# Patient Record
Sex: Female | Born: 1950 | Race: White | Hispanic: No | Marital: Married | Smoking: Never smoker
Health system: Southern US, Community
[De-identification: ages and names within clinical notes are randomized; demographics above are authoritative.]

## PROBLEM LIST (undated history)

## (undated) DIAGNOSIS — Z9889 Other specified postprocedural states: Secondary | ICD-10-CM

## (undated) DIAGNOSIS — K649 Unspecified hemorrhoids: Secondary | ICD-10-CM

## (undated) DIAGNOSIS — M1990 Unspecified osteoarthritis, unspecified site: Secondary | ICD-10-CM

## (undated) DIAGNOSIS — N200 Calculus of kidney: Secondary | ICD-10-CM

## (undated) DIAGNOSIS — R112 Nausea with vomiting, unspecified: Secondary | ICD-10-CM

## (undated) DIAGNOSIS — E7800 Pure hypercholesterolemia, unspecified: Secondary | ICD-10-CM

## (undated) DIAGNOSIS — I499 Cardiac arrhythmia, unspecified: Secondary | ICD-10-CM

## (undated) DIAGNOSIS — E559 Vitamin D deficiency, unspecified: Secondary | ICD-10-CM

## (undated) DIAGNOSIS — T4145XA Adverse effect of unspecified anesthetic, initial encounter: Secondary | ICD-10-CM

## (undated) DIAGNOSIS — K5790 Diverticulosis of intestine, part unspecified, without perforation or abscess without bleeding: Secondary | ICD-10-CM

## (undated) DIAGNOSIS — R739 Hyperglycemia, unspecified: Secondary | ICD-10-CM

## (undated) DIAGNOSIS — T8859XA Other complications of anesthesia, initial encounter: Secondary | ICD-10-CM

## (undated) DIAGNOSIS — R600 Localized edema: Secondary | ICD-10-CM

## (undated) DIAGNOSIS — M810 Age-related osteoporosis without current pathological fracture: Secondary | ICD-10-CM

## (undated) DIAGNOSIS — Z973 Presence of spectacles and contact lenses: Secondary | ICD-10-CM

## (undated) DIAGNOSIS — M51369 Other intervertebral disc degeneration, lumbar region without mention of lumbar back pain or lower extremity pain: Secondary | ICD-10-CM

## (undated) DIAGNOSIS — M5136 Other intervertebral disc degeneration, lumbar region: Secondary | ICD-10-CM

## (undated) DIAGNOSIS — E039 Hypothyroidism, unspecified: Secondary | ICD-10-CM

## (undated) DIAGNOSIS — K219 Gastro-esophageal reflux disease without esophagitis: Secondary | ICD-10-CM

## (undated) DIAGNOSIS — M353 Polymyalgia rheumatica: Secondary | ICD-10-CM

## (undated) DIAGNOSIS — Z974 Presence of external hearing-aid: Secondary | ICD-10-CM

## (undated) DIAGNOSIS — Z87442 Personal history of urinary calculi: Secondary | ICD-10-CM

## (undated) DIAGNOSIS — L309 Dermatitis, unspecified: Secondary | ICD-10-CM

## (undated) HISTORY — PX: COLONOSCOPY: SHX174

## (undated) HISTORY — PX: TONSILLECTOMY: SUR1361

## (undated) HISTORY — PX: ESOPHAGOGASTRODUODENOSCOPY: SHX1529

## (undated) HISTORY — PX: APPENDECTOMY: SHX54

## (undated) HISTORY — PX: BACK SURGERY: SHX140

## (undated) HISTORY — PX: BLEPHAROPLASTY: SUR158

## (undated) HISTORY — PX: VEIN LIGATION: SHX2652

## (undated) HISTORY — DX: Dermatitis, unspecified: L30.9

---

## 1984-01-02 HISTORY — PX: ABDOMINAL HYSTERECTOMY: SHX81

## 1987-01-02 HISTORY — PX: LUMBAR LAMINECTOMY: SHX95

## 2004-09-27 ENCOUNTER — Ambulatory Visit: Payer: Self-pay | Admitting: Internal Medicine

## 2005-10-02 ENCOUNTER — Ambulatory Visit: Payer: Self-pay

## 2005-10-02 ENCOUNTER — Ambulatory Visit: Payer: Self-pay | Admitting: Internal Medicine

## 2005-11-05 ENCOUNTER — Ambulatory Visit: Payer: Self-pay | Admitting: Pain Medicine

## 2005-11-05 ENCOUNTER — Ambulatory Visit: Payer: Self-pay | Admitting: Internal Medicine

## 2005-11-06 ENCOUNTER — Ambulatory Visit: Payer: Self-pay | Admitting: Pain Medicine

## 2005-11-20 ENCOUNTER — Ambulatory Visit: Payer: Self-pay | Admitting: Pain Medicine

## 2005-11-27 ENCOUNTER — Ambulatory Visit: Payer: Self-pay | Admitting: Physician Assistant

## 2005-12-04 ENCOUNTER — Ambulatory Visit: Payer: Self-pay | Admitting: Pain Medicine

## 2005-12-17 ENCOUNTER — Ambulatory Visit: Payer: Self-pay | Admitting: Pain Medicine

## 2006-10-08 ENCOUNTER — Ambulatory Visit: Payer: Self-pay | Admitting: Internal Medicine

## 2007-03-13 ENCOUNTER — Ambulatory Visit: Payer: Self-pay | Admitting: Internal Medicine

## 2007-10-09 ENCOUNTER — Ambulatory Visit: Payer: Self-pay

## 2008-03-17 ENCOUNTER — Ambulatory Visit: Payer: Self-pay | Admitting: Internal Medicine

## 2008-07-27 ENCOUNTER — Ambulatory Visit: Payer: Self-pay | Admitting: Pain Medicine

## 2008-08-10 ENCOUNTER — Ambulatory Visit: Payer: Self-pay | Admitting: Physician Assistant

## 2008-08-24 ENCOUNTER — Ambulatory Visit: Payer: Self-pay | Admitting: Pain Medicine

## 2008-09-07 ENCOUNTER — Ambulatory Visit: Payer: Self-pay | Admitting: Physician Assistant

## 2008-10-05 ENCOUNTER — Other Ambulatory Visit: Payer: Self-pay | Admitting: Internal Medicine

## 2008-10-12 ENCOUNTER — Ambulatory Visit: Payer: Self-pay

## 2009-02-01 ENCOUNTER — Ambulatory Visit: Payer: Self-pay | Admitting: Rheumatology

## 2009-04-05 ENCOUNTER — Other Ambulatory Visit: Payer: Self-pay | Admitting: Internal Medicine

## 2009-10-11 ENCOUNTER — Other Ambulatory Visit: Payer: Self-pay | Admitting: Internal Medicine

## 2009-11-23 ENCOUNTER — Ambulatory Visit: Payer: Self-pay | Admitting: Internal Medicine

## 2009-11-23 ENCOUNTER — Ambulatory Visit: Payer: Self-pay

## 2010-04-17 ENCOUNTER — Other Ambulatory Visit: Payer: Self-pay | Admitting: Internal Medicine

## 2010-11-01 ENCOUNTER — Ambulatory Visit: Payer: Self-pay | Admitting: Internal Medicine

## 2010-11-29 ENCOUNTER — Ambulatory Visit: Payer: Self-pay | Admitting: Internal Medicine

## 2011-02-13 ENCOUNTER — Ambulatory Visit: Payer: Self-pay | Admitting: Rheumatology

## 2011-02-13 LAB — COMPREHENSIVE METABOLIC PANEL
Albumin: 4.1 g/dL (ref 3.4–5.0)
Anion Gap: 4 — ABNORMAL LOW (ref 7–16)
BUN: 17 mg/dL (ref 7–18)
Bilirubin,Total: 0.4 mg/dL (ref 0.2–1.0)
Calcium, Total: 9.2 mg/dL (ref 8.5–10.1)
Co2: 30 mmol/L (ref 21–32)
EGFR (African American): >60
Glucose: 88 mg/dL (ref 65–99)
Osmolality: 279 (ref 275–301)
Potassium: 4.1 mmol/L (ref 3.5–5.1)
SGOT(AST): 25 U/L (ref 15–37)
Total Protein: 7.4 g/dL (ref 6.4–8.2)

## 2011-02-13 LAB — CBC WITH DIFFERENTIAL/PLATELET
Basophil #: 0 10*3/uL (ref 0.0–0.1)
Basophil %: 0.5 %
Eosinophil #: 0.3 10*3/uL (ref 0.0–0.7)
Eosinophil %: 4.4 %
HCT: 42.1 % (ref 35.0–47.0)
HGB: 14 g/dL (ref 12.0–16.0)
Lymphocyte #: 1.8 10*3/uL (ref 1.0–3.6)
Lymphocyte %: 32.1 %
MCH: 31.4 pg (ref 26.0–34.0)
MCHC: 33.3 g/dL (ref 32.0–36.0)
Monocyte #: 0.7 10*3/uL (ref 0.0–0.7)
Neutrophil #: 2.9 10*3/uL (ref 1.4–6.5)
RBC: 4.47 10*6/uL (ref 3.80–5.20)

## 2011-02-13 LAB — SEDIMENTATION RATE: Erythrocyte Sed Rate: 11 mm/hr (ref 0–30)

## 2011-12-05 ENCOUNTER — Ambulatory Visit: Payer: Self-pay | Admitting: Internal Medicine

## 2011-12-06 ENCOUNTER — Ambulatory Visit: Payer: Self-pay | Admitting: Rheumatology

## 2012-01-02 HISTORY — PX: BILATERAL CARPAL TUNNEL RELEASE: SHX6508

## 2012-10-27 ENCOUNTER — Emergency Department: Payer: Self-pay | Admitting: Emergency Medicine

## 2012-10-27 LAB — COMPREHENSIVE METABOLIC PANEL
Alkaline Phosphatase: 102 U/L (ref 50–136)
Anion Gap: 11 (ref 7–16)
Calcium, Total: 9.2 mg/dL (ref 8.5–10.1)
Chloride: 107 mmol/L (ref 98–107)
EGFR (African American): 58 — ABNORMAL LOW
EGFR (Non-African Amer.): 50 — ABNORMAL LOW
Potassium: 3.5 mmol/L (ref 3.5–5.1)
SGOT(AST): 29 U/L (ref 15–37)
SGPT (ALT): 25 U/L (ref 12–78)

## 2012-10-27 LAB — URINALYSIS, COMPLETE
Bilirubin,UR: NEGATIVE
Glucose,UR: NEGATIVE mg/dL (ref 0–75)
Nitrite: NEGATIVE
Ph: 5 (ref 4.5–8.0)
Protein: NEGATIVE

## 2012-10-27 LAB — CBC
HCT: 43.4 % (ref 35.0–47.0)
HGB: 14.7 g/dL (ref 12.0–16.0)
MCH: 31.3 pg (ref 26.0–34.0)
MCV: 93 fL (ref 80–100)
Platelet: 262 10*3/uL (ref 150–440)
RBC: 4.69 10*6/uL (ref 3.80–5.20)
RDW: 13.5 % (ref 11.5–14.5)
WBC: 9.6 10*3/uL (ref 3.6–11.0)

## 2012-10-27 LAB — LIPASE, BLOOD: Lipase: 125 U/L (ref 73–393)

## 2012-11-06 ENCOUNTER — Ambulatory Visit: Payer: Self-pay | Admitting: Urology

## 2012-11-09 ENCOUNTER — Emergency Department: Payer: Self-pay | Admitting: Emergency Medicine

## 2012-11-09 LAB — COMPREHENSIVE METABOLIC PANEL
Alkaline Phosphatase: 131 U/L (ref 50–136)
BUN: 19 mg/dL — ABNORMAL HIGH (ref 7–18)
Bilirubin,Total: 0.4 mg/dL (ref 0.2–1.0)
Calcium, Total: 9.2 mg/dL (ref 8.5–10.1)
Chloride: 105 mmol/L (ref 98–107)
Co2: 27 mmol/L (ref 21–32)
Creatinine: 1.19 mg/dL (ref 0.60–1.30)
EGFR (Non-African Amer.): 49 — ABNORMAL LOW
Glucose: 147 mg/dL — ABNORMAL HIGH (ref 65–99)
Osmolality: 281 (ref 275–301)
Potassium: 3.7 mmol/L (ref 3.5–5.1)
SGOT(AST): 29 U/L (ref 15–37)
SGPT (ALT): 33 U/L (ref 12–78)
Total Protein: 7.8 g/dL (ref 6.4–8.2)

## 2012-11-09 LAB — URINALYSIS, COMPLETE
Glucose,UR: NEGATIVE mg/dL (ref 0–75)
Leukocyte Esterase: NEGATIVE
Nitrite: NEGATIVE
Ph: 5 (ref 4.5–8.0)
Specific Gravity: 1.014 (ref 1.003–1.030)
Squamous Epithelial: 2
WBC UR: 13 /HPF (ref 0–5)

## 2012-11-09 LAB — CBC WITH DIFFERENTIAL/PLATELET
Basophil #: 0 10*3/uL (ref 0.0–0.1)
Basophil %: 0.3 %
Eosinophil #: 0 10*3/uL (ref 0.0–0.7)
HCT: 40.7 % (ref 35.0–47.0)
Lymphocyte #: 1.1 10*3/uL (ref 1.0–3.6)
MCH: 30.5 pg (ref 26.0–34.0)
Monocyte #: 0.6 x10 3/mm (ref 0.2–0.9)
Platelet: 394 10*3/uL (ref 150–440)
RBC: 4.47 10*6/uL (ref 3.80–5.20)
RDW: 13.1 % (ref 11.5–14.5)

## 2012-12-09 ENCOUNTER — Ambulatory Visit: Payer: Self-pay | Admitting: Internal Medicine

## 2013-05-05 ENCOUNTER — Ambulatory Visit: Payer: Self-pay | Admitting: Internal Medicine

## 2013-06-16 ENCOUNTER — Ambulatory Visit: Payer: Self-pay

## 2013-12-28 ENCOUNTER — Ambulatory Visit: Payer: Self-pay | Admitting: Gastroenterology

## 2014-01-01 HISTORY — PX: BLEPHAROPLASTY: SUR158

## 2014-03-02 ENCOUNTER — Ambulatory Visit: Payer: Self-pay | Admitting: Specialist

## 2014-03-02 HISTORY — PX: THUMB ARTHROSCOPY: SHX2509

## 2014-04-06 ENCOUNTER — Ambulatory Visit
Admit: 2014-04-06 | Disposition: A | Discharge status: Home / Self Care | Payer: Self-pay | Attending: Specialist | Admitting: Specialist

## 2014-04-26 ENCOUNTER — Other Ambulatory Visit: Payer: Self-pay | Admitting: Orthopedic Surgery

## 2014-05-02 NOTE — Op Note (Signed)
PATIENT NAME:  Karen Ramirez, Karen Ramirez MR#:  [id number] DATE OF BIRTH:  [birth year]  DATE OF PROCEDURE:  03/02/2014  PREOPERATIVE DIAGNOSIS: Severe degenerative arthritis, carpometacarpal joint, base of left thumb.  POSTOPERATIVE DIAGNOSIS: Severe degenerative arthritis, carpometacarpal joint, base of left thumb.  PROCEDURE PERFORMED: Excisional trapezium arthroplasty, left thumb.   SURGEON: Christophe Louis, M.D.   ANESTHESIA: General.   COMPLICATIONS: None.   TOURNIQUET TIME: 63 minutes.   DESCRIPTION OF PROCEDURE: 2 grams of Ancef were given intravenously prior to the procedure. General anesthesia is induced. The left upper extremity is thoroughly prepped with alcohol and ChloraPrep and draped in standard sterile fashion. The extremity is wrapped out with the Esmarch bandage and pneumatic tourniquet elevated to 250 mmHg. Median nerve block is performed at the wrist using 0.5% plain Marcaine.  Under loupe magnification, standard dorsal incision is made over the first metacarpocarpal joint and the dissection carefully carried down to the dorsal capsule.  Cutaneous nerves and tendons are respected at all times. A longitudinal incision is made in the dorsal capsule and this is carefully reflected to each side and preserved for later repair. Using the dental bur and the rongeur, complete excision of the trapezium was then performed. Careful palpation demonstrates no residual pieces of bone. The wound is thoroughly irrigated multiple times. Three small pieces of Gelfoam are then compressed into the shape of a trapezium and sewn together using 4-0 Mersilene. This was then secured into the hole made by the excision of the trapezium using the 4-0 Mersilene through the flexor tendon and the base of the wound. Subcutaneous tissue was then infiltrated with 0.5% plain Marcaine. The dorsal capsule is then meticulously repaired with 4-0 Mersilene. One subcutaneous 5-0 Vicryl suture is placed and then the skin is  closed with a running subcuticular 3-0 Prolene. Soft bulky dressing with a thumb spica splint is applied. The tourniquet is released. The patient is returned to the recovery room in satisfactory condition having tolerated the procedure quite well.   ____________________________ Lucas Mallow, MD ces:mc D: 03/03/2014 08:03:18 ET T: 03/03/2014 10:34:29 ET JOB#: [id number]  cc: Lucas Mallow, MD, <Dictator> Lucas Mallow MD ELECTRONICALLY SIGNED 03/13/2014 10:06

## 2014-05-05 ENCOUNTER — Encounter (HOSPITAL_BASED_OUTPATIENT_CLINIC_OR_DEPARTMENT_OTHER): Payer: Self-pay | Admitting: *Deleted

## 2014-05-05 NOTE — Progress Notes (Signed)
No cardiac or resp problems

## 2014-05-10 ENCOUNTER — Ambulatory Visit (HOSPITAL_BASED_OUTPATIENT_CLINIC_OR_DEPARTMENT_OTHER)
Admission: RE | Admit: 2014-05-10 | Discharge: 2014-05-10 | Disposition: A | Discharge status: Home / Self Care | Payer: 59 | Source: Ambulatory Visit | Attending: Orthopedic Surgery | Admitting: Orthopedic Surgery

## 2014-05-10 ENCOUNTER — Ambulatory Visit (HOSPITAL_BASED_OUTPATIENT_CLINIC_OR_DEPARTMENT_OTHER): Payer: 59 | Admitting: Anesthesiology

## 2014-05-10 ENCOUNTER — Encounter (HOSPITAL_BASED_OUTPATIENT_CLINIC_OR_DEPARTMENT_OTHER)
Admission: RE | Disposition: A | Discharge status: Home / Self Care | Payer: Self-pay | Source: Ambulatory Visit | Attending: Orthopedic Surgery

## 2014-05-10 ENCOUNTER — Encounter (HOSPITAL_BASED_OUTPATIENT_CLINIC_OR_DEPARTMENT_OTHER): Payer: Self-pay | Admitting: Anesthesiology

## 2014-05-10 DIAGNOSIS — Z90710 Acquired absence of both cervix and uterus: Secondary | ICD-10-CM | POA: Diagnosis not present

## 2014-05-10 DIAGNOSIS — M25511 Pain in right shoulder: Secondary | ICD-10-CM | POA: Diagnosis present

## 2014-05-10 DIAGNOSIS — M94211 Chondromalacia, right shoulder: Secondary | ICD-10-CM | POA: Diagnosis not present

## 2014-05-10 DIAGNOSIS — M5136 Other intervertebral disc degeneration, lumbar region: Secondary | ICD-10-CM | POA: Diagnosis not present

## 2014-05-10 DIAGNOSIS — S46211A Strain of muscle, fascia and tendon of other parts of biceps, right arm, initial encounter: Secondary | ICD-10-CM | POA: Insufficient documentation

## 2014-05-10 DIAGNOSIS — F1099 Alcohol use, unspecified with unspecified alcohol-induced disorder: Secondary | ICD-10-CM | POA: Insufficient documentation

## 2014-05-10 DIAGNOSIS — Z888 Allergy status to other drugs, medicaments and biological substances status: Secondary | ICD-10-CM | POA: Diagnosis not present

## 2014-05-10 DIAGNOSIS — Z881 Allergy status to other antibiotic agents status: Secondary | ICD-10-CM | POA: Diagnosis not present

## 2014-05-10 DIAGNOSIS — E039 Hypothyroidism, unspecified: Secondary | ICD-10-CM | POA: Insufficient documentation

## 2014-05-10 DIAGNOSIS — M65811 Other synovitis and tenosynovitis, right shoulder: Secondary | ICD-10-CM | POA: Diagnosis not present

## 2014-05-10 DIAGNOSIS — M75101 Unspecified rotator cuff tear or rupture of right shoulder, not specified as traumatic: Secondary | ICD-10-CM | POA: Insufficient documentation

## 2014-05-10 DIAGNOSIS — E780 Pure hypercholesterolemia: Secondary | ICD-10-CM | POA: Insufficient documentation

## 2014-05-10 DIAGNOSIS — K219 Gastro-esophageal reflux disease without esophagitis: Secondary | ICD-10-CM | POA: Insufficient documentation

## 2014-05-10 DIAGNOSIS — M19011 Primary osteoarthritis, right shoulder: Secondary | ICD-10-CM | POA: Insufficient documentation

## 2014-05-10 HISTORY — PX: SHOULDER ARTHROSCOPY WITH DISTAL CLAVICLE RESECTION: SHX5675

## 2014-05-10 HISTORY — PX: SHOULDER ARTHROSCOPY WITH BICEPSTENOTOMY: SHX6204

## 2014-05-10 HISTORY — DX: Hypothyroidism, unspecified: E03.9

## 2014-05-10 HISTORY — DX: Pure hypercholesterolemia, unspecified: E78.00

## 2014-05-10 HISTORY — DX: Presence of spectacles and contact lenses: Z97.3

## 2014-05-10 HISTORY — PX: SHOULDER ARTHROSCOPY WITH SUBACROMIAL DECOMPRESSION: SHX5684

## 2014-05-10 HISTORY — DX: Other intervertebral disc degeneration, lumbar region without mention of lumbar back pain or lower extremity pain: M51.369

## 2014-05-10 HISTORY — DX: Gastro-esophageal reflux disease without esophagitis: K21.9

## 2014-05-10 HISTORY — DX: Other intervertebral disc degeneration, lumbar region: M51.36

## 2014-05-10 HISTORY — DX: Unspecified osteoarthritis, unspecified site: M19.90

## 2014-05-10 LAB — POCT HEMOGLOBIN-HEMACUE: HEMOGLOBIN: 15.4 g/dL — AB (ref 12.0–15.0)

## 2014-05-10 SURGERY — SHOULDER ARTHROSCOPY WITH DISTAL CLAVICLE RESECTION
Anesthesia: General | Site: Shoulder | Laterality: Right

## 2014-05-10 MED ORDER — PROPOFOL 10 MG/ML IV BOLUS
INTRAVENOUS | Status: DC | PRN
  Administered 2014-05-10: 200 mg via INTRAVENOUS

## 2014-05-10 MED ORDER — CEFAZOLIN SODIUM-DEXTROSE 2-3 GM-% IV SOLR
INTRAVENOUS | Status: AC
  Filled 2014-05-10: qty 50

## 2014-05-10 MED ORDER — DOCUSATE SODIUM 100 MG PO CAPS: 100.0000 mg | ORAL_CAPSULE | Freq: Three times a day (TID) | ORAL | Status: DC | PRN

## 2014-05-10 MED ORDER — BUPIVACAINE-EPINEPHRINE (PF) 0.5% -1:200000 IJ SOLN
INTRAMUSCULAR | Status: DC | PRN
  Administered 2014-05-10: 23 mL via PERINEURAL

## 2014-05-10 MED ORDER — ACETAMINOPHEN 325 MG PO TABS
ORAL_TABLET | ORAL | Status: AC
  Filled 2014-05-10: qty 1

## 2014-05-10 MED ORDER — CEFAZOLIN SODIUM-DEXTROSE 2-3 GM-% IV SOLR
2.0000 g | INTRAVENOUS | Status: AC
  Administered 2014-05-10: 2 g via INTRAVENOUS

## 2014-05-10 MED ORDER — MEPERIDINE HCL 25 MG/ML IJ SOLN: 6.2500 mg | INTRAMUSCULAR | Status: DC | PRN

## 2014-05-10 MED ORDER — SUCCINYLCHOLINE CHLORIDE 20 MG/ML IJ SOLN
INTRAMUSCULAR | Status: DC | PRN
  Administered 2014-05-10: 100 mg via INTRAVENOUS

## 2014-05-10 MED ORDER — MIDAZOLAM HCL 2 MG/2ML IJ SOLN
INTRAMUSCULAR | Status: AC
  Filled 2014-05-10: qty 2

## 2014-05-10 MED ORDER — DEXAMETHASONE SODIUM PHOSPHATE 4 MG/ML IJ SOLN
INTRAMUSCULAR | Status: DC | PRN
Start: 2014-05-10 — End: 2014-05-10
  Administered 2014-05-10: 10 mg via INTRAVENOUS

## 2014-05-10 MED ORDER — ACETAMINOPHEN 325 MG PO TABS
325.0000 mg | ORAL_TABLET | Freq: Once | ORAL | Status: AC
  Administered 2014-05-10: 325 mg via ORAL

## 2014-05-10 MED ORDER — LACTATED RINGERS IV SOLN
INTRAVENOUS | Status: DC
  Administered 2014-05-10 (×2): via INTRAVENOUS

## 2014-05-10 MED ORDER — FENTANYL CITRATE (PF) 100 MCG/2ML IJ SOLN
INTRAMUSCULAR | Status: AC
  Filled 2014-05-10: qty 2

## 2014-05-10 MED ORDER — FENTANYL CITRATE (PF) 100 MCG/2ML IJ SOLN
INTRAMUSCULAR | Status: AC
  Filled 2014-05-10: qty 6

## 2014-05-10 MED ORDER — FENTANYL CITRATE (PF) 100 MCG/2ML IJ SOLN
50.0000 ug | INTRAMUSCULAR | Status: DC | PRN
  Administered 2014-05-10: 100 ug via INTRAVENOUS

## 2014-05-10 MED ORDER — ONDANSETRON HCL 4 MG/2ML IJ SOLN
INTRAMUSCULAR | Status: DC | PRN
  Administered 2014-05-10: 4 mg via INTRAVENOUS

## 2014-05-10 MED ORDER — HYDROMORPHONE HCL 1 MG/ML IJ SOLN: 0.2500 mg | INTRAMUSCULAR | Status: DC | PRN

## 2014-05-10 MED ORDER — OXYCODONE-ACETAMINOPHEN 5-325 MG PO TABS: 1.0000 | ORAL_TABLET | ORAL | Status: DC | PRN

## 2014-05-10 MED ORDER — POVIDONE-IODINE 7.5 % EX SOLN: Freq: Once | CUTANEOUS | Status: DC

## 2014-05-10 MED ORDER — MIDAZOLAM HCL 2 MG/2ML IJ SOLN
1.0000 mg | INTRAMUSCULAR | Status: DC | PRN
  Administered 2014-05-10: 2 mg via INTRAVENOUS

## 2014-05-10 SURGICAL SUPPLY — 78 items
BENZOIN TINCTURE PRP APPL 2/3 (GAUZE/BANDAGES/DRESSINGS) IMPLANT
BLADE CLIPPER SURG (BLADE) IMPLANT
BLADE SURG 15 STRL LF DISP TIS (BLADE) IMPLANT
BLADE SURG 15 STRL SS (BLADE)
BUR OVAL 4.0 (BURR) ×2 IMPLANT
CANNULA 5.75X71 LONG (CANNULA) ×2 IMPLANT
CANNULA TWIST IN 8.25X7CM (CANNULA) IMPLANT
CHLORAPREP W/TINT 26ML (MISCELLANEOUS) ×2 IMPLANT
DECANTER SPIKE VIAL GLASS SM (MISCELLANEOUS) IMPLANT
DRAPE INCISE IOBAN 66X45 STRL (DRAPES) ×2 IMPLANT
DRAPE STERI 35X30 U-POUCH (DRAPES) ×2 IMPLANT
DRAPE SURG 17X23 STRL (DRAPES) ×2 IMPLANT
DRAPE U 20/CS (DRAPES) ×2 IMPLANT
DRAPE U-SHAPE 47X51 STRL (DRAPES) ×2 IMPLANT
DRAPE U-SHAPE 76X120 STRL (DRAPES) ×4 IMPLANT
DRSG PAD ABDOMINAL 8X10 ST (GAUZE/BANDAGES/DRESSINGS) ×2 IMPLANT
ELECT REM PT RETURN 9FT ADLT (ELECTROSURGICAL) ×2
ELECTRODE REM PT RTRN 9FT ADLT (ELECTROSURGICAL) ×1 IMPLANT
GAUZE SPONGE 4X4 12PLY STRL (GAUZE/BANDAGES/DRESSINGS) ×2 IMPLANT
GAUZE SPONGE 4X4 16PLY XRAY LF (GAUZE/BANDAGES/DRESSINGS) IMPLANT
GAUZE XEROFORM 1X8 LF (GAUZE/BANDAGES/DRESSINGS) ×2 IMPLANT
GLOVE BIO SURGEON STRL SZ 6.5 (GLOVE) ×2 IMPLANT
GLOVE BIO SURGEON STRL SZ7 (GLOVE) ×2 IMPLANT
GLOVE BIO SURGEON STRL SZ7.5 (GLOVE) ×4 IMPLANT
GLOVE BIOGEL PI IND STRL 7.0 (GLOVE) ×2 IMPLANT
GLOVE BIOGEL PI IND STRL 8 (GLOVE) ×2 IMPLANT
GLOVE BIOGEL PI INDICATOR 7.0 (GLOVE) ×2
GLOVE BIOGEL PI INDICATOR 8 (GLOVE) ×2
GOWN STRL REUS W/ TWL LRG LVL3 (GOWN DISPOSABLE) ×2 IMPLANT
GOWN STRL REUS W/ TWL XL LVL3 (GOWN DISPOSABLE) ×1 IMPLANT
GOWN STRL REUS W/TWL LRG LVL3 (GOWN DISPOSABLE) ×2
GOWN STRL REUS W/TWL XL LVL3 (GOWN DISPOSABLE) ×1
LASSO CRESCENT QUICKPASS (SUTURE) IMPLANT
LIQUID BAND (GAUZE/BANDAGES/DRESSINGS) IMPLANT
MANIFOLD NEPTUNE II (INSTRUMENTS) ×2 IMPLANT
NDL SUT 6 .5 CRC .975X.05 MAYO (NEEDLE) IMPLANT
NEEDLE 1/2 CIR CATGUT .05X1.09 (NEEDLE) IMPLANT
NEEDLE MAYO TAPER (NEEDLE)
NEEDLE SCORPION MULTI FIRE (NEEDLE) IMPLANT
NS IRRIG 1000ML POUR BTL (IV SOLUTION) IMPLANT
PACK ARTHROSCOPY DSU (CUSTOM PROCEDURE TRAY) ×2 IMPLANT
PACK BASIN DAY SURGERY FS (CUSTOM PROCEDURE TRAY) ×2 IMPLANT
PENCIL BUTTON HOLSTER BLD 10FT (ELECTRODE) IMPLANT
RESECTOR FULL RADIUS 4.2MM (BLADE) ×2 IMPLANT
SHEET MEDIUM DRAPE 40X70 STRL (DRAPES) IMPLANT
SLEEVE SCD COMPRESS KNEE MED (MISCELLANEOUS) ×2 IMPLANT
SLING ARM IMMOBILIZER MED (SOFTGOODS) IMPLANT
SLING ARM LRG ADULT FOAM STRAP (SOFTGOODS) ×2 IMPLANT
SLING ARM MED ADULT FOAM STRAP (SOFTGOODS) IMPLANT
SLING ARM XL FOAM STRAP (SOFTGOODS) IMPLANT
SPONGE LAP 4X18 X RAY DECT (DISPOSABLE) IMPLANT
STRIP CLOSURE SKIN 1/2X4 (GAUZE/BANDAGES/DRESSINGS) IMPLANT
SUCTION FRAZIER TIP 10 FR DISP (SUCTIONS) IMPLANT
SUPPORT WRAP ARM LG (MISCELLANEOUS) IMPLANT
SUT BONE WAX W31G (SUTURE) IMPLANT
SUT ETHIBOND 2 OS 4 DA (SUTURE) IMPLANT
SUT ETHILON 3 0 PS 1 (SUTURE) ×2 IMPLANT
SUT ETHILON 4 0 PS 2 18 (SUTURE) IMPLANT
SUT FIBERWIRE #2 38 T-5 BLUE (SUTURE)
SUT MNCRL AB 3-0 PS2 18 (SUTURE) IMPLANT
SUT MNCRL AB 4-0 PS2 18 (SUTURE) IMPLANT
SUT PDS AB 0 CT 36 (SUTURE) IMPLANT
SUT PROLENE 3 0 PS 2 (SUTURE) IMPLANT
SUT TIGER TAPE 7 IN WHITE (SUTURE) IMPLANT
SUT VIC AB 0 CT1 27 (SUTURE)
SUT VIC AB 0 CT1 27XBRD ANBCTR (SUTURE) IMPLANT
SUT VIC AB 2-0 SH 27 (SUTURE)
SUT VIC AB 2-0 SH 27XBRD (SUTURE) IMPLANT
SUTURE FIBERWR #2 38 T-5 BLUE (SUTURE) IMPLANT
SYR BULB 3OZ (MISCELLANEOUS) IMPLANT
TAPE FIBER 2MM 7IN #2 BLUE (SUTURE) IMPLANT
TOWEL OR 17X24 6PK STRL BLUE (TOWEL DISPOSABLE) ×2 IMPLANT
TOWEL OR NON WOVEN STRL DISP B (DISPOSABLE) ×2 IMPLANT
TUBE CONNECTING 20X1/4 (TUBING) ×2 IMPLANT
TUBING ARTHROSCOPY IRRIG 16FT (MISCELLANEOUS) ×2 IMPLANT
WAND STAR VAC 90 (SURGICAL WAND) ×2 IMPLANT
WATER STERILE IRR 1000ML POUR (IV SOLUTION) ×2 IMPLANT
YANKAUER SUCT BULB TIP NO VENT (SUCTIONS) IMPLANT

## 2014-05-10 NOTE — Anesthesia Procedure Notes (Addendum)
Anesthesia Regional Block:  Interscalene brachial plexus block  Pre-Anesthetic Checklist: ,, timeout performed, Correct Patient, Correct Site, Correct Laterality, Correct Procedure, Correct Position, site marked, Risks and benefits discussed,  Surgical consent,  Pre-op evaluation,  At surgeon's request and post-op pain management  Laterality: Right and Upper  Prep: chloraprep       Needles:  Injection technique: Single-shot  Needle Type: Echogenic Needle     Needle Length: 5cm 5 cm Needle Gauge: 21 and 21 G    Additional Needles:  Procedures: ultrasound guided (picture in chart) Interscalene brachial plexus block Narrative:  Start time: 05/10/2014 12:16 PM End time: 05/10/2014 12:20 PM Injection made incrementally with aspirations every 5 mL.  Performed by: Personally  Anesthesiologist: CREWS, DAVID   Procedure Name: Intubation Date/Time: 05/10/2014 12:35 PM Performed by: Lyndee Leo Pre-anesthesia Checklist: Patient identified, Emergency Drugs available, Suction available and Patient being monitored Patient Re-evaluated:Patient Re-evaluated prior to inductionOxygen Delivery Method: Circle System Utilized Preoxygenation: Pre-oxygenation with 100% oxygen Intubation Type: IV induction Ventilation: Mask ventilation without difficulty Laryngoscope Size: Mac and 3 Grade View: Grade II Tube type: Oral Tube size: 7.0 mm Number of attempts: 1 Airway Equipment and Method: Stylet and Oral airway Placement Confirmation: ETT inserted through vocal cords under direct vision,  positive ETCO2 and breath sounds checked- equal and bilateral Tube secured with: Tape Dental Injury: Teeth and Oropharynx as per pre-operative assessment

## 2014-05-10 NOTE — Op Note (Addendum)
Procedure(s): SHOULDER ARTHROSCOPY WITH DISTAL CLAVICLE RESECTION DEBRIDEMENT OF ROTATOR CUFF TEAR SHOULDER ARTHROSCOPY WITH BICEPSTENOTOMY SHOULDER ARTHROSCOPY WITH SUBACROMIAL DECOMPRESSION Procedure Note  Karen Ramirez female 64 y.o. 05/10/2014  Procedure(s) and Anesthesia Type:  #1 right shoulder arthroscopic extensive debridement of irreparable rotator cuff tear, biceps tendon tear with tenotomy #2 right shoulder arthroscopic subacromial decompression #3 right shoulder arthroscopic distal clavicle excision  Surgeon(s) and Role:    * Tania Ade, MD - Primary     Surgeon: Nita Sells   Assistants: Jeanmarie Hubert PA-C (Danielle was present and scrubbed throughout the procedure and was essential in positioning, assisting with the camera and instrumentation,, and closure)  Anesthesia: General endotracheal anesthesia with preoperative interscalene block given by the attending anesthesiologist     Procedure Detail   Estimated Blood Loss: Min         Drains: none  Blood Given: none         Specimens: none        Complications:  * No complications entered in OR log *         Disposition: PACU - hemodynamically stable.         Condition: stable    Procedure:   INDICATIONS FOR SURGERY: The patient is 63 y.o. female who has a history of right shoulder pain and dysfunction which is failed conservative management. She has a large irreparable rotator cuff tear and severe synovitis in the joint. She also has symptoms AC joint DJD. She wished to go forward with surgery understanding risks benefits alternatives to the procedure.  OPERATIVE FINDINGS: Examination under anesthesia: No significant stiffness or instability.   DESCRIPTION OF PROCEDURE: The patient was identified in preoperative  holding area where I personally marked the operative site after  verifying site, side, and procedure with the patient. An interscalene block was given by the  attending anesthesiologist the holding area.  The patient was taken back to the operating room where general anesthesia was induced without complication and was placed in the beach-chair position with the back  elevated about 60 degrees and all extremities and head and neck carefully padded and  positioned.   The right upper extremity was then prepped and  draped in a standard sterile fashion. The appropriate time-out  procedure was carried out. The patient did receive IV antibiotics  within 30 minutes of incision.   A small posterior portal incision was made and the arthroscope was introduced into the joint. An anterior portal was then established above the subscapularis using needle localization. Small cannula was placed anteriorly. Diagnostic arthroscopy was then carried out.  She was noted to have severe synovitis throughout the joint space with a large amount of intra-articular fluid. The subscapularis was completely intact and there was some intact infraspinatus but the supraspinatus was completely torn and retracted past the level of the glenoid. The biceps tendon was severely torn proximally involving greater than 50% of the thickness of the tendon and the superior labrum was also torn from its insertion on the superior glenoid. Therefore a large biter was used to release the proximal long head biceps tendon and shaver was used to extensively debride the stump. The ArthroCare was used to extensively debride the synovitis in the joint anterior posterior and superiorly. Glenohumeral joint surfaces were noted to have mild degenerative changes with grade 1 and 2 chondromalacia.  The arthroscope was then introduced into the subacromial space a standard lateral portal was established with needle localization.   The tuberosity was noted to  have a fair amount of residual tendon stump remaining and this was extensively debrided with the shaver and the ArthroCare. Tuberosity was smoothed slightly. The  residual rotator cuff edge was debrided. There is no good definable tendon remaining. The undersurface of the acromion was debrided with the ArthriCare and anterior edge was then smoothed with the bur where a small spur was noted.  The distal clavicle was exposed arthroscopically and the bur was used to take off the undersurface for approximately 8 mm from the lateral portal. The bur was then moved to an anterior portal position to complete the distal clavicle excision resecting about 8 mm of the distal clavicle and a smooth even fashion. This was viewed from anterior and lateral portals and felt to be complete.  The arthroscopic equipment was removed from the joint and the portals were closed with 3-0 nylon in an interrupted fashion. Sterile dressings were then applied including Xeroform 4 x 4's ABDs and tape. The patient was then allowed to awaken from general anesthesia, placed in a sling, transferred to the stretcher and taken to the recovery room in stable condition.   POSTOPERATIVE PLAN: The patient will be discharged home today and will followup in one week for suture removal and wound check.  We will get her into physical therapy right away.

## 2014-05-10 NOTE — Anesthesia Preprocedure Evaluation (Signed)
Anesthesia Evaluation  Patient identified by MRN, date of birth, ID band Patient awake    Reviewed: Allergy & Precautions, NPO status , Patient's Chart, lab work & pertinent test results  Airway Mallampati: I  TM Distance: >3 FB Neck ROM: Full    Dental  (+) Teeth Intact, Dental Advisory Given   Pulmonary  breath sounds clear to auscultation        Cardiovascular Rhythm:Regular Rate:Normal     Neuro/Psych    GI/Hepatic GERD-  Medicated and Controlled,  Endo/Other  Hypothyroidism   Renal/GU      Musculoskeletal   Abdominal   Peds  Hematology   Anesthesia Other Findings   Reproductive/Obstetrics                             Anesthesia Physical Anesthesia Plan  ASA: II  Anesthesia Plan: General   Post-op Pain Management:    Induction: Intravenous  Airway Management Planned: Oral ETT  Additional Equipment:   Intra-op Plan:   Post-operative Plan: Extubation in OR  Informed Consent: I have reviewed the patients History and Physical, chart, labs and discussed the procedure including the risks, benefits and alternatives for the proposed anesthesia with the patient or authorized representative who has indicated his/her understanding and acceptance.   Dental advisory given  Plan Discussed with: CRNA, Anesthesiologist and Surgeon  Anesthesia Plan Comments:         Anesthesia Quick Evaluation

## 2014-05-10 NOTE — H&P (Signed)
Karen Ramirez is an 64 y.o. female.   Chief Complaint: R shoulder pain and dysfunction HPI: R shoulder pain and weakness with massive rotator cuff tear failed nonop tx with activity modification, medication and injections.  Past Medical History  Diagnosis Date  . Hypothyroidism   . GERD (gastroesophageal reflux disease)   . Arthritis   . DDD (degenerative disc disease), lumbar   . Wears glasses   . Hypercholesteremia     Past Surgical History  Procedure Laterality Date  . Abdominal hysterectomy  1986  . Esophagogastroduodenoscopy    . Lumbar laminectomy  1989  . Vein ligation      left  . Colonoscopy    . Thumb arthroscopy  3/16    left  . Blepharoplasty      both eyes    History reviewed. No pertinent family history. Social History:  reports that she has never smoked. She does not have any smokeless tobacco history on file. She reports that she drinks alcohol. She reports that she does not use illicit drugs.  Allergies:  Allergies  Allergen Reactions  . Erythromycin   . Hydroxychloroquine   . Levaquin [Levofloxacin]     Medications Prior to Admission  Medication Sig Dispense Refill  . aspirin 81 MG tablet Take 81 mg by mouth daily.    . celecoxib (CELEBREX) 200 MG capsule Take 200 mg by mouth as needed.    . cholecalciferol (VITAMIN D) 1000 UNITS tablet Take 1,000 Units by mouth daily.    Marland Kitchen levothyroxine (SYNTHROID, LEVOTHROID) 75 MCG tablet Take 75 mcg by mouth daily before breakfast.    . ranitidine (ZANTAC) 150 MG tablet Take 150 mg by mouth 2 (two) times daily.    . simvastatin (ZOCOR) 20 MG tablet Take 20 mg by mouth daily at 6 PM.      Results for orders placed or performed during the hospital encounter of 05/10/14 (from the past 48 hour(s))  Hemoglobin-hemacue, POC     Status: Abnormal   Collection Time: 05/10/14 11:59 AM  Result Value Ref Range   Hemoglobin 15.4 (H) 12.0 - 15.0 g/dL   No results found.  Review of Systems  All other systems  reviewed and are negative.   Blood pressure 136/74, pulse 75, temperature 98 F (36.7 C), temperature source Oral, resp. rate 18, height 5\' 4"  (1.626 m), weight 79.017 kg (174 lb 3.2 oz), SpO2 98 %. Physical Exam  Constitutional: She is oriented to person, place, and time. She appears well-developed and well-nourished.  HENT:  Head: Atraumatic.  Eyes: EOM are normal.  Cardiovascular: Intact distal pulses.   Respiratory: Effort normal.  Musculoskeletal:  Pain and weakness with rotator cuff testing R shoulder  Neurological: She is alert and oriented to person, place, and time.  Skin: Skin is warm and dry.  Psychiatric: She has a normal mood and affect.     Assessment/Plan R shoulder pain and weakness with massive rotator cuff tear failed nonop tx with activity modification, medication and injections. Plan R arth debridement, SAD, DCR, poss biceps tenotomy Risks / benefits of surgery discussed Consent on chart  NPO for OR Preop antibiotics   Maleena Eddleman WILLIAM 05/10/2014, 12:14 PM

## 2014-05-10 NOTE — Anesthesia Postprocedure Evaluation (Signed)
  Anesthesia Post-op Note  Patient: Karen Ramirez  Procedure(s) Performed: Procedure(s): SHOULDER ARTHROSCOPY WITH DISTAL CLAVICLE RESECTION DEBRIDEMENT OF ROTATOR CUFF TEAR (Right) SHOULDER ARTHROSCOPY WITH BICEPSTENOTOMY (Right) SHOULDER ARTHROSCOPY WITH SUBACROMIAL DECOMPRESSION (Right)  Patient Location: PACU  Anesthesia Type: General with regional block for post op pain   Level of Consciousness: awake, alert  and oriented  Airway and Oxygen Therapy: Patient Spontanous Breathing  Post-op Pain: none  Post-op Assessment: Post-op Vital signs reviewed  Post-op Vital Signs: Reviewed  Last Vitals:  Filed Vitals:   05/10/14 1441  BP: 126/80  Pulse: 80  Temp: 36.5 C  Resp: 16    Complications: No apparent anesthesia complications

## 2014-05-10 NOTE — Progress Notes (Signed)
Assisted Dr. Crews with right, ultrasound guided, interscalene  block. Side rails up, monitors on throughout procedure. See vital signs in flow sheet. Tolerated Procedure well. 

## 2014-05-10 NOTE — Transfer of Care (Signed)
Immediate Anesthesia Transfer of Care Note  Patient: Karen Ramirez  Procedure(s) Performed: Procedure(s): SHOULDER ARTHROSCOPY WITH DISTAL CLAVICLE RESECTION DEBRIDEMENT OF ROTATOR CUFF TEAR (Right) SHOULDER ARTHROSCOPY WITH BICEPSTENOTOMY (Right) SHOULDER ARTHROSCOPY WITH SUBACROMIAL DECOMPRESSION (Right)  Patient Location: PACU  Anesthesia Type:GA combined with regional for post-op pain  Level of Consciousness: sedated, patient cooperative, lethargic and responds to stimulation  Airway & Oxygen Therapy: Patient Spontanous Breathing and Patient connected to face mask oxygen  Post-op Assessment: Report given to RN and Post -op Vital signs reviewed and stable  Post vital signs: Reviewed and stable  Last Vitals:  Filed Vitals:   05/10/14 1230  BP: 122/70  Pulse: 88  Temp:   Resp: 16    Complications: No apparent anesthesia complications

## 2014-05-10 NOTE — Discharge Instructions (Signed)
Discharge Instructions after Arthroscopic Shoulder Surgery ° ° °A sling has been provided for you. You may remove the sling after 72 hours. The sling may be worn for your protection, if you are in a crowd.  °Use ice on the shoulder intermittently over the first 48 hours after surgery.  °Pain medication has been prescribed for you.  °Use your medication liberally over the first 48 hours, and then begin to taper your use. You may take Extra Strength Tylenol or Tylenol only in place of the pain pills. DO NOT take ANY nonsteroidal anti-inflammatory pain medications: Advil, Motrin, Ibuprofen, Aleve, Naproxen, or Naprosyn.  °You may remove your dressing after two days.  °You may shower 5 days after surgery. The incision CANNOT get wet prior to 5 days. Simply allow the water to wash over the site and then pat dry. Do not rub the incision. Make sure your axilla (armpit) is completely dry after showering.  °Take one aspirin a day for 2 weeks after surgery, unless you have an aspirin sensitivity/allergy or asthma.  °Three to 5 times each day you should perform assisted overhead reaching and external rotation (outward turning) exercises with the operative arm. Both exercises should be done with the non-operative arm used as the "therapist arm" while the operative arm remains relaxed. Ten of each exercise should be done three to five times each day. ° ° ° °Overhead reach is helping to lift your stiff arm up as high as it will go. To stretch your overhead reach, lie flat on your back, relax, and grasp the wrist of the tight shoulder with your opposite hand. Using the power in your opposite arm, bring the stiff arm up as far as it is comfortable. Start holding it for ten seconds and then work up to where you can hold it for a count of 30. Breathe slowly and deeply while the arm is moved. Repeat this stretch ten times, trying to help the arm up a little higher each time.  ° ° ° ° ° °External rotation is turning the arm out to  the side while your elbow stays close to your body. External rotation is best stretched while you are lying on your back. Hold a cane, yardstick, broom handle, or dowel in both hands. Bend both elbows to a right angle. Use steady, gentle force from your normal arm to rotate the hand of the stiff shoulder out away from your body. Continue the rotation as far as it will go comfortably, holding it there for a count of 10. Repeat this exercise ten times.  ° ° ° °Please call [phone] during normal business hours or [phone] after hours for any problems. Including the following: ° °- excessive redness of the incisions °- drainage for more than 4 days °- fever of more than 101.5 F ° °*Please note that pain medications will not be refilled after hours or on weekends. ° ° ° °Post Anesthesia Home Care Instructions ° °Activity: °Get plenty of rest for the remainder of the day. A responsible adult should stay with you for 24 hours following the procedure.  °For the next 24 hours, DO NOT: °-Drive a car °-Operate machinery °-Drink alcoholic beverages °-Take any medication unless instructed by your physician °-Make any legal decisions or sign important papers. ° °Meals: °Start with liquid foods such as gelatin or soup. Progress to regular foods as tolerated. Avoid greasy, spicy, heavy foods. If nausea and/or vomiting occur, drink only clear liquids until the nausea and/or vomiting subsides. Call   your physician if vomiting continues. ° °Special Instructions/Symptoms: °Your throat may feel dry or sore from the anesthesia or the breathing tube placed in your throat during surgery. If this causes discomfort, gargle with warm salt water. The discomfort should disappear within 24 hours. ° °If you had a scopolamine patch placed behind your ear for the management of post- operative nausea and/or vomiting: ° °1. The medication in the patch is effective for 72 hours, after which it should be removed.  Wrap patch in a tissue and  discard in the trash. Wash hands thoroughly with soap and water. °2. You may remove the patch earlier than 72 hours if you experience unpleasant side effects which may include dry mouth, dizziness or visual disturbances. °3. Avoid touching the patch. Wash your hands with soap and water after contact with the patch. ° ° °  °Regional Anesthesia Blocks ° °1. Numbness or the inability to move the "blocked" extremity may last from 3-48 hours after placement. The length of time depends on the medication injected and your individual response to the medication. If the numbness is not going away after 48 hours, call your surgeon. ° °2. The extremity that is blocked will need to be protected until the numbness is gone and the  Strength has returned. Because you cannot feel it, you will need to take extra care to avoid injury. Because it may be weak, you may have difficulty moving it or using it. You may not know what position it is in without looking at it while the block is in effect. ° °3. For blocks in the legs and feet, returning to weight bearing and walking needs to be done carefully. You will need to wait until the numbness is entirely gone and the strength has returned. You should be able to move your leg and foot normally before you try and bear weight or walk. You will need someone to be with you when you first try to ensure you do not fall and possibly risk injury. ° °4. Bruising and tenderness at the needle site are common side effects and will resolve in a few days. ° °5. Persistent numbness or new problems with movement should be communicated to the surgeon or the [facility] ([phone])/ [facility] ([phone]). °

## 2014-05-11 ENCOUNTER — Encounter (HOSPITAL_BASED_OUTPATIENT_CLINIC_OR_DEPARTMENT_OTHER): Payer: Self-pay | Admitting: Orthopedic Surgery

## 2014-05-19 ENCOUNTER — Ambulatory Visit: Payer: 59 | Attending: Orthopedic Surgery

## 2014-05-19 DIAGNOSIS — M25511 Pain in right shoulder: Secondary | ICD-10-CM | POA: Diagnosis not present

## 2014-05-19 DIAGNOSIS — M25611 Stiffness of right shoulder, not elsewhere classified: Secondary | ICD-10-CM

## 2014-05-19 NOTE — Patient Instructions (Signed)
[employer]: Shoulder flexion AAROM x 5 Shoulder flexion/abd table slides x 10 each scap retraction x 10 Shoulder ER AAROM with cane x10

## 2014-05-19 NOTE — Therapy (Signed)
[place] MAIN [facility] SERVICES [address] [place], [place], [address] Phone: [phone]   Fax:  [phone]  Physical Therapy Evaluation  Patient Details  Name: Karen Ramirez MRN: [id number] Date of Birth: 31-Jul-1950 Referring Provider:  Tania Ade, MD  Encounter Date: 05/19/2014      PT End of Session - 05/19/14 1450    Visit Number 1   Number of Visits 13   Date for PT Re-Evaluation 06/19/14   PT Start Time 1400   PT Stop Time 1440   PT Time Calculation (min) 40 min   Activity Tolerance Patient limited by pain;Patient tolerated treatment well   Behavior During Therapy [facility] for tasks assessed/performed      Past Medical History  Diagnosis Date  . Hypothyroidism   . GERD (gastroesophageal reflux disease)   . Arthritis   . DDD (degenerative disc disease), lumbar   . Wears glasses   . Hypercholesteremia     Past Surgical History  Procedure Laterality Date  . Abdominal hysterectomy  1986  . Esophagogastroduodenoscopy    . Lumbar laminectomy  1989  . Vein ligation      left  . Colonoscopy    . Thumb arthroscopy  3/16    left  . Blepharoplasty      both eyes  . Shoulder arthroscopy with distal clavicle resection Right 05/10/2014    Procedure: SHOULDER ARTHROSCOPY WITH DISTAL CLAVICLE RESECTION DEBRIDEMENT OF ROTATOR CUFF TEAR;  Surgeon: Tania Ade, MD;  Location: [place];  Service: Orthopedics;  Laterality: Right;  . Shoulder arthroscopy with bicepstenotomy Right 05/10/2014    Procedure: SHOULDER ARTHROSCOPY WITH BICEPSTENOTOMY;  Surgeon: Tania Ade, MD;  Location: [place];  Service: Orthopedics;  Laterality: Right;  . Shoulder arthroscopy with subacromial decompression Right 05/10/2014    Procedure: SHOULDER ARTHROSCOPY WITH SUBACROMIAL DECOMPRESSION;  Surgeon: Tania Ade, MD;  Location: [place];  Service: Orthopedics;  Laterality: Right;    There were  no vitals filed for this visit.  Visit Diagnosis:  Pain in joint, shoulder region, right - Plan: PT plan of care cert/re-cert  Shoulder stiffness, right - Plan: PT plan of care cert/re-cert      Subjective Assessment - 05/19/14 1401    Subjective (p) Pt reports having increased R shoulder pain in Jan 2016. pt reports she had an injection which didnt help. 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 pt underwent R shoulder arthroscopy, debridement, distal clavicle excision 05/10/14. She reports no complications. no original injury to shoulder. pt reports she has no numbness/tingling. pt reports she had follow up apt 05/17/14 for  stitches removal.    Patient Stated Goals (p) pt would like to be able to do her hair, over head activities   Pain Score (p) 2    Pain Location (p) Shoulder   Pain Orientation (p) Anterior;Posterior   Pain Descriptors / Indicators (p) Aching;Sore            [facility] PT Assessment - 05/19/14 0001    Assessment   Medical Diagnosis R shoulder arthroscopy   Onset Date 05/10/14   Precautions   Precautions None   Required Braces or Orthoses Sling   Restrictions   Weight Bearing Restrictions No   Balance Screen   Has the patient fallen in the past 6 months No   Has the patient had a decrease in activity level because of a fear  of falling?  No   Is the patient reluctant to leave their home because of a fear of falling?  No   Home Environment   Living Enviornment Private residence   Living Arrangements Spouse/significant other   Home Access Stairs to enter   Entrance Stairs-Number of Steps 5   Entrance Stairs-Rails Can reach both   [place] Two level   Alternate Level Stairs-Number of Steps 12   Prior Function   Level of Independence Independent with homemaking with ambulation;Independent with basic ADLs   Vocation Retired   Leisure walking, shopping   Observation/Other Assessments    Skin Integrity healing scars, no sign of infection   Sensation   Light Touch Appears Intact   ROM / Strength   AROM / PROM / Strength AROM;PROM  deferred strength testing due to post op status   AROM   Overall AROM  Deficits   Overall AROM Comments shoulder flexion 40 deg, abd 60 deg, ER 30 deg, IR to waist   AROM Assessment Site Shoulder   Right/Left Shoulder Right   PROM   Overall PROM Comments shoulder flexion 90 deg, abd 90 deg, ER 45 deg, IR 30 deg   PROM Assessment Site Shoulder   Right/Left Shoulder Right       Therex: Shoulder flexion AAROM in supine x 5 Shoulder ER with cane in sitting x 5 Shoulder table slides flexion/abduction x 5 each scap pinch x 5 Pt required min cues for proper performance                         PT Long Term Goals - 05/19/14 1452    PT LONG TERM GOAL #1   Title pt will demonstrate full PROM of R shoulder    Time 4   Period Weeks   Status New   PT LONG TERM GOAL #2   Title pt will improve active shoulder flexion to 140 and ER to 60 to be able to do her hair.    Time 6   Period Weeks   Status New   PT LONG TERM GOAL #3   Title pt will demonsrate shoulder flexion strength at least 4-/5 to be able to lift light items into cabinet   Time 6   Period Weeks   Status New   PT LONG TERM GOAL #4   Title pt will be able to reach behind back with sufficent IR to undo her bra strap   Time 6   Period Weeks   Status New   PT LONG TERM GOAL #5   Title pt will improve quick dash score by 15 pts indicating signficant functional improvement.    Time 6   Period Weeks   Status New               Plan - 05/19/14 1450    Clinical Impression Statement pt presents doing well s/p R shoulder arthroscopy, debridement, biceps release and distal clavice excision. pt has fair ROM, but limited by pain as expected. pt has no signs of infection and pain is controlled. pt had good performance of HEP today.    Pt will benefit from  skilled therapeutic intervention in order to improve on the following deficits Pain;Decreased strength;Decreased scar mobility;Impaired UE functional use;Postural dysfunction;Impaired flexibility;Improper body mechanics;Decreased range of motion   Rehab Potential Good   PT Frequency 2x / week   PT Duration 6 weeks   PT Treatment/Interventions Aquatic Therapy;Neuromuscular re-education;Scar mobilization;Ultrasound;Passive range  of motion;Patient/family education;Dry needling;Therapeutic activities;Cryotherapy;Electrical Stimulation;Therapeutic exercise;Manual techniques;Moist Heat   PT Next Visit Plan PROM to improve ROM         Problem List There are no active problems to display for this patient. Gorden Harms. Clovis Mankins, PT, DPT [phone]   Kristine Tiley 05/19/2014, 3:01 PM  [place] [facility] MAIN [facility] SERVICES [address] [place], [place], [address] Phone: [phone]   Fax:  [phone]

## 2014-05-24 ENCOUNTER — Ambulatory Visit: Payer: 59

## 2014-05-24 DIAGNOSIS — M25611 Stiffness of right shoulder, not elsewhere classified: Secondary | ICD-10-CM

## 2014-05-24 DIAGNOSIS — M25511 Pain in right shoulder: Secondary | ICD-10-CM

## 2014-05-24 NOTE — Therapy (Signed)
[place] MAIN [facility] SERVICES [address] [place], [place], [address] Phone: [phone]   Fax:  [phone]  Physical Therapy Treatment  Patient Details  Name: Karen Ramirez MRN: [id number] Date of Birth: Sep 17, 1950 Referring Provider:  Tania Ade, MD  Encounter Date: 05/24/2014      PT End of Session - 05/24/14 1547    Visit Number 2   Number of Visits 13   Date for PT Re-Evaluation 06/19/14   PT Start Time 1435   PT Stop Time 6962   PT Time Calculation (min) 55 min   Activity Tolerance Patient limited by pain;Patient tolerated treatment well   Behavior During Therapy [facility] for tasks assessed/performed      Past Medical History  Diagnosis Date  . Hypothyroidism   . GERD (gastroesophageal reflux disease)   . Arthritis   . DDD (degenerative disc disease), lumbar   . Wears glasses   . Hypercholesteremia     Past Surgical History  Procedure Laterality Date  . Abdominal hysterectomy  1986  . Esophagogastroduodenoscopy    . Lumbar laminectomy  1989  . Vein ligation      left  . Colonoscopy    . Thumb arthroscopy  3/16    left  . Blepharoplasty      both eyes  . Shoulder arthroscopy with distal clavicle resection Right 05/10/2014    Procedure: SHOULDER ARTHROSCOPY WITH DISTAL CLAVICLE RESECTION DEBRIDEMENT OF ROTATOR CUFF TEAR;  Surgeon: Tania Ade, MD;  Location: [place];  Service: Orthopedics;  Laterality: Right;  . Shoulder arthroscopy with bicepstenotomy Right 05/10/2014    Procedure: SHOULDER ARTHROSCOPY WITH BICEPSTENOTOMY;  Surgeon: Tania Ade, MD;  Location: [place];  Service: Orthopedics;  Laterality: Right;  . Shoulder arthroscopy with subacromial decompression Right 05/10/2014    Procedure: SHOULDER ARTHROSCOPY WITH SUBACROMIAL DECOMPRESSION;  Surgeon: Tania Ade, MD;  Location: [place];  Service: Orthopedics;  Laterality: Right;    There were  no vitals filed for this visit.  Visit Diagnosis:  Shoulder stiffness, right  Pain in joint, shoulder region, right      Subjective Assessment - 05/24/14 1539    Subjective pt reports 4/10 R shoulder pain and some elbow pain. pt reports compliance with HEP without issue. she has discontinued use of the sling.    Patient Stated Goals pt would like to be able to do her hair, over head activities   Pain Score 4    Pain Location Arm   Pain Orientation Right   Pain Descriptors / Indicators Aching                         [facility] Adult PT Treatment/Exercise - 05/24/14 0001    Exercises   Exercises Shoulder   Shoulder Exercises: Supine   Other Supine Exercises shoulder flexion AAROM with stick x 10   Shoulder Exercises: Seated   Other Seated Exercises pullies flexion/abduction AAROM x 10 each way   Shoulder Exercises: Sidelying   External Rotation Weight (lbs) 0   External Rotation Limitations strength/ pain/ pt performed ER AAROM x 10   Other Sidelying Exercises D2 scapular AROM 2x10   Modalities   Modalities Cryotherapy   Cryotherapy   Number Minutes Cryotherapy 12 Minutes   Cryotherapy Location Shoulder   Type of Cryotherapy Ice pack   Manual Therapy   Manual Therapy Joint mobilization;Soft tissue mobilization   Joint Mobilization proximal sternoclavicular joint mobs grade  1-2 3 bouts of 20s, distal AC joint mobs AP/PA grade 1 2 bouts of 20s, AP/inferior GH joint glides grade 1-2 3 bouts of 20-30s each   Soft tissue mobilization STM to distal biceps / insertion site                PT Education - 05/24/14 1547    Education provided Yes   Education Details hold exercises until tomorrow   Person(s) Educated Patient   Methods Explanation   Comprehension Verbalized understanding             PT Long Term Goals - 05/24/14 1547    PT LONG TERM GOAL #1   Title pt will demonstrate full PROM of R shoulder    Time 4   Period Weeks   Status New   PT  LONG TERM GOAL #2   Title pt will improve active shoulder flexion to 140 and ER to 60 to be able to do her hair.    Time 6   Period Weeks   Status New   PT LONG TERM GOAL #3   Title pt will demonsrate shoulder flexion strength at least 4-/5 to be able to lift light items into cabinet   Time 6   Period Weeks   Status New   PT LONG TERM GOAL #4   Title pt will be able to reach behind back with sufficent IR to undo her bra strap   Time 6   Period Weeks   Status New   PT LONG TERM GOAL #5   Title pt will improve quick dash score by 15 pts indicating signficant functional improvement.    Time 6   Period Weeks   Status New               Plan - 05/24/14 1547    Clinical Impression Statement pt reports having more pain today, does have quite a bit of guarding during exercises, needing cues. pt advised to hold exercises until tomorrow or until her pain is less. pt reported having less pain after session. did not progress HEP today. pt has pain especially at [facility] joint. also has some elbow pain soft tissue tightness in biceps and wrist extensors. Pt did have improved PROM of shoulder into flexion/abd >90 deg following manual therapy. Pt does need cues to minimize guarding during manual therapy and therex as she tends to guard and elevate the shoulder.    Pt will benefit from skilled therapeutic intervention in order to improve on the following deficits Pain;Decreased strength;Decreased scar mobility;Impaired UE functional use;Postural dysfunction;Impaired flexibility;Improper body mechanics;Decreased range of motion   Rehab Potential Good   PT Frequency 2x / week   PT Duration 6 weeks   PT Treatment/Interventions Aquatic Therapy;Neuromuscular re-education;Scar mobilization;Ultrasound;Passive range of motion;Patient/family education;Dry needling;Therapeutic activities;Cryotherapy;Electrical Stimulation;Therapeutic exercise;Manual techniques;Moist Heat   PT Next Visit Plan PROM to improve ROM         Problem List There are no active problems to display for this patient.  Gorden Harms. Diora Bellizzi, PT, DPT [phone]  Izea Livolsi 05/24/2014, 3:50 PM  [place] MAIN [facility] SERVICES [address] [place], [place], [address] Phone: [phone]   Fax:  [phone]

## 2014-05-27 ENCOUNTER — Ambulatory Visit: Payer: 59

## 2014-05-27 DIAGNOSIS — M25511 Pain in right shoulder: Secondary | ICD-10-CM | POA: Diagnosis not present

## 2014-05-27 DIAGNOSIS — M25611 Stiffness of right shoulder, not elsewhere classified: Secondary | ICD-10-CM

## 2014-05-27 NOTE — Patient Instructions (Signed)
[email or web address] Supine shoulder ABCs Shoulder IR/ER isometrics 5s x 10 Shoulder AAROM flexion using yellow elastic x 10 Shoulder low row with yellow elastic 2x10

## 2014-05-27 NOTE — Therapy (Signed)
[place] MAIN [facility] SERVICES [address] [place], [place], [address] Phone: [phone]   Fax:  [phone]  Physical Therapy Treatment  Patient Details  Name: Karen Ramirez MRN: [id number] Date of Birth: [birth year] Referring Provider:  Tania Ade, MD  Encounter Date: 05/27/2014      PT End of Session - 05/27/14 1350    Visit Number 3   Number of Visits 13   Date for PT Re-Evaluation 06/19/14   PT Start Time 1118   PT Stop Time 1155   PT Time Calculation (min) 37 min   Activity Tolerance Patient limited by pain;Patient tolerated treatment well   Behavior During Therapy [facility] for tasks assessed/performed      Past Medical History  Diagnosis Date  . Hypothyroidism   . GERD (gastroesophageal reflux disease)   . Arthritis   . DDD (degenerative disc disease), lumbar   . Wears glasses   . Hypercholesteremia     Past Surgical History  Procedure Laterality Date  . Abdominal hysterectomy  1986  . Esophagogastroduodenoscopy    . Lumbar laminectomy  1989  . Vein ligation      left  . Colonoscopy    . Thumb arthroscopy  3/16    left  . Blepharoplasty      both eyes  . Shoulder arthroscopy with distal clavicle resection Right 05/10/2014    Procedure: SHOULDER ARTHROSCOPY WITH DISTAL CLAVICLE RESECTION DEBRIDEMENT OF ROTATOR CUFF TEAR;  Surgeon: Tania Ade, MD;  Location: [place];  Service: Orthopedics;  Laterality: Right;  . Shoulder arthroscopy with bicepstenotomy Right 05/10/2014    Procedure: SHOULDER ARTHROSCOPY WITH BICEPSTENOTOMY;  Surgeon: Tania Ade, MD;  Location: [place];  Service: Orthopedics;  Laterality: Right;  . Shoulder arthroscopy with subacromial decompression Right 05/10/2014    Procedure: SHOULDER ARTHROSCOPY WITH SUBACROMIAL DECOMPRESSION;  Surgeon: Tania Ade, MD;  Location: [place];  Service: Orthopedics;  Laterality: Right;    There were  no vitals filed for this visit.  Visit Diagnosis:  Pain in joint, shoulder region, right  Shoulder stiffness, right      Subjective Assessment - 05/27/14 1348    Subjective pt reports her R shoulder is a little better.    Patient Stated Goals pt would like to be able to do her hair, over head activities   Pain Score 3    Pain Location Arm   Pain Orientation Right   Pain Descriptors / Indicators Aching   Pain Type Surgical pain       Therex: UBE x 3 min no charge warm up pullies into flexion / abduction x 20 each AAROM Shoulder AAROM into flexion with yellow band x 10 Low row yellow band 2x10 Shoulder manual resisted isometrics in supine 5s x 10 each ER/IR Supine ABCs A-Z sidelying D2 scapular AROM x 20 sidelying shoulder flexion AAROM x10 pt required min-mod cues to minimize shoulder elevation and for proper scapular engagement/positioning.     ice to R shoulder in supine x 10 min                    PT Education - 05/27/14 1349    Education provided Yes   Education Details pt asked how long her arm would be hurting: pt explained that post op pain is normal and as long as she is gradually improving not to worry, and it is not possible to predict when her shoulder will no longer hurt. pt  agrees and thinks she is progressing    Person(s) Educated Patient   Methods Explanation   Comprehension Verbalized understanding             PT Long Term Goals - 05/24/14 1547    PT LONG TERM GOAL #1   Title pt will demonstrate full PROM of R shoulder    Time 4   Period Weeks   Status New   PT LONG TERM GOAL #2   Title pt will improve active shoulder flexion to 140 and ER to 60 to be able to do her hair.    Time 6   Period Weeks   Status New   PT LONG TERM GOAL #3   Title pt will demonsrate shoulder flexion strength at least 4-/5 to be able to lift light items into cabinet   Time 6   Period Weeks   Status New   PT LONG TERM GOAL #4   Title pt will be  able to reach behind back with sufficent IR to undo her bra strap   Time 6   Period Weeks   Status New   PT LONG TERM GOAL #5   Title pt will improve quick dash score by 15 pts indicating signficant functional improvement.    Time 6   Period Weeks   Status New               Plan - 05/27/14 1351    Clinical Impression Statement pt progressing gradually, she is having a little less pain, a little more function and was able to progress therex with minimal pain.    Pt will benefit from skilled therapeutic intervention in order to improve on the following deficits Pain;Decreased strength;Decreased scar mobility;Impaired UE functional use;Postural dysfunction;Impaired flexibility;Improper body mechanics;Decreased range of motion   Rehab Potential Good   PT Frequency 2x / week   PT Duration 6 weeks   PT Treatment/Interventions Aquatic Therapy;Neuromuscular re-education;Scar mobilization;Ultrasound;Passive range of motion;Patient/family education;Dry needling;Therapeutic activities;Cryotherapy;Electrical Stimulation;Therapeutic exercise;Manual techniques;Moist Heat   PT Next Visit Plan PROM to improve ROM        Problem List There are no active problems to display for this patient.  Karen Ramirez, PT, DPT [phone]  Karen Ramirez 05/27/2014, 1:53 PM  [place] [facility] MAIN [facility] SERVICES [address] [place], [place], [address] Phone: [phone]   Fax:  [phone]

## 2014-06-01 ENCOUNTER — Ambulatory Visit: Payer: 59

## 2014-06-01 DIAGNOSIS — M25511 Pain in right shoulder: Secondary | ICD-10-CM | POA: Diagnosis not present

## 2014-06-01 DIAGNOSIS — M25611 Stiffness of right shoulder, not elsewhere classified: Secondary | ICD-10-CM

## 2014-06-01 NOTE — Therapy (Addendum)
[place] MAIN [facility] SERVICES [address] [place], [place], [address] Phone: [phone]   Fax:  [phone]  Physical Therapy Treatment  Patient Details  Name: Karen Ramirez MRN: [id number] Date of Birth: 04-30-1950 Referring Provider:  Adin Hector, MD  Encounter Date: 06/01/2014      PT End of Session - 06/01/14 1808    Visit Number 5   Number of Visits 13   Date for PT Re-Evaluation 06/19/14   PT Start Time 9675   PT Stop Time 1805   PT Time Calculation (min) 60 min   Activity Tolerance Patient limited by pain;Patient tolerated treatment well   Behavior During Therapy [facility] for tasks assessed/performed      Past Medical History  Diagnosis Date  . Hypothyroidism   . GERD (gastroesophageal reflux disease)   . Arthritis   . DDD (degenerative disc disease), lumbar   . Wears glasses   . Hypercholesteremia     Past Surgical History  Procedure Laterality Date  . Abdominal hysterectomy  1986  . Esophagogastroduodenoscopy    . Lumbar laminectomy  1989  . Vein ligation      left  . Colonoscopy    . Thumb arthroscopy  3/16    left  . Blepharoplasty      both eyes  . Shoulder arthroscopy with distal clavicle resection Right 05/10/2014    Procedure: SHOULDER ARTHROSCOPY WITH DISTAL CLAVICLE RESECTION DEBRIDEMENT OF ROTATOR CUFF TEAR;  Surgeon: Tania Ade, MD;  Location: [place];  Service: Orthopedics;  Laterality: Right;  . Shoulder arthroscopy with bicepstenotomy Right 05/10/2014    Procedure: SHOULDER ARTHROSCOPY WITH BICEPSTENOTOMY;  Surgeon: Tania Ade, MD;  Location: [place];  Service: Orthopedics;  Laterality: Right;  . Shoulder arthroscopy with subacromial decompression Right 05/10/2014    Procedure: SHOULDER ARTHROSCOPY WITH SUBACROMIAL DECOMPRESSION;  Surgeon: Tania Ade, MD;  Location: [place];  Service: Orthopedics;  Laterality: Right;    There were  no vitals filed for this visit.  Visit Diagnosis:  Pain in joint, shoulder region, right  Shoulder stiffness, right      Subjective Assessment - 06/01/14 0837    Subjective pt reports her R shoulder did pretty good over the weekend, but is more sore today for some reason. pt also c/o swelling in the R arm.    Patient Stated Goals pt would like to be able to do her hair, over head activities   Pain Score 3    Pain Location Arm   Pain Orientation Right     Treatment: Therex: UBE x 3 min L 0 no charge warm up Isometrics 5s x 10 of : shoulder flexion, extension, adduction, abduction, IR/ER Wall slide x 10 Prone row 2x10 Prone shoulder extension 2x10 Prone horizontal abudction x 10 Serratus punch 2x10 Supine ABCs A-Z Pt needs min --mod verbal and tactile cues for proper exercise performance.    Manual therapy: Grade 2 GH glides AP and inferiorly 30s x 2 each  STM to biceps and deltoid, efflurage distal to proximal and muscle stripping, palpable trigger points, pt cued to relax during manual therapy kinesiotape to R shoulder for edema reduction Edema measurement: forearm +1cm R vs L Upper arm: 31cm vs 34cm L to R Session followed by ice to R shoulder. X 8 min  PT Education - 06/02/14 [phone]    Education provided Yes   Education Details remove K tape if she starts itching, elevate R arm to help with swelling    Person(s) Educated Patient   Methods Explanation   Comprehension Verbalized understanding             PT Long Term Goals - 05/24/14 1547    PT LONG TERM GOAL #1   Title pt will demonstrate full PROM of R shoulder    Time 4   Period Weeks   Status New   PT LONG TERM GOAL #2   Title pt will improve active shoulder flexion to 140 and ER to 60 to be able to do her hair.    Time 6   Period Weeks   Status New   PT LONG TERM GOAL #3   Title pt will demonsrate shoulder flexion strength at least 4-/5 to be able to  lift light items into cabinet   Time 6   Period Weeks   Status New   PT LONG TERM GOAL #4   Title pt will be able to reach behind back with sufficent IR to undo her bra strap   Time 6   Period Weeks   Status New   PT LONG TERM GOAL #5   Title pt will improve quick dash score by 15 pts indicating signficant functional improvement.    Time 6   Period Weeks   Status New               Plan - 06/01/14 1751    Clinical Impression Statement progressed scapular strengthening today without issue. pt has pain at mid range flexion of the shoulder in both supine and AAROM in standing. pt does have trigger points palpated in the biceps and deltoid, some relieft with STM today, will reassess next visit  and treat as indicated. K tape applied post treatment for reduction in edema.    Pt will benefit from skilled therapeutic intervention in order to improve on the following deficits Pain;Decreased strength;Decreased scar mobility;Impaired UE functional use;Postural dysfunction;Impaired flexibility;Improper body mechanics;Decreased range of motion   Rehab Potential Good   PT Frequency 2x / week   PT Duration 6 weeks   PT Treatment/Interventions Aquatic Therapy;Neuromuscular re-education;Scar mobilization;Ultrasound;Passive range of motion;Patient/family education;Dry needling;Therapeutic activities;Cryotherapy;Electrical Stimulation;Therapeutic exercise;Manual techniques;Moist Heat   PT Next Visit Plan reasses soft tissue, treat as indicated, possibly trigger point dry needling.         Problem List There are no active problems to display for this patient.  Gorden Harms. Mccauley Diehl, PT, DPT [phone]  Augusto Deckman 06/02/2014, 8:45 AM  [place] MAIN [facility] SERVICES [address] [place], [place], [address] Phone: [phone]   Fax:  [phone]

## 2014-06-03 ENCOUNTER — Ambulatory Visit: Payer: 59 | Attending: Orthopedic Surgery

## 2014-06-03 DIAGNOSIS — M25611 Stiffness of right shoulder, not elsewhere classified: Secondary | ICD-10-CM | POA: Diagnosis not present

## 2014-06-03 DIAGNOSIS — M25511 Pain in right shoulder: Secondary | ICD-10-CM | POA: Insufficient documentation

## 2014-06-03 NOTE — Therapy (Signed)
[place] MAIN [facility] SERVICES [address] [place], [place], [address] Phone: [phone]   Fax:  [phone]  Physical Therapy Treatment  Patient Details  Name: Karen Ramirez MRN: [id number] Date of Birth: June 11, 1950 Referring Provider:  Tania Ade, MD  Encounter Date: 06/03/2014      PT End of Session - 06/03/14 1722    Visit Number 6   Number of Visits 13   Date for PT Re-Evaluation 06/19/14   PT Start Time 1120   PT Stop Time 1200   PT Time Calculation (min) 40 min   Activity Tolerance Patient limited by pain;Patient tolerated treatment well   Behavior During Therapy [facility] for tasks assessed/performed      Past Medical History  Diagnosis Date  . Hypothyroidism   . GERD (gastroesophageal reflux disease)   . Arthritis   . DDD (degenerative disc disease), lumbar   . Wears glasses   . Hypercholesteremia     Past Surgical History  Procedure Laterality Date  . Abdominal hysterectomy  1986  . Esophagogastroduodenoscopy    . Lumbar laminectomy  1989  . Vein ligation      left  . Colonoscopy    . Thumb arthroscopy  3/16    left  . Blepharoplasty      both eyes  . Shoulder arthroscopy with distal clavicle resection Right 05/10/2014    Procedure: SHOULDER ARTHROSCOPY WITH DISTAL CLAVICLE RESECTION DEBRIDEMENT OF ROTATOR CUFF TEAR;  Surgeon: Tania Ade, MD;  Location: [place];  Service: Orthopedics;  Laterality: Right;  . Shoulder arthroscopy with bicepstenotomy Right 05/10/2014    Procedure: SHOULDER ARTHROSCOPY WITH BICEPSTENOTOMY;  Surgeon: Tania Ade, MD;  Location: [place];  Service: Orthopedics;  Laterality: Right;  . Shoulder arthroscopy with subacromial decompression Right 05/10/2014    Procedure: SHOULDER ARTHROSCOPY WITH SUBACROMIAL DECOMPRESSION;  Surgeon: Tania Ade, MD;  Location: [place];  Service: Orthopedics;  Laterality: Right;    There were  no vitals filed for this visit.  Visit Diagnosis:  Pain in joint, shoulder region, right  Shoulder stiffness, right      Subjective Assessment - 06/03/14 1706    Subjective pt reports her R shoulder feels a lot better since last visit.    Currently in Pain? Yes   Pain Score 2    Pain Location Shoulder   Pain Orientation Right   Pain Descriptors / Indicators Aching         Therex: Supine ABCs A-Z Shoulder isometrics in all directions with manual resist 5s x 10 Prone shoulder row, ext, horiz abd, and flexion 2x 10 each  Manual therapy: STM to biceps, deltoid Patient received dry needling therapy education and acknowledged understanding of risks and benefits of dry needling therapy prior to receiving treatment. Patient voiced understanding of treatment options and elected to proceed with dry needling therapy.  Trigger point dry needling to mid deltoid in sidelying , no LTR, but pt did report a deep ach Followed by STM and deltoid stretching.                              PT Long Term Goals - 05/24/14 1547    PT LONG TERM GOAL #1   Title pt will demonstrate full PROM of R shoulder    Time 4   Period Weeks   Status New   PT LONG TERM GOAL #2   Title pt  will improve active shoulder flexion to 140 and ER to 60 to be able to do her hair.    Time 6   Period Weeks   Status New   PT LONG TERM GOAL #3   Title pt will demonsrate shoulder flexion strength at least 4-/5 to be able to lift light items into cabinet   Time 6   Period Weeks   Status New   PT LONG TERM GOAL #4   Title pt will be able to reach behind back with sufficent IR to undo her bra strap   Time 6   Period Weeks   Status New   PT LONG TERM GOAL #5   Title pt will improve quick dash score by 15 pts indicating signficant functional improvement.    Time 6   Period Weeks   Status New               Plan - 06/03/14 1722    Clinical Impression Statement pt did not have abnormal  or adverse effects from TrP DN, she reported normal deep ache and sorness. pt is responding well to soft tissue work and having less pain today. continuing with gentle strengthening as tolerated.    Pt will benefit from skilled therapeutic intervention in order to improve on the following deficits Pain;Decreased strength;Decreased scar mobility;Impaired UE functional use;Postural dysfunction;Impaired flexibility;Improper body mechanics;Decreased range of motion   Rehab Potential Good   PT Frequency 2x / week   PT Duration 6 weeks   PT Treatment/Interventions Aquatic Therapy;Neuromuscular re-education;Scar mobilization;Ultrasound;Passive range of motion;Patient/family education;Dry needling;Therapeutic activities;Cryotherapy;Electrical Stimulation;Therapeutic exercise;Manual techniques;Moist Heat   PT Next Visit Plan reasses soft tissue, treat as indicated, possibly trigger point dry needling.         Problem List There are no active problems to display for this patient.  Gorden Harms. Wally Shevchenko, PT, DPT [phone]  Cheston Coury 06/03/2014, 5:27 PM  [place] [facility] MAIN [facility] SERVICES [address] [place], [place], [address] Phone: [phone]   Fax:  [phone]

## 2014-06-07 ENCOUNTER — Ambulatory Visit: Payer: 59 | Attending: Orthopedic Surgery

## 2014-06-07 DIAGNOSIS — Z5189 Encounter for other specified aftercare: Secondary | ICD-10-CM | POA: Diagnosis present

## 2014-06-07 DIAGNOSIS — M25611 Stiffness of right shoulder, not elsewhere classified: Secondary | ICD-10-CM

## 2014-06-07 DIAGNOSIS — M25511 Pain in right shoulder: Secondary | ICD-10-CM

## 2014-06-07 NOTE — Therapy (Signed)
[place] MAIN [facility] SERVICES [address] [place], [place], [address] Phone: [phone]   Fax:  [phone]  Physical Therapy Treatment  Patient Details  Name: Karen Ramirez MRN: [id number] Date of Birth: [birth year] Referring Provider:  Tania Ade, MD  Encounter Date: 06/07/2014      PT End of Session - 06/07/14 1654    Visit Number 7   Number of Visits 13   Date for PT Re-Evaluation 06/19/14   PT Start Time 1400   PT Stop Time 1450   PT Time Calculation (min) 50 min   Activity Tolerance Patient limited by pain;Patient tolerated treatment well   Behavior During Therapy [facility] for tasks assessed/performed      Past Medical History  Diagnosis Date  . Hypothyroidism   . GERD (gastroesophageal reflux disease)   . Arthritis   . DDD (degenerative disc disease), lumbar   . Wears glasses   . Hypercholesteremia     Past Surgical History  Procedure Laterality Date  . Abdominal hysterectomy  1986  . Esophagogastroduodenoscopy    . Lumbar laminectomy  1989  . Vein ligation      left  . Colonoscopy    . Thumb arthroscopy  3/16    left  . Blepharoplasty      both eyes  . Shoulder arthroscopy with distal clavicle resection Right 05/10/2014    Procedure: SHOULDER ARTHROSCOPY WITH DISTAL CLAVICLE RESECTION DEBRIDEMENT OF ROTATOR CUFF TEAR;  Surgeon: Tania Ade, MD;  Location: [place];  Service: Orthopedics;  Laterality: Right;  . Shoulder arthroscopy with bicepstenotomy Right 05/10/2014    Procedure: SHOULDER ARTHROSCOPY WITH BICEPSTENOTOMY;  Surgeon: Tania Ade, MD;  Location: [place];  Service: Orthopedics;  Laterality: Right;  . Shoulder arthroscopy with subacromial decompression Right 05/10/2014    Procedure: SHOULDER ARTHROSCOPY WITH SUBACROMIAL DECOMPRESSION;  Surgeon: Tania Ade, MD;  Location: [place];  Service: Orthopedics;  Laterality: Right;    There were  no vitals filed for this visit.  Visit Diagnosis:  Pain in joint, shoulder region, right  Shoulder stiffness, right      Subjective Assessment - 06/07/14 1652    Subjective "I feel about the same as last time."   Patient Stated Goals pt would like to be able to do her hair, over head activities   Currently in Pain? Yes   Pain Score 3    Pain Location Shoulder   Pain Orientation Right   Pain Descriptors / Indicators Aching   Pain Type Surgical pain     Therex: Prone shoulder row (1lb), ext, horiz abd,  2x 10 each Low row, yellow band 2x10 Shoulder IR, yellow band 2x10 Finger ladder shoulder AAROM flexion/abduciton x 10 each Supine shoulder circles x 10 CW/CCW at 120, 90 and 80 deg  Supine shoulder flexion AAROM with PT assist x 5 Pt requires min-mod cues for proper exercise performance and to minimize shoulder shrug compensation. Pt has mild increased pain especially with initial shoulder flexion active motion   Cryotherapy x 8 min following therex in supine to R shoulder                                  PT Long Term Goals - 05/24/14 1547    PT LONG TERM GOAL #1   Title pt will demonstrate full PROM of R shoulder    Time 4   Period  Weeks   Status New   PT LONG TERM GOAL #2   Title pt will improve active shoulder flexion to 140 and ER to 60 to be able to do her hair.    Time 6   Period Weeks   Status New   PT LONG TERM GOAL #3   Title pt will demonsrate shoulder flexion strength at least 4-/5 to be able to lift light items into cabinet   Time 6   Period Weeks   Status New   PT LONG TERM GOAL #4   Title pt will be able to reach behind back with sufficent IR to undo her bra strap   Time 6   Period Weeks   Status New   PT LONG TERM GOAL #5   Title pt will improve quick dash score by 15 pts indicating signficant functional improvement.    Time 6   Period Weeks   Status New               Plan - 06/07/14 1655    Clinical  Impression Statement pt did well with progression of therex including progression of AAROM to gravity dependent and additing weight to scapular strengthening. pt also able to tolerate more activity and is progressing well. will assess progress towards goals next visit.    Pt will benefit from skilled therapeutic intervention in order to improve on the following deficits Pain;Decreased strength;Decreased scar mobility;Impaired UE functional use;Postural dysfunction;Impaired flexibility;Improper body mechanics;Decreased range of motion   Rehab Potential Good   PT Frequency 2x / week   PT Duration 6 weeks   PT Treatment/Interventions Aquatic Therapy;Neuromuscular re-education;Scar mobilization;Ultrasound;Passive range of motion;Patient/family education;Dry needling;Therapeutic activities;Cryotherapy;Electrical Stimulation;Therapeutic exercise;Manual techniques;Moist Heat   PT Next Visit Plan reasses soft tissue, treat as indicated, possibly trigger point dry needling.         Problem List There are no active problems to display for this patient.  Gorden Harms. Aaliyana Fredericks, PT, DPT [phone]     Cherrill Scrima 06/07/2014, 4:59 PM  [place] [facility] [place] MAIN [facility] SERVICES [address] [place], [place], [address] Phone: [phone]   Fax:  [phone]

## 2014-06-07 NOTE — Patient Instructions (Signed)
Progress HEP to do wall slide instead of table slides

## 2014-06-14 ENCOUNTER — Ambulatory Visit: Payer: 59

## 2014-06-14 DIAGNOSIS — M25511 Pain in right shoulder: Secondary | ICD-10-CM

## 2014-06-14 DIAGNOSIS — M25611 Stiffness of right shoulder, not elsewhere classified: Secondary | ICD-10-CM

## 2014-06-14 NOTE — Therapy (Signed)
[place] MAIN [facility] SERVICES [address] [place], [place], [address] Phone: [phone]   Fax:  [phone]  Physical Therapy Treatment  Patient Details  Name: Karen Ramirez MRN: [id number] Date of Birth: September 30, 1950 Referring Provider:  Adin Hector, MD  Encounter Date: 06/14/2014      PT End of Session - 06/14/14 1515    Visit Number 8   Number of Visits 13   Date for PT Re-Evaluation 06/19/14   PT Start Time 5643   PT Stop Time 1450   PT Time Calculation (min) 42 min   Activity Tolerance Patient limited by pain;Patient tolerated treatment well   Behavior During Therapy [facility] for tasks assessed/performed      Past Medical History  Diagnosis Date  . Hypothyroidism   . GERD (gastroesophageal reflux disease)   . Arthritis   . DDD (degenerative disc disease), lumbar   . Wears glasses   . Hypercholesteremia     Past Surgical History  Procedure Laterality Date  . Abdominal hysterectomy  1986  . Esophagogastroduodenoscopy    . Lumbar laminectomy  1989  . Vein ligation      left  . Colonoscopy    . Thumb arthroscopy  3/16    left  . Blepharoplasty      both eyes  . Shoulder arthroscopy with distal clavicle resection Right 05/10/2014    Procedure: SHOULDER ARTHROSCOPY WITH DISTAL CLAVICLE RESECTION DEBRIDEMENT OF ROTATOR CUFF TEAR;  Surgeon: Tania Ade, MD;  Location: [place];  Service: Orthopedics;  Laterality: Right;  . Shoulder arthroscopy with bicepstenotomy Right 05/10/2014    Procedure: SHOULDER ARTHROSCOPY WITH BICEPSTENOTOMY;  Surgeon: Tania Ade, MD;  Location: [place];  Service: Orthopedics;  Laterality: Right;  . Shoulder arthroscopy with subacromial decompression Right 05/10/2014    Procedure: SHOULDER ARTHROSCOPY WITH SUBACROMIAL DECOMPRESSION;  Surgeon: Tania Ade, MD;  Location: [place];  Service: Orthopedics;  Laterality: Right;    There were  no vitals filed for this visit.  Visit Diagnosis:  Pain in joint, shoulder region, right  Shoulder stiffness, right      Subjective Assessment - 06/14/14 1510    Subjective "im still having difficulty lifting my arm. " pt reports she went to the surgeon for follow up and he is pleased with her progress but recognizes she needs more strengthening.    Patient Stated Goals pt would like to be able to do her hair, over head activities   Currently in Pain? Yes   Pain Score 3    Pain Location Shoulder   Pain Orientation Right   Pain Descriptors / Indicators Aching      Treatment: UBE L 1 x 3 min warm up Finger ladder flexion/abd x 10 each Shoulder fleixon isometric 5s x 10 Serratus punch 1lb 2x10 AAROM shoulder flexion x 5, then unassisted 2x5 with elbow bent Prone row/shoulder extension 1lb 2x10 Prone shoulder flexion with manual resist under neutral 2x10 Shoulder ER/IR with manual resist in supine 2x10 sidelying ER x 10 Pt requires min cues for proper performance of exercises  Followed by ice to R shoulder.                                 PT Long Term Goals - 05/24/14 1547    PT LONG TERM GOAL #1   Title pt will demonstrate full PROM of R shoulder  Time 4   Period Weeks   Status New   PT LONG TERM GOAL #2   Title pt will improve active shoulder flexion to 140 and ER to 60 to be able to do her hair.    Time 6   Period Weeks   Status New   PT LONG TERM GOAL #3   Title pt will demonsrate shoulder flexion strength at least 4-/5 to be able to lift light items into cabinet   Time 6   Period Weeks   Status New   PT LONG TERM GOAL #4   Title pt will be able to reach behind back with sufficent IR to undo her bra strap   Time 6   Period Weeks   Status New   PT LONG TERM GOAL #5   Title pt will improve quick dash score by 15 pts indicating signficant functional improvement.    Time 6   Period Weeks   Status New               Plan -  06/14/14 1516    Clinical Impression Statement modified HEP to focus on initial 30 deg flexion wiht anterior delt activation to accomodate for loss of RTC strength. pt is able to flex shoulder to 90 deg in supine after it is put in neutral, with great deal of effort, but was able to do it unassisted x 5 today showing strength improvement.    Pt will benefit from skilled therapeutic intervention in order to improve on the following deficits Pain;Decreased strength;Decreased scar mobility;Impaired UE functional use;Postural dysfunction;Impaired flexibility;Improper body mechanics;Decreased range of motion   Rehab Potential Good   PT Frequency 2x / week   PT Duration 6 weeks   PT Treatment/Interventions Aquatic Therapy;Neuromuscular re-education;Scar mobilization;Ultrasound;Passive range of motion;Patient/family education;Dry needling;Therapeutic activities;Cryotherapy;Electrical Stimulation;Therapeutic exercise;Manual techniques;Moist Heat   PT Next Visit Plan reasses soft tissue, treat as indicated, possibly trigger point dry needling.         Problem List There are no active problems to display for this patient.  Gorden Harms. Jaris Kohles, PT, DPT [phone]  Imogen Maddalena 06/14/2014, 3:18 PM  [place] MAIN [facility] SERVICES [address] [place], [place], [address] Phone: [phone]   Fax:  [phone]

## 2014-06-14 NOTE — Patient Instructions (Signed)
[email or web address] Prone shoulder flexion 2x10 iso shoulder flexion 5s x 10 Serratus punch 1lb 2x10

## 2014-06-17 ENCOUNTER — Ambulatory Visit: Payer: 59

## 2014-06-17 DIAGNOSIS — M25611 Stiffness of right shoulder, not elsewhere classified: Secondary | ICD-10-CM

## 2014-06-17 DIAGNOSIS — M25511 Pain in right shoulder: Secondary | ICD-10-CM

## 2014-06-17 NOTE — Therapy (Signed)
[place] MAIN [facility] SERVICES [address] [place], [place], [address] Phone: [phone]   Fax:  [phone]  Physical Therapy Treatment  Patient Details  Name: Karen Ramirez MRN: [id number] Date of Birth: 12/20/1950 Referring Provider:  Tania Ade, MD  Encounter Date: 06/17/2014      PT End of Session - 06/17/14 1522    Visit Number 9   Number of Visits 13   Date for PT Re-Evaluation 06/19/14   PT Start Time 1400   PT Stop Time 1435   PT Time Calculation (min) 35 min   Activity Tolerance Patient tolerated treatment well   Behavior During Therapy [facility] for tasks assessed/performed      Past Medical History  Diagnosis Date  . Hypothyroidism   . GERD (gastroesophageal reflux disease)   . Arthritis   . DDD (degenerative disc disease), lumbar   . Wears glasses   . Hypercholesteremia     Past Surgical History  Procedure Laterality Date  . Abdominal hysterectomy  1986  . Esophagogastroduodenoscopy    . Lumbar laminectomy  1989  . Vein ligation      left  . Colonoscopy    . Thumb arthroscopy  3/16    left  . Blepharoplasty      both eyes  . Shoulder arthroscopy with distal clavicle resection Right 05/10/2014    Procedure: SHOULDER ARTHROSCOPY WITH DISTAL CLAVICLE RESECTION DEBRIDEMENT OF ROTATOR CUFF TEAR;  Surgeon: Tania Ade, MD;  Location: [place];  Service: Orthopedics;  Laterality: Right;  . Shoulder arthroscopy with bicepstenotomy Right 05/10/2014    Procedure: SHOULDER ARTHROSCOPY WITH BICEPSTENOTOMY;  Surgeon: Tania Ade, MD;  Location: [place];  Service: Orthopedics;  Laterality: Right;  . Shoulder arthroscopy with subacromial decompression Right 05/10/2014    Procedure: SHOULDER ARTHROSCOPY WITH SUBACROMIAL DECOMPRESSION;  Surgeon: Tania Ade, MD;  Location: [place];  Service: Orthopedics;  Laterality: Right;    There were no vitals filed for this  visit.  Visit Diagnosis:  Pain in joint, shoulder region, right  Shoulder stiffness, right      Subjective Assessment - 06/17/14 1520    Subjective pt reports she did a lot of ironing yesterday and now her arm hurts pretty bad.    Patient Stated Goals pt would like to be able to do her hair, over head activities   Currently in Pain? Yes   Pain Score 4    Pain Location Shoulder  R upper trap   Pain Descriptors / Indicators Aching      Manual therapy:  Trigger point release to L upper trap Myofascial release to the L upper trap and SCM Sternoclavicular joint mobs grade 2-3 30 s bouts of 3 AC joint mobs grade 1-2 for pain relief 20s bouts of 5  therex Upper trap stretch 30s x 2 Wall slide with liftoff cues for scap depression                            PT Education - 06/17/14 1521    Education provided Yes   Education Details AAROM shoulder exercises in the water at the pool   Person(s) Educated Patient   Methods Explanation   Comprehension Verbalized understanding             PT Long Term Goals - 05/24/14 1547    PT LONG TERM GOAL #1   Title pt will demonstrate full PROM of  R shoulder    Time 4   Period Weeks   Status New   PT LONG TERM GOAL #2   Title pt will improve active shoulder flexion to 140 and ER to 60 to be able to do her hair.    Time 6   Period Weeks   Status New   PT LONG TERM GOAL #3   Title pt will demonsrate shoulder flexion strength at least 4-/5 to be able to lift light items into cabinet   Time 6   Period Weeks   Status New   PT LONG TERM GOAL #4   Title pt will be able to reach behind back with sufficent IR to undo her bra strap   Time 6   Period Weeks   Status New   PT LONG TERM GOAL #5   Title pt will improve quick dash score by 15 pts indicating signficant functional improvement.    Time 6   Period Weeks   Status New               Plan - 06/17/14 1522    Clinical Impression Statement PT focused  on manual therapy today help with pain relief. pt had multiple trigger points in the upper trap and a great deal of pain in the [facility] joint. pt responded well to manual therapy with reduced pain. pt also able to raise arm through full ROM of flexion although painful.    Pt will benefit from skilled therapeutic intervention in order to improve on the following deficits Pain;Decreased strength;Decreased scar mobility;Impaired UE functional use;Postural dysfunction;Impaired flexibility;Improper body mechanics;Decreased range of motion   Rehab Potential Good   PT Frequency 2x / week   PT Duration 6 weeks   PT Treatment/Interventions Aquatic Therapy;Neuromuscular re-education;Scar mobilization;Ultrasound;Passive range of motion;Patient/family education;Dry needling;Therapeutic activities;Cryotherapy;Electrical Stimulation;Therapeutic exercise;Manual techniques;Moist Heat   PT Next Visit Plan reasses soft tissue, treat as indicated, possibly trigger point dry needling.         Problem List There are no active problems to display for this patient. Gorden Harms. Oddie Bottger, PT, DPT [phone]   Eeshan Verbrugge 06/17/2014, 3:27 PM  [place] MAIN [facility] SERVICES [address] [place], [place], [address] Phone: [phone]   Fax:  [phone]

## 2014-06-28 ENCOUNTER — Ambulatory Visit: Payer: 59

## 2014-06-28 DIAGNOSIS — M25511 Pain in right shoulder: Secondary | ICD-10-CM

## 2014-06-28 DIAGNOSIS — M25611 Stiffness of right shoulder, not elsewhere classified: Secondary | ICD-10-CM

## 2014-06-28 NOTE — Therapy (Signed)
[place] MAIN [facility] SERVICES [address] [place], [place], [address] Phone: [phone]   Fax:  [phone]  Physical Therapy Treatment  Patient Details  Name: Karen Ramirez MRN: [id number] Date of Birth: 1950/11/20 Referring Provider:  Adin Hector, MD  Encounter Date: 06/28/2014      PT End of Session - 06/28/14 1723    Visit Number 10   Number of Visits 22   Date for PT Re-Evaluation 07/26/14   PT Start Time 6073   PT Stop Time 1730   PT Time Calculation (min) 45 min   Activity Tolerance Patient tolerated treatment well   Behavior During Therapy [facility] for tasks assessed/performed      Past Medical History  Diagnosis Date  . Hypothyroidism   . GERD (gastroesophageal reflux disease)   . Arthritis   . DDD (degenerative disc disease), lumbar   . Wears glasses   . Hypercholesteremia     Past Surgical History  Procedure Laterality Date  . Abdominal hysterectomy  1986  . Esophagogastroduodenoscopy    . Lumbar laminectomy  1989  . Vein ligation      left  . Colonoscopy    . Thumb arthroscopy  3/16    left  . Blepharoplasty      both eyes  . Shoulder arthroscopy with distal clavicle resection Right 05/10/2014    Procedure: SHOULDER ARTHROSCOPY WITH DISTAL CLAVICLE RESECTION DEBRIDEMENT OF ROTATOR CUFF TEAR;  Surgeon: Tania Ade, MD;  Location: [place];  Service: Orthopedics;  Laterality: Right;  . Shoulder arthroscopy with bicepstenotomy Right 05/10/2014    Procedure: SHOULDER ARTHROSCOPY WITH BICEPSTENOTOMY;  Surgeon: Tania Ade, MD;  Location: [place];  Service: Orthopedics;  Laterality: Right;  . Shoulder arthroscopy with subacromial decompression Right 05/10/2014    Procedure: SHOULDER ARTHROSCOPY WITH SUBACROMIAL DECOMPRESSION;  Surgeon: Tania Ade, MD;  Location: [place];  Service: Orthopedics;  Laterality: Right;    There were no vitals filed for  this visit.  Visit Diagnosis:  Pain in joint, shoulder region, right - Plan: PT plan of care cert/re-cert  Shoulder stiffness, right - Plan: PT plan of care cert/re-cert      Subjective Assessment - 06/28/14 1723    Subjective pt reports she is feeling her shoulder gradually get stronger and is having less pain. she reports however, it doesnt take much to make it start hurting more.    Patient Stated Goals pt would like to be able to do her hair, over head activities   Currently in Pain? Yes   Pain Score 1    Pain Location --  R shoulder       Therex: UBE no charge warm up x 2.39min Low row: red band 3x10 Bicep curl: yellow band 2x10 tricep extension yellow band 2x10 D1 shoulder extension: yellow band 2x10 D2 shoulder flexion in supine 2x5 wall slide with lift 2x5 Pt requires min-mod verbal/tactile cues for proper exercise performance.   Manual therapy: joint mobilization grade 2-3 AP and inferior GH mobs 3 bouts of 20-30s oscillations Mobilization with movement ER/IR with posterior glide 2x10 Joint mobilizations at range flexion/abduction/ER same as above   Treatment followed ice to R shoulder x 9 min                              PT Long Term Goals - 06/28/14 1726    PT LONG TERM GOAL #  1   Title pt will demonstrate full PROM of R shoulder    Time 4   Period Weeks   Status Partially Met   PT LONG TERM GOAL #2   Title pt will improve active shoulder flexion to 140 and ER to 60 to be able to do her hair.    Time 6   Period Weeks   Status Achieved   PT LONG TERM GOAL #3   Title pt will demonsrate shoulder flexion strength at least 4-/5 to be able to lift light items into cabinet   Time 6   Period Weeks   Status Partially Met   PT LONG TERM GOAL #4   Title pt will be able to reach behind back with sufficent IR to undo her bra strap   Time 6   Period Weeks   Status Partially Met   PT LONG TERM GOAL #5   Title pt will improve quick dash  score by 15 pts indicating signficant functional improvement.    Time 6   Period Weeks   Status On-going               Plan - 06/28/14 1724    Clinical Impression Statement pt progressing gradually towards goals. pt is partially compliant with HEP, but urged to be more compliant. pt demonstrates improved strength, ROM, begain resistance exercises today.    Pt will benefit from skilled therapeutic intervention in order to improve on the following deficits Pain;Decreased strength;Decreased scar mobility;Impaired UE functional use;Postural dysfunction;Impaired flexibility;Improper body mechanics;Decreased range of motion   Rehab Potential Good   PT Frequency 2x / week   PT Duration 6 weeks   PT Treatment/Interventions Aquatic Therapy;Neuromuscular re-education;Scar mobilization;Ultrasound;Passive range of motion;Patient/family education;Dry needling;Therapeutic activities;Cryotherapy;Electrical Stimulation;Therapeutic exercise;Manual techniques;Moist Heat   PT Next Visit Plan reasses soft tissue, treat as indicated, possibly trigger point dry needling.         Problem List There are no active problems to display for this patient.  Gorden Harms. Andreka Stucki, PT, DPT [phone]  Jamella Grayer 06/28/2014, 5:37 PM  [place] MAIN [facility] SERVICES [address] [place], [place], [address] Phone: [phone]   Fax:  [phone]

## 2014-06-30 ENCOUNTER — Ambulatory Visit: Payer: 59

## 2014-06-30 DIAGNOSIS — M25511 Pain in right shoulder: Secondary | ICD-10-CM

## 2014-06-30 DIAGNOSIS — M25612 Stiffness of left shoulder, not elsewhere classified: Secondary | ICD-10-CM

## 2014-06-30 NOTE — Therapy (Signed)
[place] MAIN [facility] SERVICES [address] [place], [place], [address] Phone: [phone]   Fax:  [phone]  Physical Therapy Treatment  Patient Details  Name: Karen Ramirez MRN: [id number] Date of Birth: 1950-01-13 Referring Provider:  Adin Hector, MD  Encounter Date: 06/30/2014      PT End of Session - 06/30/14 1543    Visit Number 11   Number of Visits 22   Date for PT Re-Evaluation 07/26/14   PT Start Time 8889   PT Stop Time 1430   PT Time Calculation (min) 45 min   Activity Tolerance Patient tolerated treatment well   Behavior During Therapy [facility] for tasks assessed/performed      Past Medical History  Diagnosis Date  . Hypothyroidism   . GERD (gastroesophageal reflux disease)   . Arthritis   . DDD (degenerative disc disease), lumbar   . Wears glasses   . Hypercholesteremia     Past Surgical History  Procedure Laterality Date  . Abdominal hysterectomy  1986  . Esophagogastroduodenoscopy    . Lumbar laminectomy  1989  . Vein ligation      left  . Colonoscopy    . Thumb arthroscopy  3/16    left  . Blepharoplasty      both eyes  . Shoulder arthroscopy with distal clavicle resection Right 05/10/2014    Procedure: SHOULDER ARTHROSCOPY WITH DISTAL CLAVICLE RESECTION DEBRIDEMENT OF ROTATOR CUFF TEAR;  Surgeon: Tania Ade, MD;  Location: [place];  Service: Orthopedics;  Laterality: Right;  . Shoulder arthroscopy with bicepstenotomy Right 05/10/2014    Procedure: SHOULDER ARTHROSCOPY WITH BICEPSTENOTOMY;  Surgeon: Tania Ade, MD;  Location: [place];  Service: Orthopedics;  Laterality: Right;  . Shoulder arthroscopy with subacromial decompression Right 05/10/2014    Procedure: SHOULDER ARTHROSCOPY WITH SUBACROMIAL DECOMPRESSION;  Surgeon: Tania Ade, MD;  Location: [place];  Service: Orthopedics;  Laterality: Right;    There were no vitals filed for  this visit.  Visit Diagnosis:  Pain in joint, shoulder region, right  Shoulder stiffness, left      Subjective Assessment - 06/30/14 1540    Subjective pt reports she was sore after her last session and had to use ice to help decresae the pain.  pt relates her soreness/pain has decreased but still hurts currently.  pt relates the tricep extension exercises is probably what cuased the soreness.     Patient Stated Goals pt would like to be able to do her hair, over head activities   Pain Score 3    Pain Location Shoulder   Pain Orientation Right       therex: UBE no charge warm up x 2.56min Low row with yellow theraband 2x10 Bicep curl: yellow band 2x10 D1 right shoulder extension: yellow band 2x10 D1 right shoulder extension in supine x10 D2 shoulder flexion in supine 2x3 wall slide with lift x8 Supine serratus punch x10 Supine shoulder perturbations 2x10 sec  Pt requires min-mod verbal/tactile cues for proper exercise technique   Manual therapy: joint mobilization grade 2-3 AP and inferior GH mobs 3 bouts of 20-30s  Shoulder flexion and external rotation with over pressure in supine 2x15 secs   Treatment followed ice to R shoulder x 8 min                                 PT Education -  06/30/14 1542    Education provided Yes   Education Details sorness can last up to 48 hours and exercises should become easier with time.    Person(s) Educated Patient   Methods Explanation   Comprehension Verbalized understanding             PT Long Term Goals - 06/28/14 1726    PT LONG TERM GOAL #1   Title pt will demonstrate full PROM of R shoulder    Time 4   Period Weeks   Status Partially Met   PT LONG TERM GOAL #2   Title pt will improve active shoulder flexion to 140 and ER to 60 to be able to do her hair.    Time 6   Period Weeks   Status Achieved   PT LONG TERM GOAL #3   Title pt will demonsrate shoulder flexion strength at least 4-/5 to  be able to lift light items into cabinet   Time 6   Period Weeks   Status Partially Met   PT LONG TERM GOAL #4   Title pt will be able to reach behind back with sufficent IR to undo her bra strap   Time 6   Period Weeks   Status Partially Met   PT LONG TERM GOAL #5   Title pt will improve quick dash score by 15 pts indicating signficant functional improvement.    Time 6   Period Weeks   Status On-going               Plan - 06/30/14 1546    Clinical Impression Statement PT modified session by decreasing intensity of exercises due to residual soreness from last session.  pt was able to complete exercises wth minimal-moderate increase in pain.  pt is would benefit from continued skilled PT services to improve ROM and strength.     Pt will benefit from skilled therapeutic intervention in order to improve on the following deficits Pain;Decreased strength;Decreased scar mobility;Impaired UE functional use;Postural dysfunction;Impaired flexibility;Improper body mechanics;Decreased range of motion   Rehab Potential Good   PT Frequency 2x / week   PT Duration 6 weeks   PT Treatment/Interventions Aquatic Therapy;Neuromuscular re-education;Scar mobilization;Ultrasound;Passive range of motion;Patient/family education;Dry needling;Therapeutic activities;Cryotherapy;Electrical Stimulation;Therapeutic exercise;Manual techniques;Moist Heat   PT Next Visit Plan reasses soft tissue, treat as indicated, possibly trigger point dry needling.         Problem List There are no active problems to display for this patient.   Renford Dills, SPT 07/01/2014, 9:48 AM This entire session was performed under direct supervision and direction of a licensed therapist/therapist assistant . I have personally read, edited and approve of the note as written.  Gorden Harms. Tortorici, PT, DPT [phone]  [place] MAIN [facility] SERVICES [address] [place], [place],  [address] Phone: [phone]   Fax:  [phone]

## 2014-07-06 ENCOUNTER — Ambulatory Visit: Payer: 59 | Attending: Orthopedic Surgery

## 2014-07-06 DIAGNOSIS — M25511 Pain in right shoulder: Secondary | ICD-10-CM | POA: Diagnosis present

## 2014-07-06 DIAGNOSIS — M25611 Stiffness of right shoulder, not elsewhere classified: Secondary | ICD-10-CM | POA: Diagnosis not present

## 2014-07-06 NOTE — Therapy (Signed)
[place] MAIN [facility] SERVICES [address] [place], [place], [address] Phone: [phone]   Fax:  [phone]  Physical Therapy Treatment  Patient Details  Name: Karen Ramirez MRN: [id number] Date of Birth: Jul 26, 1950 Referring Provider:  Tania Ade, MD  Encounter Date: 07/06/2014      PT End of Session - 07/06/14 1817    Visit Number 11   Number of Visits 22   Date for PT Re-Evaluation 07/26/14   PT Start Time 1450   PT Stop Time 1540   PT Time Calculation (min) 50 min   Activity Tolerance Patient tolerated treatment well   Behavior During Therapy [facility] for tasks assessed/performed      Past Medical History  Diagnosis Date  . Hypothyroidism   . GERD (gastroesophageal reflux disease)   . Arthritis   . DDD (degenerative disc disease), lumbar   . Wears glasses   . Hypercholesteremia     Past Surgical History  Procedure Laterality Date  . Abdominal hysterectomy  1986  . Esophagogastroduodenoscopy    . Lumbar laminectomy  1989  . Vein ligation      left  . Colonoscopy    . Thumb arthroscopy  3/16    left  . Blepharoplasty      both eyes  . Shoulder arthroscopy with distal clavicle resection Right 05/10/2014    Procedure: SHOULDER ARTHROSCOPY WITH DISTAL CLAVICLE RESECTION DEBRIDEMENT OF ROTATOR CUFF TEAR;  Surgeon: Tania Ade, MD;  Location: [place];  Service: Orthopedics;  Laterality: Right;  . Shoulder arthroscopy with bicepstenotomy Right 05/10/2014    Procedure: SHOULDER ARTHROSCOPY WITH BICEPSTENOTOMY;  Surgeon: Tania Ade, MD;  Location: [place];  Service: Orthopedics;  Laterality: Right;  . Shoulder arthroscopy with subacromial decompression Right 05/10/2014    Procedure: SHOULDER ARTHROSCOPY WITH SUBACROMIAL DECOMPRESSION;  Surgeon: Tania Ade, MD;  Location: [place];  Service: Orthopedics;  Laterality: Right;    There were no vitals filed for this  visit.  Visit Diagnosis:  Pain in joint, shoulder region, right  Shoulder stiffness, right      Subjective Assessment - 07/06/14 1723    Subjective pt reports she has been sore all weekend until last night were pain returned to baseline of 2/10.  pt relates pain started Saturday 7/2 evening. pt does not recall any event that may have increased her pain.     Patient Stated Goals pt would like to be able to do her hair, over head activities   Pain Score 2    Pain Location Shoulder   Pain Orientation Right   Pain Descriptors / Indicators Aching         therex: UBE no charge warm up x 93mn Low row with yellow theraband 2x10 Bicep curl: yellow band 2x10 D1 right shoulder extension: yellow band 1x10 D1 right shoulder extension in supine x10 D2 shoulder flexion in supine x10 pt required verbal and tactile cues for correct technique wall slide with lift x8 sidelying shoulder external rotation 2x10 sideyling shoulder flexion 2x10 Cervical retractions to help improve posture x10 Pt requires min-mod verbal/tactile cues for proper exercise technique   Manual therapy: joint mobilization grade 2-3 AP and inferior GH mobs 3 bouts of 20-30s  Shoulder flexion and external rotation with over pressure in supine 2x15 secs  Soft tissue mobilization(muscle stripping, effleurage) to upper trap to decrease tenderness/tightness x4 min  PT Education - 07/06/14 1728    Education provided Yes   Education Details continue massaging tender areas and to start sidelying shoulder external rotation    Person(s) Educated Patient   Methods Explanation   Comprehension Verbalized understanding             PT Long Term Goals - 06/28/14 1726    PT LONG TERM GOAL #1   Title pt will demonstrate full PROM of R shoulder    Time 4   Period Weeks   Status Partially Met   PT LONG TERM GOAL #2   Title pt will improve active shoulder flexion to 140 and ER  to 60 to be able to do her hair.    Time 6   Period Weeks   Status Achieved   PT LONG TERM GOAL #3   Title pt will demonsrate shoulder flexion strength at least 4-/5 to be able to lift light items into cabinet   Time 6   Period Weeks   Status Partially Met   PT LONG TERM GOAL #4   Title pt will be able to reach behind back with sufficent IR to undo her bra strap   Time 6   Period Weeks   Status Partially Met   PT LONG TERM GOAL #5   Title pt will improve quick dash score by 15 pts indicating signficant functional improvement.    Time 6   Period Weeks   Status On-going               Plan - 07/06/14 1824    Clinical Impression Statement pt is gradually progressing with strength exercises.  pt is continues to experience min-moderate pain with exercises but motion is more fluid and less dyfunctional.  pt requires min verbal and tactile from hiking/shruging right shoulder.  pt would benefit from continued skilled PT services to improve strength and ROM.    Pt will benefit from skilled therapeutic intervention in order to improve on the following deficits Pain;Decreased strength;Decreased scar mobility;Impaired UE functional use;Postural dysfunction;Impaired flexibility;Improper body mechanics;Decreased range of motion   Rehab Potential Good   PT Frequency 2x / week   PT Duration 6 weeks   PT Treatment/Interventions Aquatic Therapy;Neuromuscular re-education;Scar mobilization;Ultrasound;Passive range of motion;Patient/family education;Dry needling;Therapeutic activities;Cryotherapy;Electrical Stimulation;Therapeutic exercise;Manual techniques;Moist Heat   PT Next Visit Plan reasses soft tissue, treat as indicated, possibly trigger point dry needling.         Problem List There are no active problems to display for this patient.  Renford Dills, SPT Renford Dills 07/06/2014, 6:46 PM This entire session was performed under direct supervision and direction of a licensed  therapist/therapist assistant . I have personally read, edited and approve of the note as written. Gorden Harms. Tortorici, PT, DPT [phone]  [place] MAIN [facility] SERVICES [address] [place], [place], [address] Phone: [phone]   Fax:  [phone]

## 2014-07-06 NOTE — Patient Instructions (Signed)
[email or web address] sidelying shoulder external rotation 2x10

## 2014-07-08 ENCOUNTER — Ambulatory Visit: Payer: 59

## 2014-07-08 DIAGNOSIS — M25511 Pain in right shoulder: Secondary | ICD-10-CM

## 2014-07-08 DIAGNOSIS — M25611 Stiffness of right shoulder, not elsewhere classified: Secondary | ICD-10-CM

## 2014-07-08 NOTE — Therapy (Signed)
[place] MAIN [facility] SERVICES [address] [place], [place], [address] Phone: [phone]   Fax:  [phone]  Physical Therapy Treatment  Patient Details  Name: Karen Ramirez MRN: [id number] Date of Birth: 06-19-1950 Referring Provider:  Adin Hector, MD  Encounter Date: 07/08/2014      PT End of Session - 07/08/14 1718    Visit Number 12   Number of Visits 22   Date for PT Re-Evaluation 07/26/14   PT Start Time 0737   PT Stop Time 1530   PT Time Calculation (min) 43 min   Activity Tolerance Patient tolerated treatment well   Behavior During Therapy [facility] for tasks assessed/performed      Past Medical History  Diagnosis Date  . Hypothyroidism   . GERD (gastroesophageal reflux disease)   . Arthritis   . DDD (degenerative disc disease), lumbar   . Wears glasses   . Hypercholesteremia     Past Surgical History  Procedure Laterality Date  . Abdominal hysterectomy  1986  . Esophagogastroduodenoscopy    . Lumbar laminectomy  1989  . Vein ligation      left  . Colonoscopy    . Thumb arthroscopy  3/16    left  . Blepharoplasty      both eyes  . Shoulder arthroscopy with distal clavicle resection Right 05/10/2014    Procedure: SHOULDER ARTHROSCOPY WITH DISTAL CLAVICLE RESECTION DEBRIDEMENT OF ROTATOR CUFF TEAR;  Surgeon: Tania Ade, MD;  Location: [place];  Service: Orthopedics;  Laterality: Right;  . Shoulder arthroscopy with bicepstenotomy Right 05/10/2014    Procedure: SHOULDER ARTHROSCOPY WITH BICEPSTENOTOMY;  Surgeon: Tania Ade, MD;  Location: [place];  Service: Orthopedics;  Laterality: Right;  . Shoulder arthroscopy with subacromial decompression Right 05/10/2014    Procedure: SHOULDER ARTHROSCOPY WITH SUBACROMIAL DECOMPRESSION;  Surgeon: Tania Ade, MD;  Location: [place];  Service: Orthopedics;  Laterality: Right;    There were no vitals filed for  this visit.  Visit Diagnosis:  Pain in joint, shoulder region, right  Shoulder stiffness, right      Subjective Assessment - 07/08/14 1715    Subjective pt relates she is a bit sore from ironing this morning with 1-2/10 pain.  pt reports she experienced minor sorness/pain after last session.   Patient Stated Goals pt would like to be able to do her hair, over head activities   Currently in Pain? Yes   Pain Score 2    Pain Location Shoulder   Pain Orientation Right         therex: UBE no charge warm up x 59min In supine, shoulder flexion/elbow extension with serratus punch at the end with 1# weight 2x10, In supine, shoulder flexion/elbow extension with serratus punch at the end with no weight x10 Low row with yellow theraband 2x10 Shoulder extension with yellow band x10 Bicep curl with shoulder flexion with yellow band x10 Shoulder flexion with yellow band x10, is able to flex shoulder ~ 10 degrees Bicep curl: yellow band 2x10 D1 right shoulder extension in supine x10 with min assist for control shoulder external rotation 2x10 with yellow band, min tension  Shoulder internal rotation 2x10 with yellow band Pt requires min-mod verbal/tactile cues for proper exercise technique    Ice x8 min to right shoulder                         PT Education -  07/08/14 1717    Education provided Yes   Education Details focus on strengthening shoulder musculature versus improving ROM   Person(s) Educated Patient   Methods Explanation   Comprehension Verbalized understanding             PT Long Term Goals - 06/28/14 1726    PT LONG TERM GOAL #1   Title pt will demonstrate full PROM of R shoulder    Time 4   Period Weeks   Status Partially Met   PT LONG TERM GOAL #2   Title pt will improve active shoulder flexion to 140 and ER to 60 to be able to do her hair.    Time 6   Period Weeks   Status Achieved   PT LONG TERM GOAL #3   Title pt will demonsrate  shoulder flexion strength at least 4-/5 to be able to lift light items into cabinet   Time 6   Period Weeks   Status Partially Met   PT LONG TERM GOAL #4   Title pt will be able to reach behind back with sufficent IR to undo her bra strap   Time 6   Period Weeks   Status Partially Met   PT LONG TERM GOAL #5   Title pt will improve quick dash score by 15 pts indicating signficant functional improvement.    Time 6   Period Weeks   Status On-going               Plan - 07/08/14 1719    Clinical Impression Statement pt experienced minor increase of pain (2/10) with progression of strength exercises.  pt deomonstrates improved strength/functional activity tolerance by performing more therapuetic exercises with increased weight.  pt would benefit from continued skilled PT services to improve strength and remaining deficits.   Pt will benefit from skilled therapeutic intervention in order to improve on the following deficits Pain;Decreased strength;Decreased scar mobility;Impaired UE functional use;Postural dysfunction;Impaired flexibility;Improper body mechanics;Decreased range of motion   Rehab Potential Good   PT Frequency 2x / week   PT Duration 6 weeks   PT Treatment/Interventions Aquatic Therapy;Neuromuscular re-education;Scar mobilization;Ultrasound;Passive range of motion;Patient/family education;Dry needling;Therapeutic activities;Cryotherapy;Electrical Stimulation;Therapeutic exercise;Manual techniques;Moist Heat   PT Next Visit Plan progress strength HEP        Problem List There are no active problems to display for this patient.   Renford Dills 07/08/2014, 5:24 PM Karen Ramirez, PT, DPT [phone]  [place] MAIN [facility] SERVICES [address] [place], [place], [address] Phone: [phone]   Fax:  [phone]

## 2014-07-12 ENCOUNTER — Ambulatory Visit: Payer: 59

## 2014-07-12 DIAGNOSIS — M25511 Pain in right shoulder: Secondary | ICD-10-CM | POA: Diagnosis not present

## 2014-07-12 DIAGNOSIS — M25611 Stiffness of right shoulder, not elsewhere classified: Secondary | ICD-10-CM

## 2014-07-12 NOTE — Therapy (Signed)
[place] MAIN [facility] SERVICES [address] [place], [place], [address] Phone: [phone]   Fax:  [phone]  Physical Therapy Treatment  Patient Details  Name: Karen Ramirez MRN: [id number] Date of Birth: 12/09/1950 Referring Provider:  Adin Hector, MD  Encounter Date: 07/12/2014      PT End of Session - 07/12/14 1647    Visit Number 13   Number of Visits 22   Date for PT Re-Evaluation 07/26/14   PT Start Time 2641   PT Stop Time 1530   PT Time Calculation (min) 45 min   Activity Tolerance Patient tolerated treatment well   Behavior During Therapy [facility] for tasks assessed/performed      Past Medical History  Diagnosis Date  . Hypothyroidism   . GERD (gastroesophageal reflux disease)   . Arthritis   . DDD (degenerative disc disease), lumbar   . Wears glasses   . Hypercholesteremia     Past Surgical History  Procedure Laterality Date  . Abdominal hysterectomy  1986  . Esophagogastroduodenoscopy    . Lumbar laminectomy  1989  . Vein ligation      left  . Colonoscopy    . Thumb arthroscopy  3/16    left  . Blepharoplasty      both eyes  . Shoulder arthroscopy with distal clavicle resection Right 05/10/2014    Procedure: SHOULDER ARTHROSCOPY WITH DISTAL CLAVICLE RESECTION DEBRIDEMENT OF ROTATOR CUFF TEAR;  Surgeon: Tania Ade, MD;  Location: [place];  Service: Orthopedics;  Laterality: Right;  . Shoulder arthroscopy with bicepstenotomy Right 05/10/2014    Procedure: SHOULDER ARTHROSCOPY WITH BICEPSTENOTOMY;  Surgeon: Tania Ade, MD;  Location: [place];  Service: Orthopedics;  Laterality: Right;  . Shoulder arthroscopy with subacromial decompression Right 05/10/2014    Procedure: SHOULDER ARTHROSCOPY WITH SUBACROMIAL DECOMPRESSION;  Surgeon: Tania Ade, MD;  Location: [place];  Service: Orthopedics;  Laterality: Right;    There were no vitals filed for  this visit.  Visit Diagnosis:  Pain in joint, shoulder region, right  Shoulder stiffness, right      Subjective Assessment - 07/12/14 1643    Subjective pt relates she is doing well and that some of her exercises at home are getting easier.  pt reports she has one more session after today.    Patient Stated Goals pt would like to be able to do her hair, over head activities   Currently in Pain? Yes   Pain Score 3    Pain Location Shoulder   Pain Orientation Right          therex: UBE no charge warm up x 4min In supine, shoulder flexion/elbow extension with serratus punch at the end with 1 # x10 Low row with red theraband 2x10 Shoulder extension with red band x10 Bicep curl with shoulder flexion with yellow band x10 Shoulder flexion with yellow band x10, is able to flex shoulder ~ 10 degrees Bicep curl: red band 2x10 D2 right shoulder extension with red band x10 D1 right shoulder extension in supine x10 with min assist for control shoulder external rotation 2x10 with yellow band, min tension in standing sidelying external rotation with x 10 with assist from opposite UE Pt requires min verbal/tactile le cues for proper exercise technique, pt is able to perform most exercises with less shrugging   Ice x10 min to right shoulder  PT Education - 07/12/14 1646    Education provided Yes   Education Details on progressing exercises with red band to improve strength and function gains   Person(s) Educated Patient   Methods Explanation   Comprehension Verbalized understanding             PT Long Term Goals - 06/28/14 1726    PT LONG TERM GOAL #1   Title pt will demonstrate full PROM of R shoulder    Time 4   Period Weeks   Status Partially Met   PT LONG TERM GOAL #2   Title pt will improve active shoulder flexion to 140 and ER to 60 to be able to do her hair.    Time 6   Period Weeks   Status Achieved   PT  LONG TERM GOAL #3   Title pt will demonsrate shoulder flexion strength at least 4-/5 to be able to lift light items into cabinet   Time 6   Period Weeks   Status Partially Met   PT LONG TERM GOAL #4   Title pt will be able to reach behind back with sufficent IR to undo her bra strap   Time 6   Period Weeks   Status Partially Met   PT LONG TERM GOAL #5   Title pt will improve quick dash score by 15 pts indicating signficant functional improvement.    Time 6   Period Weeks   Status On-going               Plan - 07/12/14 1649    Clinical Impression Statement pt demonstrates improved strength and control with exercises and requires less verbal and tactile cues to perform exercises correctly.  pt was able to perform some exercises with increaed reistance (red band) with no-minor increase in pain.  pt's main deficits are performing UE's movents that inovle external rotation and or shoulder flexion.  pt would benefit from skilled PT services to improve strength and functional activity tolerance.     Pt will benefit from skilled therapeutic intervention in order to improve on the following deficits Pain;Decreased strength;Decreased scar mobility;Impaired UE functional use;Postural dysfunction;Impaired flexibility;Improper body mechanics;Decreased range of motion   Rehab Potential Good   PT Frequency 2x / week   PT Duration 6 weeks   PT Treatment/Interventions Aquatic Therapy;Neuromuscular re-education;Scar mobilization;Ultrasound;Passive range of motion;Patient/family education;Dry needling;Therapeutic activities;Cryotherapy;Electrical Stimulation;Therapeutic exercise;Manual techniques;Moist Heat   PT Next Visit Plan progress strength HEP        Problem List There are no active problems to display for this patient.  Renford Dills, SPT This entire session was performed under direct supervision and direction of a licensed Chiropractor . I have personally read, edited  and approve of the note as written. Gorden Harms. Tortorici, PT, DPT [phone]  Tortorici,Ashley 07/13/2014, 1:57 PM  [place] [facility] MAIN [facility] SERVICES [address] [place], [place], [address] Phone: [phone]   Fax:  [phone]

## 2014-07-12 NOTE — Patient Instructions (Signed)
[email or web address] Serratus punches 2x10 sidelying shoulder flexion 2x10 sidelying shoulder external rotation Bicep curl with red band 2x10 Rows with red band 2x10 Shoulder flexion with yellow band 2x10 Bilateral shoulder extension with red band 2x10

## 2014-07-19 ENCOUNTER — Ambulatory Visit: Payer: 59

## 2014-07-19 DIAGNOSIS — M25611 Stiffness of right shoulder, not elsewhere classified: Secondary | ICD-10-CM

## 2014-07-19 DIAGNOSIS — M25511 Pain in right shoulder: Secondary | ICD-10-CM | POA: Diagnosis not present

## 2014-07-19 NOTE — Therapy (Signed)
[place] MAIN [facility] SERVICES [address] [place], [place], [address] Phone: [phone]   Fax:  [phone]  Physical Therapy Treatment/Discharge Summary    Patient Details  Name: Karen Ramirez MRN: [id number] Date of Birth: 10/31/1950 Referring Provider:  Adin Hector, MD  Encounter Date: 07/19/2014      PT End of Session - 07/19/14 1646    Visit Number 14   Number of Visits 22   Date for PT Re-Evaluation 07/26/14   PT Start Time 6294   PT Stop Time 1530   PT Time Calculation (min) 45 min   Activity Tolerance Patient tolerated treatment well   Behavior During Therapy [facility] for tasks assessed/performed      Past Medical History  Diagnosis Date  . Hypothyroidism   . GERD (gastroesophageal reflux disease)   . Arthritis   . DDD (degenerative disc disease), lumbar   . Wears glasses   . Hypercholesteremia     Past Surgical History  Procedure Laterality Date  . Abdominal hysterectomy  1986  . Esophagogastroduodenoscopy    . Lumbar laminectomy  1989  . Vein ligation      left  . Colonoscopy    . Thumb arthroscopy  3/16    left  . Blepharoplasty      both eyes  . Shoulder arthroscopy with distal clavicle resection Right 05/10/2014    Procedure: SHOULDER ARTHROSCOPY WITH DISTAL CLAVICLE RESECTION DEBRIDEMENT OF ROTATOR CUFF TEAR;  Surgeon: Tania Ade, MD;  Location: [place];  Service: Orthopedics;  Laterality: Right;  . Shoulder arthroscopy with bicepstenotomy Right 05/10/2014    Procedure: SHOULDER ARTHROSCOPY WITH BICEPSTENOTOMY;  Surgeon: Tania Ade, MD;  Location: [place];  Service: Orthopedics;  Laterality: Right;  . Shoulder arthroscopy with subacromial decompression Right 05/10/2014    Procedure: SHOULDER ARTHROSCOPY WITH SUBACROMIAL DECOMPRESSION;  Surgeon: Tania Ade, MD;  Location: [place];  Service: Orthopedics;  Laterality: Right;    There were  no vitals filed for this visit.  Visit Diagnosis:  Pain in joint, shoulder region, right  Shoulder stiffness, right      Subjective Assessment - 07/19/14 1643    Subjective pt relates she doing well and is ready to "graduate" from PT.    Currently in Pain? Yes   Pain Score 1    Pain Location Shoulder   Pain Orientation Right        therex: UBE no charge warm up x 2mn Low row with red theraband 2x10 Shoulder extension with red band x10 Bicep curl with shoulder flexion with yellow band x10 Shoulder flexion with yellow band x10, is able to flex shoulder ~ 10 degrees Bicep curl: red band 2x10 D1 right shoulder extension in supine x10 with no assist. Pt performs exercises with good control  Wall push ups 2x10 Pt requires min verbal/tactile le cues for proper exercise technique, pt is able to perform most exercises with non-minimal shrugging   Ice x5 min to right shoulder no charge SPT assessed ROM and progress towards goals: Shoulder active ROM Flexion: 150 Abduction: 160 ER: 30  Passive shoulder external rotation: 55 degrees  Quick dash: 15.9%                          PT Education - 07/19/14 1645    Education provided Yes   Education Details to continue HEP to maintain/improve gains made in PT.    Person(s)  Educated Patient   Methods Explanation   Comprehension Verbalized understanding             PT Long Term Goals - 07/19/14 1647    PT LONG TERM GOAL #1   Title pt will demonstrate full PROM of R shoulder    Baseline pt demonstrates AROM of right shoulder enough to perform ADL   Time 4   Period Weeks   Status Partially Met   PT LONG TERM GOAL #2   Title pt will improve active shoulder flexion to 140 and ER to 60 to be able to do her hair.    Baseline active shoulder flexion 150 degrees and able to to her hair   Time 6   Period Weeks   Status Achieved   PT LONG TERM GOAL #3   Title pt will demonsrate shoulder flexion strength at  least 4-/5 to be able to lift light items into cabinet   Baseline pt demonstrates 4-/5 flexion strength and reports being able to lift light items into cabinet   Time 6   Period Weeks   Status Achieved   PT LONG TERM GOAL #4   Title pt will be able to reach behind back with sufficent IR to undo her bra strap   Baseline pt relates she is able to undo her bra strap   Time 6   Period Weeks   Status Achieved   PT LONG TERM GOAL #5   Title pt will improve quick dash score by 15 pts indicating signficant functional improvement.    Time 6   Period Weeks   Status On-going               Plan - 07/19/14 1650    Clinical Impression Statement pt has made signficant gains int PT and has met most of her goals and will be discharged today.  pt has gained sufficent active shoulder ROM and strength to perform her ADLs.  pt does not require further PT servies at this time and discharged with HEP.    Pt will benefit from skilled therapeutic intervention in order to improve on the following deficits Pain;Decreased strength;Decreased scar mobility;Impaired UE functional use;Postural dysfunction;Impaired flexibility;Improper body mechanics;Decreased range of motion   Rehab Potential Good   PT Frequency 2x / week   PT Duration 6 weeks   PT Treatment/Interventions Aquatic Therapy;Neuromuscular re-education;Scar mobilization;Ultrasound;Passive range of motion;Patient/family education;Dry needling;Therapeutic activities;Cryotherapy;Electrical Stimulation;Therapeutic exercise;Manual techniques;Moist Heat        Problem List There are no active problems to display for this patient.  Renford Dills, SPT Renford Dills 07/19/2014, 4:54 PM  This entire session was performed under direct supervision and direction of a licensed therapist/therapist assistant . I have personally read, edited and approve of the note as written. Gorden Harms. Tortorici, PT, DPT [phone]   [place] MAIN [facility] SERVICES [address] [place], [place], [address] Phone: [phone]   Fax:  [phone]

## 2014-07-19 NOTE — Patient Instructions (Signed)
[email or web address]  Wall push-ups 2x10 Bicep curls 3way 2x10 with red band

## 2014-11-10 ENCOUNTER — Other Ambulatory Visit: Payer: Self-pay | Admitting: Internal Medicine

## 2014-11-10 DIAGNOSIS — Z1231 Encounter for screening mammogram for malignant neoplasm of breast: Secondary | ICD-10-CM

## 2014-11-29 ENCOUNTER — Other Ambulatory Visit: Payer: Self-pay | Admitting: Internal Medicine

## 2014-11-29 ENCOUNTER — Ambulatory Visit
Admission: RE | Admit: 2014-11-29 | Discharge: 2014-11-29 | Disposition: A | Discharge status: Home / Self Care | Payer: 59 | Source: Ambulatory Visit | Attending: Internal Medicine | Admitting: Internal Medicine

## 2014-11-29 DIAGNOSIS — Z1231 Encounter for screening mammogram for malignant neoplasm of breast: Secondary | ICD-10-CM | POA: Insufficient documentation

## 2014-12-02 ENCOUNTER — Other Ambulatory Visit: Payer: Self-pay | Admitting: Internal Medicine

## 2014-12-02 DIAGNOSIS — M5136 Other intervertebral disc degeneration, lumbar region: Secondary | ICD-10-CM

## 2014-12-23 ENCOUNTER — Ambulatory Visit
Admission: RE | Admit: 2014-12-23 | Discharge: 2014-12-23 | Disposition: A | Discharge status: Home / Self Care | Payer: 59 | Source: Ambulatory Visit | Attending: Internal Medicine | Admitting: Internal Medicine

## 2014-12-23 DIAGNOSIS — M545 Low back pain: Secondary | ICD-10-CM | POA: Insufficient documentation

## 2014-12-23 DIAGNOSIS — M5136 Other intervertebral disc degeneration, lumbar region: Secondary | ICD-10-CM

## 2014-12-23 DIAGNOSIS — M4806 Spinal stenosis, lumbar region: Secondary | ICD-10-CM | POA: Diagnosis not present

## 2015-01-06 ENCOUNTER — Other Ambulatory Visit: Payer: Self-pay | Admitting: Otolaryngology

## 2015-01-06 DIAGNOSIS — H903 Sensorineural hearing loss, bilateral: Secondary | ICD-10-CM | POA: Diagnosis not present

## 2015-01-06 DIAGNOSIS — H9319 Tinnitus, unspecified ear: Secondary | ICD-10-CM | POA: Diagnosis not present

## 2015-01-06 DIAGNOSIS — H9311 Tinnitus, right ear: Secondary | ICD-10-CM

## 2015-01-06 DIAGNOSIS — H9191 Unspecified hearing loss, right ear: Secondary | ICD-10-CM

## 2015-01-07 DIAGNOSIS — H524 Presbyopia: Secondary | ICD-10-CM | POA: Diagnosis not present

## 2015-01-07 DIAGNOSIS — H5202 Hypermetropia, left eye: Secondary | ICD-10-CM | POA: Diagnosis not present

## 2015-01-07 DIAGNOSIS — H52222 Regular astigmatism, left eye: Secondary | ICD-10-CM | POA: Diagnosis not present

## 2015-01-07 DIAGNOSIS — H52221 Regular astigmatism, right eye: Secondary | ICD-10-CM | POA: Diagnosis not present

## 2015-01-19 DIAGNOSIS — R030 Elevated blood-pressure reading, without diagnosis of hypertension: Secondary | ICD-10-CM | POA: Diagnosis not present

## 2015-01-19 DIAGNOSIS — M4806 Spinal stenosis, lumbar region: Secondary | ICD-10-CM | POA: Diagnosis not present

## 2015-01-19 DIAGNOSIS — Z6829 Body mass index (BMI) 29.0-29.9, adult: Secondary | ICD-10-CM | POA: Diagnosis not present

## 2015-01-27 ENCOUNTER — Ambulatory Visit: Payer: 59

## 2015-02-22 ENCOUNTER — Other Ambulatory Visit: Payer: Self-pay | Admitting: Neurological Surgery

## 2015-04-04 ENCOUNTER — Encounter (HOSPITAL_COMMUNITY)
Admission: RE | Admit: 2015-04-04 | Discharge: 2015-04-04 | Disposition: A | Discharge status: Home / Self Care | Payer: Medicare Other | Source: Ambulatory Visit | Attending: Neurological Surgery | Admitting: Neurological Surgery

## 2015-04-04 ENCOUNTER — Encounter (HOSPITAL_COMMUNITY): Payer: Self-pay

## 2015-04-04 DIAGNOSIS — M4806 Spinal stenosis, lumbar region: Secondary | ICD-10-CM | POA: Diagnosis not present

## 2015-04-04 DIAGNOSIS — Z01812 Encounter for preprocedural laboratory examination: Secondary | ICD-10-CM | POA: Diagnosis not present

## 2015-04-04 HISTORY — DX: Other specified postprocedural states: Z98.890

## 2015-04-04 HISTORY — DX: Other complications of anesthesia, initial encounter: T88.59XA

## 2015-04-04 HISTORY — DX: Adverse effect of unspecified anesthetic, initial encounter: T41.45XA

## 2015-04-04 HISTORY — DX: Nausea with vomiting, unspecified: R11.2

## 2015-04-04 LAB — CBC
HCT: 44.9 % (ref 36.0–46.0)
Hemoglobin: 15 g/dL (ref 12.0–15.0)
MCH: 31.1 pg (ref 26.0–34.0)
MCHC: 33.4 g/dL (ref 30.0–36.0)
MCV: 93.2 fL (ref 78.0–100.0)
PLATELETS: 235 10*3/uL (ref 150–400)
RBC: 4.82 MIL/uL (ref 3.87–5.11)
RDW: 13.6 % (ref 11.5–15.5)
WBC: 6.6 10*3/uL (ref 4.0–10.5)

## 2015-04-04 LAB — SURGICAL PCR SCREEN
MRSA, PCR: NEGATIVE
Staphylococcus aureus: POSITIVE — AB

## 2015-04-04 LAB — BASIC METABOLIC PANEL
Anion gap: 10 (ref 5–15)
BUN: 19 mg/dL (ref 6–20)
CHLORIDE: 108 mmol/L (ref 101–111)
CO2: 25 mmol/L (ref 22–32)
CREATININE: 0.9 mg/dL (ref 0.44–1.00)
Calcium: 9.5 mg/dL (ref 8.9–10.3)
GFR calc Af Amer: >60 mL/min (ref >60)
GFR calc non Af Amer: >60 mL/min (ref >60)
Glucose, Bld: 115 mg/dL — ABNORMAL HIGH (ref 65–99)
Potassium: 4.3 mmol/L (ref 3.5–5.1)
SODIUM: 143 mmol/L (ref 135–145)

## 2015-04-04 NOTE — Pre-Procedure Instructions (Signed)
    Karen Ramirez  04/04/2015      [id number] [address], [place] [address] Phone: [phone] Fax: [phone]    Your procedure is scheduled on 04/12/15.  Report to [facility] Admitting at 7 A.M.  Call this number if you have problems the morning of surgery:  [phone]   Remember:  Do not eat food or drink liquids after midnight.  Take these medicines the morning of surgery with A SIP OF WATER --synthroid,zantac   Do not wear jewelry, make-up or nail polish.  Do not wear lotions, powders, or perfumes.  You may wear deodorant.  Do not shave 48 hours prior to surgery.  Men may shave face and neck.  Do not bring valuables to the hospital.  [facility] is not responsible for any belongings or valuables.  Contacts, dentures or bridgework may not be worn into surgery.  Leave your suitcase in the car.  After surgery it may be brought to your room.  For patients admitted to the hospital, discharge time will be determined by your treatment team.  Patients discharged the day of surgery will not be allowed to drive home.   Name and phone number of your driver:    Special instructions:    Please read over the following fact sheets that you were given. Pain Booklet, Coughing and Deep Breathing, MRSA Information and Surgical Site Infection Prevention

## 2015-04-11 MED ORDER — CEFAZOLIN SODIUM-DEXTROSE 2-4 GM/100ML-% IV SOLN
2.0000 g | INTRAVENOUS | Status: AC
  Administered 2015-04-12: 2 g via INTRAVENOUS
  Filled 2015-04-11: qty 100

## 2015-04-12 ENCOUNTER — Ambulatory Visit (HOSPITAL_COMMUNITY): Payer: Medicare Other | Admitting: Critical Care Medicine

## 2015-04-12 ENCOUNTER — Encounter (HOSPITAL_COMMUNITY): Payer: Self-pay | Admitting: General Practice

## 2015-04-12 ENCOUNTER — Encounter (HOSPITAL_COMMUNITY)
Admission: RE | Disposition: A | Discharge status: Home / Self Care | Payer: Self-pay | Source: Ambulatory Visit | Attending: Neurological Surgery

## 2015-04-12 ENCOUNTER — Inpatient Hospital Stay (HOSPITAL_COMMUNITY)
Admission: RE | Admit: 2015-04-12 | Discharge: 2015-04-13 | DRG: 518 | Disposition: A | Discharge status: Home / Self Care | Payer: Medicare Other | Source: Ambulatory Visit | Attending: Neurological Surgery | Admitting: Neurological Surgery

## 2015-04-12 ENCOUNTER — Ambulatory Visit (HOSPITAL_COMMUNITY): Payer: Medicare Other

## 2015-04-12 DIAGNOSIS — Z7982 Long term (current) use of aspirin: Secondary | ICD-10-CM

## 2015-04-12 DIAGNOSIS — M4316 Spondylolisthesis, lumbar region: Secondary | ICD-10-CM | POA: Diagnosis present

## 2015-04-12 DIAGNOSIS — M48062 Spinal stenosis, lumbar region with neurogenic claudication: Secondary | ICD-10-CM | POA: Diagnosis present

## 2015-04-12 DIAGNOSIS — R32 Unspecified urinary incontinence: Secondary | ICD-10-CM | POA: Diagnosis not present

## 2015-04-12 DIAGNOSIS — E7800 Pure hypercholesterolemia, unspecified: Secondary | ICD-10-CM | POA: Diagnosis present

## 2015-04-12 DIAGNOSIS — M1990 Unspecified osteoarthritis, unspecified site: Secondary | ICD-10-CM | POA: Diagnosis present

## 2015-04-12 DIAGNOSIS — R531 Weakness: Secondary | ICD-10-CM | POA: Diagnosis present

## 2015-04-12 DIAGNOSIS — R110 Nausea: Secondary | ICD-10-CM | POA: Diagnosis not present

## 2015-04-12 DIAGNOSIS — K219 Gastro-esophageal reflux disease without esophagitis: Secondary | ICD-10-CM | POA: Diagnosis present

## 2015-04-12 DIAGNOSIS — E039 Hypothyroidism, unspecified: Secondary | ICD-10-CM | POA: Diagnosis present

## 2015-04-12 DIAGNOSIS — M5116 Intervertebral disc disorders with radiculopathy, lumbar region: Principal | ICD-10-CM | POA: Diagnosis present

## 2015-04-12 DIAGNOSIS — M4806 Spinal stenosis, lumbar region: Secondary | ICD-10-CM | POA: Diagnosis present

## 2015-04-12 DIAGNOSIS — Z79899 Other long term (current) drug therapy: Secondary | ICD-10-CM | POA: Diagnosis not present

## 2015-04-12 DIAGNOSIS — Z419 Encounter for procedure for purposes other than remedying health state, unspecified: Secondary | ICD-10-CM

## 2015-04-12 HISTORY — PX: LUMBAR LAMINECTOMY WITH COFLEX 2 LEVEL: SHX6515

## 2015-04-12 SURGERY — LUMBAR LAMINECTOMY WITH COFLEX 2 LEVEL
Anesthesia: General | Site: Back

## 2015-04-12 MED ORDER — LIDOCAINE HCL (CARDIAC) 20 MG/ML IV SOLN
INTRAVENOUS | Status: DC | PRN
  Administered 2015-04-12: 80 mg via INTRAVENOUS

## 2015-04-12 MED ORDER — CELECOXIB 200 MG PO CAPS: 200.0000 mg | ORAL_CAPSULE | Freq: Two times a day (BID) | ORAL | Status: DC | PRN

## 2015-04-12 MED ORDER — KETOROLAC TROMETHAMINE 15 MG/ML IJ SOLN
15.0000 mg | Freq: Four times a day (QID) | INTRAMUSCULAR | Status: DC
  Administered 2015-04-12 – 2015-04-13 (×4): 15 mg via INTRAVENOUS
  Filled 2015-04-12 (×3): qty 1

## 2015-04-12 MED ORDER — MORPHINE SULFATE (PF) 2 MG/ML IV SOLN
1.0000 mg | INTRAVENOUS | Status: DC | PRN
Start: 2015-04-12 — End: 2015-04-13

## 2015-04-12 MED ORDER — SIMVASTATIN 20 MG PO TABS
20.0000 mg | ORAL_TABLET | Freq: Every day | ORAL | Status: DC
  Administered 2015-04-12: 20 mg via ORAL
  Filled 2015-04-12: qty 1

## 2015-04-12 MED ORDER — GLYCOPYRROLATE 0.2 MG/ML IJ SOLN
INTRAMUSCULAR | Status: DC | PRN
  Administered 2015-04-12: 0.4 mg via INTRAVENOUS

## 2015-04-12 MED ORDER — ROCURONIUM BROMIDE 100 MG/10ML IV SOLN
INTRAVENOUS | Status: DC | PRN
  Administered 2015-04-12: 50 mg via INTRAVENOUS

## 2015-04-12 MED ORDER — POLYETHYLENE GLYCOL 3350 17 G PO PACK: 17.0000 g | PACK | Freq: Every day | ORAL | Status: DC | PRN

## 2015-04-12 MED ORDER — ACETAMINOPHEN 325 MG PO TABS
650.0000 mg | ORAL_TABLET | ORAL | Status: DC | PRN
  Administered 2015-04-12: 650 mg via ORAL
  Filled 2015-04-12: qty 2

## 2015-04-12 MED ORDER — MAGNESIUM CITRATE PO SOLN: 1.0000 | Freq: Once | ORAL | Status: DC | PRN

## 2015-04-12 MED ORDER — OXYCODONE-ACETAMINOPHEN 5-325 MG PO TABS: 1.0000 | ORAL_TABLET | ORAL | Status: DC | PRN

## 2015-04-12 MED ORDER — SODIUM CHLORIDE 0.9% FLUSH: 3.0000 mL | Freq: Two times a day (BID) | INTRAVENOUS | Status: DC

## 2015-04-12 MED ORDER — PROPOFOL 10 MG/ML IV BOLUS
INTRAVENOUS | Status: AC
  Filled 2015-04-12: qty 20

## 2015-04-12 MED ORDER — HYDROMORPHONE HCL 1 MG/ML IJ SOLN
0.2500 mg | INTRAMUSCULAR | Status: DC | PRN
  Administered 2015-04-12 (×2): 0.5 mg via INTRAVENOUS
  Administered 2015-04-12 (×2): 0.25 mg via INTRAVENOUS

## 2015-04-12 MED ORDER — ROCURONIUM BROMIDE 50 MG/5ML IV SOLN
INTRAVENOUS | Status: AC
  Filled 2015-04-12: qty 1

## 2015-04-12 MED ORDER — HYDROMORPHONE HCL 1 MG/ML IJ SOLN
INTRAMUSCULAR | Status: AC
  Administered 2015-04-12: 0.5 mg via INTRAVENOUS
  Filled 2015-04-12: qty 1

## 2015-04-12 MED ORDER — HYDROMORPHONE HCL 1 MG/ML IJ SOLN
INTRAMUSCULAR | Status: AC
  Administered 2015-04-12: 0.25 mg via INTRAVENOUS
  Filled 2015-04-12: qty 1

## 2015-04-12 MED ORDER — 0.9 % SODIUM CHLORIDE (POUR BTL) OPTIME
TOPICAL | Status: DC | PRN
  Administered 2015-04-12: 1000 mL

## 2015-04-12 MED ORDER — CEFAZOLIN SODIUM 1-5 GM-% IV SOLN
1.0000 g | Freq: Three times a day (TID) | INTRAVENOUS | Status: AC
  Administered 2015-04-12 (×2): 1 g via INTRAVENOUS
  Filled 2015-04-12 (×2): qty 50

## 2015-04-12 MED ORDER — DEXAMETHASONE SODIUM PHOSPHATE 10 MG/ML IJ SOLN
INTRAMUSCULAR | Status: AC
  Filled 2015-04-12: qty 1

## 2015-04-12 MED ORDER — LACTATED RINGERS IV SOLN
INTRAVENOUS | Status: DC | PRN
  Administered 2015-04-12 (×2): via INTRAVENOUS

## 2015-04-12 MED ORDER — SODIUM CHLORIDE 0.9 % IV SOLN: 250.0000 mL | INTRAVENOUS | Status: DC

## 2015-04-12 MED ORDER — SODIUM CHLORIDE 0.9 % IR SOLN
Status: DC | PRN
  Administered 2015-04-12: 11:00:00

## 2015-04-12 MED ORDER — LIDOCAINE HCL (CARDIAC) 20 MG/ML IV SOLN
INTRAVENOUS | Status: AC
  Filled 2015-04-12: qty 5

## 2015-04-12 MED ORDER — MIDAZOLAM HCL 2 MG/2ML IJ SOLN
INTRAMUSCULAR | Status: AC
  Filled 2015-04-12: qty 2

## 2015-04-12 MED ORDER — FENTANYL CITRATE (PF) 250 MCG/5ML IJ SOLN
INTRAMUSCULAR | Status: AC
  Filled 2015-04-12: qty 5

## 2015-04-12 MED ORDER — FENTANYL CITRATE (PF) 100 MCG/2ML IJ SOLN
INTRAMUSCULAR | Status: DC | PRN
Start: 2015-04-12 — End: 2015-04-12
  Administered 2015-04-12 (×3): 50 ug via INTRAVENOUS
  Administered 2015-04-12: 25 ug via INTRAVENOUS
  Administered 2015-04-12: 50 ug via INTRAVENOUS
  Administered 2015-04-12: 25 ug via INTRAVENOUS

## 2015-04-12 MED ORDER — MIDAZOLAM HCL 5 MG/5ML IJ SOLN
INTRAMUSCULAR | Status: DC | PRN
  Administered 2015-04-12: 2 mg via INTRAVENOUS

## 2015-04-12 MED ORDER — BUPIVACAINE HCL 0.5 % IJ SOLN
INTRAMUSCULAR | Status: DC | PRN
  Administered 2015-04-12: 20 mL

## 2015-04-12 MED ORDER — SODIUM CHLORIDE 0.9 % IJ SOLN
INTRAMUSCULAR | Status: AC
  Filled 2015-04-12: qty 10

## 2015-04-12 MED ORDER — THROMBIN 5000 UNITS EX SOLR
OROMUCOSAL | Status: DC | PRN
  Administered 2015-04-12: 11:00:00 via TOPICAL

## 2015-04-12 MED ORDER — LIDOCAINE-EPINEPHRINE 1 %-1:100000 IJ SOLN
INTRAMUSCULAR | Status: DC | PRN
  Administered 2015-04-12: 10 mL

## 2015-04-12 MED ORDER — PHENOL 1.4 % MT LIQD: 1.0000 | OROMUCOSAL | Status: DC | PRN

## 2015-04-12 MED ORDER — LEVOTHYROXINE SODIUM 75 MCG PO TABS
75.0000 ug | ORAL_TABLET | Freq: Every day | ORAL | Status: DC
  Administered 2015-04-13: 75 ug via ORAL
  Filled 2015-04-12: qty 1

## 2015-04-12 MED ORDER — ACETAMINOPHEN 650 MG RE SUPP: 650.0000 mg | RECTAL | Status: DC | PRN

## 2015-04-12 MED ORDER — DEXAMETHASONE SODIUM PHOSPHATE 10 MG/ML IJ SOLN
INTRAMUSCULAR | Status: DC | PRN
  Administered 2015-04-12: 10 mg via INTRAVENOUS

## 2015-04-12 MED ORDER — SENNA 8.6 MG PO TABS
1.0000 | ORAL_TABLET | Freq: Two times a day (BID) | ORAL | Status: DC
  Administered 2015-04-12: 8.6 mg via ORAL
  Filled 2015-04-12: qty 1

## 2015-04-12 MED ORDER — ONDANSETRON HCL 4 MG/2ML IJ SOLN: 4.0000 mg | Freq: Once | INTRAMUSCULAR | Status: DC

## 2015-04-12 MED ORDER — KETOROLAC TROMETHAMINE 15 MG/ML IJ SOLN
INTRAMUSCULAR | Status: AC
  Filled 2015-04-12: qty 1

## 2015-04-12 MED ORDER — NEOSTIGMINE METHYLSULFATE 10 MG/10ML IV SOLN
INTRAVENOUS | Status: DC | PRN
  Administered 2015-04-12: 3 mg via INTRAVENOUS

## 2015-04-12 MED ORDER — TRIAMCINOLONE ACETONIDE 55 MCG/ACT NA AERO
1.0000 | INHALATION_SPRAY | Freq: Every evening | NASAL | Status: DC | PRN
  Filled 2015-04-12: qty 10.8

## 2015-04-12 MED ORDER — BISACODYL 10 MG RE SUPP: 10.0000 mg | Freq: Every day | RECTAL | Status: DC | PRN

## 2015-04-12 MED ORDER — METHOCARBAMOL 1000 MG/10ML IJ SOLN
500.0000 mg | Freq: Four times a day (QID) | INTRAVENOUS | Status: DC | PRN
  Filled 2015-04-12: qty 5

## 2015-04-12 MED ORDER — SODIUM CHLORIDE 0.9% FLUSH: 3.0000 mL | INTRAVENOUS | Status: DC | PRN

## 2015-04-12 MED ORDER — MENTHOL 3 MG MT LOZG: 1.0000 | LOZENGE | OROMUCOSAL | Status: DC | PRN

## 2015-04-12 MED ORDER — PROPOFOL 10 MG/ML IV BOLUS
INTRAVENOUS | Status: DC | PRN
  Administered 2015-04-12: 150 mg via INTRAVENOUS

## 2015-04-12 MED ORDER — FAMOTIDINE 20 MG PO TABS
20.0000 mg | ORAL_TABLET | Freq: Two times a day (BID) | ORAL | Status: DC
  Administered 2015-04-12: 20 mg via ORAL
  Filled 2015-04-12: qty 1

## 2015-04-12 MED ORDER — THROMBIN 5000 UNITS EX SOLR
CUTANEOUS | Status: DC | PRN
  Administered 2015-04-12 (×2): 5000 [IU] via TOPICAL

## 2015-04-12 MED ORDER — ONDANSETRON HCL 4 MG/2ML IJ SOLN
INTRAMUSCULAR | Status: DC | PRN
  Administered 2015-04-12: 4 mg via INTRAVENOUS

## 2015-04-12 MED ORDER — METOPROLOL TARTRATE 1 MG/ML IV SOLN
INTRAVENOUS | Status: AC
  Filled 2015-04-12: qty 5

## 2015-04-12 MED ORDER — SUCCINYLCHOLINE CHLORIDE 20 MG/ML IJ SOLN
INTRAMUSCULAR | Status: AC
  Filled 2015-04-12: qty 1

## 2015-04-12 MED ORDER — HEMOSTATIC AGENTS (NO CHARGE) OPTIME
TOPICAL | Status: DC | PRN
  Administered 2015-04-12: 1 via TOPICAL

## 2015-04-12 MED ORDER — HYDROCODONE-ACETAMINOPHEN 5-325 MG PO TABS
1.0000 | ORAL_TABLET | ORAL | Status: DC | PRN
Start: 2015-04-12 — End: 2015-04-13
  Administered 2015-04-13: 1 via ORAL
  Filled 2015-04-12: qty 1

## 2015-04-12 MED ORDER — PROMETHAZINE HCL 25 MG PO TABS
12.5000 mg | ORAL_TABLET | Freq: Four times a day (QID) | ORAL | Status: DC | PRN
  Administered 2015-04-12: 12.5 mg via ORAL
  Filled 2015-04-12: qty 1

## 2015-04-12 MED ORDER — EPHEDRINE SULFATE 50 MG/ML IJ SOLN
INTRAMUSCULAR | Status: AC
  Filled 2015-04-12: qty 1

## 2015-04-12 MED ORDER — METHOCARBAMOL 500 MG PO TABS: 500.0000 mg | ORAL_TABLET | Freq: Four times a day (QID) | ORAL | Status: DC | PRN

## 2015-04-12 MED ORDER — BUPIVACAINE HCL (PF) 0.5 % IJ SOLN
INTRAMUSCULAR | Status: DC | PRN
  Administered 2015-04-12: 10 mL

## 2015-04-12 MED ORDER — ONDANSETRON HCL 4 MG/2ML IJ SOLN
4.0000 mg | INTRAMUSCULAR | Status: DC | PRN
  Administered 2015-04-12: 4 mg via INTRAVENOUS
  Filled 2015-04-12: qty 2

## 2015-04-12 MED ORDER — DOCUSATE SODIUM 100 MG PO CAPS
100.0000 mg | ORAL_CAPSULE | Freq: Two times a day (BID) | ORAL | Status: DC
  Administered 2015-04-12: 100 mg via ORAL
  Filled 2015-04-12: qty 1

## 2015-04-12 MED ORDER — ALUM & MAG HYDROXIDE-SIMETH 200-200-20 MG/5ML PO SUSP: 30.0000 mL | Freq: Four times a day (QID) | ORAL | Status: DC | PRN

## 2015-04-12 MED ORDER — ONDANSETRON HCL 4 MG/2ML IJ SOLN
INTRAMUSCULAR | Status: AC
  Filled 2015-04-12: qty 2

## 2015-04-12 SURGICAL SUPPLY — 51 items
BAG DECANTER FOR FLEXI CONT (MISCELLANEOUS) ×2 IMPLANT
BLADE CLIPPER SURG (BLADE) IMPLANT
BUR ACORN 6.0 (BURR) IMPLANT
BUR MATCHSTICK NEURO 3.0 LAGG (BURR) ×2 IMPLANT
CANISTER SUCT 3000ML PPV (MISCELLANEOUS) ×2 IMPLANT
DECANTER SPIKE VIAL GLASS SM (MISCELLANEOUS) ×2 IMPLANT
DERMABOND ADVANCED (GAUZE/BANDAGES/DRESSINGS) ×1
DERMABOND ADVANCED .7 DNX12 (GAUZE/BANDAGES/DRESSINGS) ×1 IMPLANT
DEVICE COFLEX STABLIZATION 8MM (Neuro Prosthesis/Implant) ×4 IMPLANT
DRAPE C-ARM 42X72 X-RAY (DRAPES) ×4 IMPLANT
DRAPE LAPAROTOMY T 102X78X121 (DRAPES) ×2 IMPLANT
DRAPE MICROSCOPE LEICA (MISCELLANEOUS) IMPLANT
DRAPE POUCH INSTRU U-SHP 10X18 (DRAPES) ×2 IMPLANT
DRAPE PROXIMA HALF (DRAPES) IMPLANT
DRSG OPSITE POSTOP 4X8 (GAUZE/BANDAGES/DRESSINGS) ×2 IMPLANT
DURAPREP 26ML APPLICATOR (WOUND CARE) ×2 IMPLANT
ELECT REM PT RETURN 9FT ADLT (ELECTROSURGICAL) ×2
ELECTRODE REM PT RTRN 9FT ADLT (ELECTROSURGICAL) ×1 IMPLANT
GAUZE SPONGE 4X4 12PLY STRL (GAUZE/BANDAGES/DRESSINGS) ×2 IMPLANT
GAUZE SPONGE 4X4 16PLY XRAY LF (GAUZE/BANDAGES/DRESSINGS) IMPLANT
GLOVE BIOGEL PI IND STRL 8.5 (GLOVE) ×1 IMPLANT
GLOVE BIOGEL PI INDICATOR 8.5 (GLOVE) ×1
GLOVE ECLIPSE 8.5 STRL (GLOVE) ×2 IMPLANT
GLOVE EXAM NITRILE LRG STRL (GLOVE) IMPLANT
GLOVE EXAM NITRILE MD LF STRL (GLOVE) IMPLANT
GLOVE EXAM NITRILE XL STR (GLOVE) IMPLANT
GLOVE EXAM NITRILE XS STR PU (GLOVE) IMPLANT
GOWN STRL REUS W/ TWL LRG LVL3 (GOWN DISPOSABLE) IMPLANT
GOWN STRL REUS W/ TWL XL LVL3 (GOWN DISPOSABLE) IMPLANT
GOWN STRL REUS W/TWL 2XL LVL3 (GOWN DISPOSABLE) ×2 IMPLANT
GOWN STRL REUS W/TWL LRG LVL3 (GOWN DISPOSABLE)
GOWN STRL REUS W/TWL XL LVL3 (GOWN DISPOSABLE)
HEMOSTAT POWDER SURGIFOAM 1G (HEMOSTASIS) ×2 IMPLANT
KIT BASIN OR (CUSTOM PROCEDURE TRAY) ×2 IMPLANT
KIT ROOM TURNOVER OR (KITS) ×2 IMPLANT
NEEDLE HYPO 22GX1.5 SAFETY (NEEDLE) ×2 IMPLANT
NEEDLE SPNL 20GX3.5 QUINCKE YW (NEEDLE) IMPLANT
NS IRRIG 1000ML POUR BTL (IV SOLUTION) ×2 IMPLANT
PACK LAMINECTOMY NEURO (CUSTOM PROCEDURE TRAY) ×2 IMPLANT
PAD ARMBOARD 7.5X6 YLW CONV (MISCELLANEOUS) ×6 IMPLANT
PATTIES SURGICAL .5 X1 (DISPOSABLE) IMPLANT
RUBBERBAND STERILE (MISCELLANEOUS) IMPLANT
SPONGE SURGIFOAM ABS GEL SZ50 (HEMOSTASIS) ×2 IMPLANT
SUT PROLENE 6 0 BV (SUTURE) ×2 IMPLANT
SUT VIC AB 1 CT1 18XBRD ANBCTR (SUTURE) ×1 IMPLANT
SUT VIC AB 1 CT1 8-18 (SUTURE) ×1
SUT VIC AB 2-0 CP2 18 (SUTURE) ×2 IMPLANT
SUT VIC AB 3-0 SH 8-18 (SUTURE) ×4 IMPLANT
TOWEL OR 17X24 6PK STRL BLUE (TOWEL DISPOSABLE) ×2 IMPLANT
TOWEL OR 17X26 10 PK STRL BLUE (TOWEL DISPOSABLE) ×2 IMPLANT
WATER STERILE IRR 1000ML POUR (IV SOLUTION) ×2 IMPLANT

## 2015-04-12 NOTE — Progress Notes (Signed)
Patient ID: Karen Ramirez, female   DOB: [birth year], 65 y.o.   MRN: [id number] Vital signs are stable. Patient has had nausea postoperatively She is been ambulatory Has noted some urinary incontinence Has some left perineal numbness Continue to observe overnight Stable postop

## 2015-04-12 NOTE — Anesthesia Procedure Notes (Signed)
Procedure Name: Intubation Date/Time: 04/12/2015 10:15 AM Performed by: Merrilyn Puma B Pre-anesthesia Checklist: Patient identified, Emergency Drugs available, Timeout performed, Suction available and Patient being monitored Patient Re-evaluated:Patient Re-evaluated prior to inductionOxygen Delivery Method: Circle system utilized Preoxygenation: Pre-oxygenation with 100% oxygen Intubation Type: IV induction Ventilation: Mask ventilation without difficulty Laryngoscope Size: Mac and 3 Grade View: Grade II Tube type: Oral Tube size: 7.0 mm Number of attempts: 1 Airway Equipment and Method: Stylet Placement Confirmation: CO2 detector,  positive ETCO2,  ETT inserted through vocal cords under direct vision and breath sounds checked- equal and bilateral Secured at: 21 cm Tube secured with: Tape Dental Injury: Teeth and Oropharynx as per pre-operative assessment

## 2015-04-12 NOTE — Anesthesia Postprocedure Evaluation (Signed)
Anesthesia Post Note  Patient: Karen Ramirez  Procedure(s) Performed: Procedure(s) (LRB): Lumbar two- three,  Lumbar three- four Laminectomy with Coflex (N/A)  Patient location during evaluation: PACU Anesthesia Type: General Level of consciousness: awake and alert Pain management: pain level controlled Vital Signs Assessment: post-procedure vital signs reviewed and stable Respiratory status: spontaneous breathing, nonlabored ventilation, respiratory function stable and patient connected to nasal cannula oxygen Cardiovascular status: blood pressure returned to baseline and stable Postop Assessment: no signs of nausea or vomiting Anesthetic complications: no    Last Vitals:  Filed Vitals:   04/12/15 1347 04/12/15 1402  BP: 118/64 129/60  Pulse: 86 83  Temp:    Resp: 20 11    Last Pain:  Filed Vitals:   04/12/15 1407  PainSc: 5                  Mieko Kneebone,JAMES TERRILL

## 2015-04-12 NOTE — Op Note (Signed)
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 endotracheal Indications: Karen Ramirez is a 65 year old individual who's had significant back and bilateral lower extremity pain. She has evidence of spondylitic disease in lower lumbar spine having had a previous decompression at L4-L5. She now has advanced stenosis at L2-3 and L3-4.  Procedure: The patient was brought to the operating room supine on a stretcher. After the smooth induction of general endotracheal anesthesia, she was turned prone. The back was prepped with alcohol DuraPrep and draped in a sterile fashion. A midline incision was created in the lower lumbar spine at the superior end of her previously placed incision. Dissection was carried down to the lumbar dorsal fascia which was opened on either side of the midline. The first spinous process with were identified as L3 and L4 fluoroscopically. Then the sub periosteal dissection was carried out at L2-3 and L3-4 to expose the laminar arches and spinous processes and facet joints at L2-3 and L3-4. At L3-4 bilateral laminotomies were created. High-speed drill was used to remove substantially thickened bone from the inferior margin lamina of health 3 out to and including a partial facetectomy at the L3-L4 level. At the left side there was a small inadvertent durotomy that was closed with a singular 6-0 Prolene suture that was noted to be watertight. The dissection then continued decompressing the common dural tube centrally and the path of the L3 nerve root superiorly and the L4 nerve root inferiorly this  was first done on the left side and was performed on the right side. Interspinous ligament was taken down in a trial size spacer was found to fit well within 9 mm spacer size. Attention was then turned to L3 for worse similar decompression was undertaken using large laminotomies decompress the L2 laminar arch centrally and the L2-3 facet laterally and inferiorly. Then care was taken to individually decompressed the L2 nerve roots bilaterally superiorly using a 2 mm straight and curved Hairston punch and a 2 mm and 3 mm Kerrison punch to decompress L3 inferiorly. As the decompressions were completed and was noted that each of the nerve roots L2-L3 and L4 were both decompressed and sounded easily out the foramen. Then the interspinous ligament was taken down at L2-3 and a 9 mm Coflex spacer was found to fit well into this interval. The Coflex spacers were then placed with the L3-4 spacer being placed first in the L2-3 spacer being placed on top of that. Bradycardia graphic confirmation of the position was obtained. The area was then inspected carefully the Coflex as were noted to be clamped down tightly to the spinous processes the common dural tube was well decompressed no spinal fluid leakage was noted and in the end the lumbar dorsal fascia was closed with #1 Vicryl interrupted fashion. 2-0 Vicryl was used in the subcutaneous anus tissues. 3-0 Vicryl subcuticularly. Blood loss was estimated at 200 mL. 20 mL of half percent Marcaine was injected into the paraspinous fascia prior to closure.

## 2015-04-12 NOTE — Anesthesia Preprocedure Evaluation (Addendum)
Anesthesia Evaluation  Patient identified by MRN, date of birth, ID band Patient awake    History of Anesthesia Complications (+) PONV  Airway Mallampati: II  TM Distance: >3 FB Neck ROM: Full    Dental  (+) Dental Advisory Given   Pulmonary neg pulmonary ROS,    breath sounds clear to auscultation       Cardiovascular  Rhythm:Regular Rate:Normal     Neuro/Psych    GI/Hepatic GERD  Medicated,  Endo/Other  Hypothyroidism   Renal/GU      Musculoskeletal  (+) Arthritis ,   Abdominal   Peds  Hematology   Anesthesia Other Findings   Reproductive/Obstetrics                            Anesthesia Physical Anesthesia Plan  ASA: II  Anesthesia Plan: General   Post-op Pain Management:    Induction: Intravenous  Airway Management Planned: Oral ETT  Additional Equipment:   Intra-op Plan:   Post-operative Plan: Extubation in OR  Informed Consent: I have reviewed the patients History and Physical, chart, labs and discussed the procedure including the risks, benefits and alternatives for the proposed anesthesia with the patient or authorized representative who has indicated his/her understanding and acceptance.   Dental advisory given  Plan Discussed with: Surgeon  Anesthesia Plan Comments:         Anesthesia Quick Evaluation

## 2015-04-12 NOTE — Progress Notes (Signed)
Orthopedic Tech Progress Note Patient Details:  Karen Ramirez 10-24-1950 [id number] Patient was a pre-fit. Patient ID: Karen Ramirez, female   DOB: 1950-11-04, 65 y.o.   MRN: [id number]   Braulio Bosch 04/12/2015, 3:40 PM

## 2015-04-12 NOTE — Transfer of Care (Signed)
Immediate Anesthesia Transfer of Care Note  Patient: Karen Ramirez  Procedure(s) Performed: Procedure(s) with comments: Lumbar two- three,  Lumbar three- four Laminectomy with Coflex (N/A) - L2-3 L3-4 Laminectomy with Coflex  Patient Location: PACU  Anesthesia Type:General  Level of Consciousness: awake, alert  and oriented  Airway & Oxygen Therapy: Patient Spontanous Breathing and Patient connected to nasal cannula oxygen  Post-op Assessment: Report given to RN, Post -op Vital signs reviewed and stable and Patient moving all extremities X 4  Post vital signs: Reviewed and stable  Last Vitals:  Filed Vitals:   04/12/15 0719  BP: 137/81  Pulse: 68  Temp: 36.7 C  Resp: 20    Complications: No apparent anesthesia complications

## 2015-04-12 NOTE — H&P (Signed)
CHIEF COMPLAINT:                                          Weakness, numbness, and tingling in the left lower leg upon standing and walking.   HISTORY OF PRESENT ILLNESS:                     [name] is a 65 year old, right-handed, individual who tells me that she has been having problems for at least four months now with walking any distance and particularly with standing. She would get pain that radiates into the left lower extremity. I had seen her back in 2007, at which point, I noted that she had some spondylytic stenosis, and I suggested an epidural steroid injection which she had done at that time. She did have good relief with her back and had done fairly well in the interim. Nonetheless, when these symptoms recurred, she was ultimately sent for an MRI of the lumbar spine which was performed at [facility] on 12/23/2014. This study demonstrates that there is quite significant advanced stenotic changes at L2-3 and at L3-4 with a central subligamentous protrusion of the disc, marked hypertrophy of the facet joints combining to form central and lateral recess stenosis at these two levels. L3-4 appears to be more involved. She also has a grade I spondylolisthesis that is noted at L2-3 and L3-4. At L4-5, she has advanced degenerative changes and she had a right-sided laminotomy back in 1989 by Dr. Anderson Malta for decompression of a disc herniation there. She also has some modest spondylytic changes at L5-S1 but no evidence of any nerve root entrapment there. Clinically, it seems that the symptoms are worsening with the passage of time, and she notes that walking any distance tends to cause the legs to feel weak.   REVIEW OF SYSTEMS:                                    Systems review is notable for wearing of glasses, ringing in the ears, sinus problems, high cholesterol, swelling in the feet, leg pain while walking, arm weakness, leg weakness, back pain, arm pain, joint pain and  swelling, some arthritis, left leg pain with exercise, and thyroid disease on a 14-point review sheet. PAST MEDICAL HISTORY:                                Her Past Medical History reveals  . Current Medical Conditions:  Some gastroesophageal reflux disease as a singular medical problem.  . Medications and Allergies:  Current medications include levothyroxine 75 mcg a day, ranitidine, aspirin, simvastatin, Celebrex, vitamin D3, Dristan Cold medication, and Nasacort. SHE NOTES ALLERGIES TO Z-PAK AND TRAMADOL.  SOCIAL HISTORY:                                            Her personal history reveals that she does not smoke. She drinks alcohol on rare social occasions. Height and weight have been stable at 5 feet 4 inches and 170 pounds.  PHYSICAL EXAMINATION:  On her Physical Examination, I note that she stands straight and erect without difficulty. On walking, she demonstrates some modest weakness in the gastroc on the left side, and in walking normally, she has a bit of a waddle as is notable for someone with some gluteal weakness. She also has a weakness in the quadricep on the left side, as she has difficulty stepping up onto a stool with that left leg. Tone and bulk appear to be intact, but reflexes are absent in the patellae, bilaterally, and the Achilles on the left. She does have a trace Achilles reflex on the right side. Upper extremity reflexes are 1+ and intact.  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 I noted that with the degree of stenosis that is there, ultimately, Karen Ramirez should undergo a decompression of both these levels. I am concerned about the spondylolisthesis, and I believe that an adequate decompression will require some substantial undercutting of the facet joint such that she would best be a candidate for some interspinous stabilization using a Coflex device. I  discussed this with her, and I noted that if it was not for this I would recommend a fusion at both those levels secondary to the spondylolisthesis. I did note that the Coflex would provide some stabilization in the midline so as to unload the facet joints and hopefully provide some longevity to the relief that she would get from the laminectomies. At this point, I believe that doing further epidural steroid injections is likely to be a futile effort. It may give some transient relief, if any at all, but ultimately, Karen Ramirez should undergo surgical decompression of the levels of L2-3 and L3-4. After some discussion, we advised and will plan on scheduling this at the earliest convenience.

## 2015-04-13 ENCOUNTER — Encounter (HOSPITAL_COMMUNITY): Payer: Self-pay | Admitting: Neurological Surgery

## 2015-04-13 MED ORDER — DIAZEPAM 5 MG PO TABS: 5.0000 mg | ORAL_TABLET | Freq: Four times a day (QID) | ORAL | Status: DC | PRN

## 2015-04-13 MED ORDER — HYDROCODONE-ACETAMINOPHEN 5-325 MG PO TABS: 1.0000 | ORAL_TABLET | ORAL | Status: DC | PRN

## 2015-04-13 NOTE — Progress Notes (Signed)
Pt and husband given D/C instructions with Rx's, verbal understanding was provided. Pt's Honeycomb dressing was removed prior to D/C per Dr. Ellene Route. Pt's incision is clean and dry with no sign of infection. Pt's IV was removed prior to D/C. Pt D/C'd home via wheelchair @ 1015 per MD order. Pt is stable @ D/C and has no other needs at this time. Holli Humbles, RN

## 2015-04-13 NOTE — Evaluation (Addendum)
Occupational Therapy Evaluation and Discharge Patient Details Name: Karen Ramirez MRN: [id number] DOB: 09/27/1950 Today's Date: 04/13/2015    History of Present Illness pt is a 66 y.o. female s/p bilateral laminotomies and decompression L2-3 and L3-4 placement of Coflex stabilization L2-3 L3-4 on 04/12/15. PMHx includes GERD and hypothyroidism.   Clinical Impression   Pt reports she was independent with ADLs PTA. Currently pt is overall supervision for safety with ADLs and functional mobility. All back, safety, and ADL education completed with pt and husband. Pt planning to d/c home with 24/7 supervision from her husband. No further acute OT needs identified; signing off at this time. Please re-consult if needs change. Thank you for this referral.    Follow Up Recommendations  No OT follow up;Supervision - Intermittent    Equipment Recommendations  None recommended by OT    Recommendations for Other Services       Precautions / Restrictions Precautions Precautions: Back Precaution Booklet Issued: Yes (comment) Precaution Comments: Reviewed back precautions with pt and husband. Pt able to maintain throughout functional activities during session. Required Braces or Orthoses: Spinal Brace Spinal Brace: Lumbar corset;Applied in sitting position Restrictions Weight Bearing Restrictions: No      Mobility Bed Mobility Overal bed mobility: Needs Assistance Bed Mobility: Supine to Sit     Supine to sit: Supervision     General bed mobility comments: Pt sitting EOB upon arrival.  Transfers Overall transfer level: Needs assistance Equipment used: None Transfers: Sit to/from Stand Sit to Stand: Supervision         General transfer comment: Supervision for safety. Good hand placement and technique.    Balance Overall balance assessment: Needs assistance Sitting-balance support: No upper extremity supported;Feet supported Sitting balance-Leahy Scale: Good      Standing balance support: No upper extremity supported;During functional activity Standing balance-Leahy Scale: Fair                              ADL Overall ADL's : Needs assistance/impaired Eating/Feeding: Set up;Sitting   Grooming: Supervision/safety;Standing Grooming Details (indicate cue type and reason): Educated on use of 2 cups for oral care. Upper Body Bathing: Set up;Sitting   Lower Body Bathing: Supervison/ safety;Sit to/from stand   Upper Body Dressing : Set up;Sitting   Lower Body Dressing: Supervision/safety;Sit to/from stand Lower Body Dressing Details (indicate cue type and reason): Educated on compensatory strategies for LB ADLs. Pt able to cross foot over opposite knee. Husband to assist as needed. Toilet Transfer: Supervision/safety;Ambulation;BSC Toilet Transfer Details (indicate cue type and reason): without use of hand rails to simulate home environment. Toileting- Clothing Manipulation and Hygiene: Supervision/safety;Sit to/from stand   Tub/ Shower Transfer: Supervision/safety;Walk-in shower;Ambulation;Shower seat   Functional mobility during ADLs: Supervision/safety General ADL Comments: Educated pt and husband on maintaining back precautions during functional activities, not sitting up for more than 70min-1 hour at a time, supervision for safety with shower transfers.     Vision     Perception     Praxis      Pertinent Vitals/Pain Pain Assessment: Faces Pain Score: 2  Faces Pain Scale: Hurts a little bit Pain Location: back Pain Descriptors / Indicators: Sore Pain Intervention(s): Limited activity within patient's tolerance;Monitored during session;Repositioned     Hand Dominance     Extremity/Trunk Assessment Upper Extremity Assessment Upper Extremity Assessment: Overall WFL for tasks assessed   Lower Extremity Assessment Lower Extremity Assessment: Defer to PT evaluation   Cervical /  Trunk Assessment Cervical / Trunk  Assessment: Normal   Communication Communication Communication: No difficulties   Cognition Arousal/Alertness: Awake/alert Behavior During Therapy: WFL for tasks assessed/performed Overall Cognitive Status: Within Functional Limits for tasks assessed                     General Comments       Exercises       Shoulder Instructions      Home Living Family/patient expects to be discharged to:: Private residence Living Arrangements: Spouse/significant other Available Help at Discharge: Family;Available 24 hours/day Type of Home: House Home Access: Stairs to enter [employer] of Steps: 2 Entrance Stairs-Rails: None Home Layout: Two level;Able to live on main level with bedroom/bathroom Alternate Level Stairs-Number of Steps: 15 Alternate Level Stairs-Rails: None Bathroom Shower/Tub: Occupational psychologist: Standard     Home Equipment: Shower seat - built in;Hand held shower head          Prior Functioning/Environment Level of Independence: Independent             OT Diagnosis: Acute pain   OT Problem List:     OT Treatment/Interventions:      OT Goals(Current goals can be found in the care plan section) Acute Rehab OT Goals Patient Stated Goal: to go home OT Goal Formulation: All assessment and education complete, DC therapy  OT Frequency:     Barriers to D/C:            Co-evaluation              End of Session Equipment Utilized During Treatment: Back brace  Activity Tolerance: Patient tolerated treatment well Patient left: in chair;with call bell/phone within reach;with family/visitor present   Time: 0810-0823 OT Time Calculation (min): 13 min Charges:  OT General Charges $OT Visit: 1 Procedure OT Evaluation $OT Eval Moderate Complexity: 1 Procedure G-Codes:     Binnie Kand M.S., OTR/L Pager: [phone]  04/13/2015, 10:40 AM

## 2015-04-13 NOTE — Discharge Instructions (Signed)
Wound Care °Leave incision open to air. °You may shower. °Do not scrub directly on incision.  °Do not put any creams, lotions, or ointments on incision. °Activity °Walk each and every day, increasing distance each day. °No lifting greater than 5 lbs.  Avoid excessive neck motion. °No driving for 2 weeks; may ride as a passenger locally. °Wear neck brace at all times except when showering.  If provided soft collar, may wear for comfort unless otherwise instructed. °Diet °Resume your normal diet.  °Return to Work °Will be discussed at you follow up appointment. °Call Your Doctor If Any of These Occur °Redness, drainage, or swelling at the wound.  °Temperature greater than 101 degrees. °Severe pain not relieved by pain medication. °Increased difficulty swallowing. °Incision starts to come apart. °Follow Up Appt °Call today for appointment in 4 weeks ([phone]) or for problems.  If you have any hardware placed in your spine, you will need an x-ray before your appointment. ° °Laminectomy °During a laminectomy, small pieces of bone in the spine called lamina are removed. The ligaments underneath the lamina and parts of the joints that have grown too large are also removed. This takes pressure off the nerves.  °LET YOUR HEALTH CARE PROVIDER KNOW ABOUT: °· Any allergies you have. °· All medicines you are taking, including vitamins, herbs, eye drops, creams, and over-the-counter medicines. °· Previous problems you or members of your family have had with the use of anesthetics. °· Any blood disorders you have. °· Previous surgeries you have had. °· Medical conditions you have. °RISKS AND COMPLICATIONS  °Generally, laminectomy is a safe procedure. However, as with any procedure, complications can occur. Possible complications include: °· Infection near the incision. °· Nerve damage. Signs of this can be pain, weakness, or numbness. °· Leaking of spinal fluid. °· Blood clot in a leg. The clot can move to the lungs. This can be  very serious. °· Bowel or bladder incontinence (rare). °BEFORE THE PROCEDURE  °· You will need to stop taking certain medicines as directed by your health care provider. °· If you smoke, stop at least 2 weeks before the procedure. Smoking can slow down the healing process and increase the risk of complications. °· Do not eat or drink anything for at least 8 hours before the procedure. Take any medicines that your health care provider tells you to keep taking with a sip of water. °· Do not drink alcohol the day before your surgery. °· Tell your health care provider if you develop a cold or any infection before your surgery. °· Arrange for someone to drive you home after the procedure or after your hospital stay. Also arrange for someone to help you with activities during recovery. °PROCEDURE °· Small monitors will be placed on your body. They are used to check your heart, blood pressure, and oxygen level. °· An IV tube will be inserted into one of your veins. Medicine will flow directly into your body through the IV tube. °· You might be given a sedative. This will help you relax. °· You will be given a medicine to make you sleep (general anesthetic), and a breathing tube will be placed into your lungs. During general anesthesia, you are unaware of the procedure and do not feel any pain. °· Your back will be cleaned with a special solution to kill germs on your skin. °· Once you are asleep, the surgeon will make a 2-inch to 5-inch cut (incision) in your back. The length of the incision   will depend on how many spinal bones (vertebrae) are being operated on. °· Muscles in the back will be moved away from the vertebrae and pulled to the side. °· Pieces of lamina will be removed. °· The ligament that lies under the lamina and connects your vertebrae will be removed. °· Enough ligaments and thickened joints will be removed to take pressure off your nerves. °· Your nerves will be identified, and their passage will be  tracked and assessed for excessive tightness. °· Your back muscles will be moved back into their normal position. °· The area under your skin will be closed with small, absorbable stitches. These stitches do not need to be removed. °· Your skin will be closed with small absorbable stitches or staples. °· A dressing will be put over your incision. °· The procedure may take 1-3 hours. °AFTER THE PROCEDURE  °· You will stay in a recovery area until the anesthesia has worn off. Your blood pressure and pulse will be checked every so often. Then you will be taken to a hospital room. °· You may continue to get fluids through the IV tube for a while. °· Some pain is normal. You may be given pain medicine while still in the recovery area. °· It is important to be up and moving as soon as possible after a surgery. Physical therapists will help you start walking. °· To prevent blood clots in your legs: °¨ You may be given special stockings to wear. °¨ You may need to take medicine to prevent clots. °· You may be asked to do special breathing exercises to re-expand your lungs. This is to prevent a lung infection. °· Most people stay in the hospital for 1-3 days after a laminectomy. °  °This information is not intended to replace advice given to you by your health care provider. Make sure you discuss any questions you have with your health care provider. °  °Document Released: 12/06/2008 Document Revised: 10/08/2012 Document Reviewed: 07/30/2012 °Elsevier Interactive Patient Education ©2016 [employer] ° °

## 2015-04-13 NOTE — Evaluation (Signed)
Physical Therapy Evaluation Patient Details Name: Karen Ramirez MRN: [id number] DOB: [birth year] Today's Date: 04/13/2015   History of Present Illness  pt is a 65 y.o. female s/p bilateral laminotomies and decompression L2-3 and L3-4 placement of Coflex stabilization L2-3 L3-4 on 04/12/15. PMHx includes GERD and hypothyroidism.  Clinical Impression  Pt was pleasant and willing to work with therapy. Pt moved well, requiring initial cues for bed mobility and posture during ambulation. Educated pt on bed mobility and back precautions, but requires supervision to ensure that she is following all the precautions. Husband present for parts of the session and noticed that pt needs increased awareness of maintaining precautions. Pt has decreased endurance and balance. Pt would benefit from acute therapy to increase her endurance and balance in order to increase her independence. Follow up recommendations are listed below and were discussed with pt.    Follow Up Recommendations No PT follow up;Supervision for mobility/OOB    Equipment Recommendations  None recommended by PT    Recommendations for Other Services       Precautions / Restrictions Precautions Precautions: Back Precaution Booklet Issued: Yes (comment) Restrictions Weight Bearing Restrictions: No      Mobility  Bed Mobility Overal bed mobility: Needs Assistance Bed Mobility: Supine to Sit     Supine to sit: Supervision     General bed mobility comments: educated pt on log roll. supervision for safety  Transfers Overall transfer level: Needs assistance Equipment used: None Transfers: Sit to/from Stand Sit to Stand: Supervision         General transfer comment: cues for hand position. supervision for safety.  Ambulation/Gait Ambulation/Gait assistance: Min guard Ambulation Distance (Feet): 400 Feet Assistive device: None Gait Pattern/deviations: Step-through pattern   Gait velocity interpretation: Below  normal speed for age/gender General Gait Details: cues for posture and to look forward.  Stairs Stairs: Yes Stairs assistance: Min assist Stair Management: No rails;Alternating pattern;Forwards Number of Stairs: 10 General stair comments: educated pt on sequence and having handheld assist when ascending and descending stairs.  Wheelchair Mobility    Modified Rankin (Stroke Patients Only)       Balance Overall balance assessment: Needs assistance   Sitting balance-Leahy Scale: Good       Standing balance-Leahy Scale: Fair                               Pertinent Vitals/Pain Pain Assessment: 0-10 Pain Score: 2  Pain Location: back Pain Descriptors / Indicators: Aching Pain Intervention(s): Limited activity within patient's tolerance;Monitored during session;Repositioned    Home Living Family/patient expects to be discharged to:: Private residence Living Arrangements: Spouse/significant other Available Help at Discharge: Family Type of Home: House Home Access: Stairs to enter Entrance Stairs-Rails: None Entrance Stairs-Number of Steps: 2 Home Layout: Two level;Able to live on main level with bedroom/bathroom Home Equipment: None;Tub bench      Prior Function Level of Independence: Independent               Hand Dominance        Extremity/Trunk Assessment   Upper Extremity Assessment: Defer to OT evaluation           Lower Extremity Assessment: Overall WFL for tasks assessed      Cervical / Trunk Assessment: Normal  Communication   Communication: No difficulties  Cognition  General Comments      Exercises        Assessment/Plan    PT Assessment Patient needs continued PT services  PT Diagnosis Difficulty walking;Acute pain   PT Problem List Decreased activity tolerance;Decreased balance;Decreased mobility;Decreased range of motion;Pain  PT Treatment Interventions Gait training;Stair  training;Functional mobility training;Therapeutic activities;Balance training   PT Goals (Current goals can be found in the Care Plan section) Acute Rehab PT Goals Patient Stated Goal: none stated    Frequency Min 5X/week   Barriers to discharge        Co-evaluation               End of Session Equipment Utilized During Treatment: Gait belt Activity Tolerance: Patient tolerated treatment well;No increased pain Patient left: in chair;with call bell/phone within reach;with family/visitor present           Time:  -      Charges:         PT G CodesHaze Justin 19-Apr-2015, 9:36 AM   Haze Justin, SPT [phone]

## 2015-04-15 NOTE — Discharge Summary (Signed)
Physician Discharge Summary  Patient ID: Karen Ramirez MRN: [id number] DOB/AGE: Mar 04, 1950 65 y.o.  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 rate 18, height 5\' 4"  (1.626 m), weight 78.427 kg (172 lb 14.4 oz), SpO2 95 %. Incision is clean and dry and motor function is intact in lower extremities  Disposition: 01-Home or Self Care  Discharge Instructions    Call MD for:  redness, tenderness, or signs of infection (pain, swelling, redness, odor or green/yellow discharge around incision site)    Complete by:  As directed      Call MD for:  severe uncontrolled pain    Complete by:  As directed      Call MD for:  temperature >100.4    Complete by:  As directed      Diet - low sodium heart healthy    Complete by:  As directed      Discharge instructions    Complete by:  As directed   Okay to shower. Do not apply salves or appointments to incision. No heavy lifting with the upper extremities greater than 15 pounds. May resume driving when not requiring pain medication and patient feels comfortable with doing so.     Increase activity slowly    Complete by:  As directed             Medication List    TAKE these medications        aspirin 81 MG tablet  Take 81 mg by mouth daily.     celecoxib 200  MG capsule  Commonly known as:  CELEBREX  Take 200 mg by mouth 2 (two) times daily as needed for mild pain.     diazepam 5 MG tablet  Commonly known as:  VALIUM  Take 1 tablet (5 mg total) by mouth every 6 (six) hours as needed for anxiety.     HYDROcodone-acetaminophen 5-325 MG tablet  Commonly known as:  NORCO/VICODIN  Take 1-2 tablets by mouth every 4 (four) hours as needed (mild pain).     IBUPROFEN COLD & SINUS PO  Take 1 tablet by mouth daily as needed.     levothyroxine 75 MCG tablet  Commonly known as:  SYNTHROID, LEVOTHROID  Take 75 mcg by mouth daily before breakfast.     ranitidine 150 MG tablet  Commonly known as:  ZANTAC  Take 150 mg by mouth 2 (two) times daily.     simvastatin 20 MG tablet  Commonly known as:  ZOCOR  Take 20 mg by mouth at bedtime.     triamcinolone 55 MCG/ACT Aero nasal inhaler  Commonly known as:  NASACORT  Place 1 spray into the nose at bedtime as needed.     Vitamin D3 2000 units Tabs  Take 1 tablet by mouth daily.           Follow-up Information    Follow up with Earleen Newport, MD In 3 weeks.   Specialty:  Neurosurgery   Contact information:   [address] [place] [address] [phone]       Signed: Earleen Newport 04/15/2015, 12:23 PM

## 2015-09-15 ENCOUNTER — Other Ambulatory Visit: Payer: Self-pay | Admitting: Neurological Surgery

## 2015-09-15 DIAGNOSIS — M48062 Spinal stenosis, lumbar region with neurogenic claudication: Secondary | ICD-10-CM

## 2015-09-22 ENCOUNTER — Ambulatory Visit: Payer: Medicare Other | Attending: Rheumatology | Admitting: Occupational Therapy

## 2015-09-22 DIAGNOSIS — R208 Other disturbances of skin sensation: Secondary | ICD-10-CM

## 2015-09-22 DIAGNOSIS — M79641 Pain in right hand: Secondary | ICD-10-CM | POA: Insufficient documentation

## 2015-09-22 NOTE — Therapy (Signed)
[facility] [address] [place], [address] Phone: [phone]   Fax:  [phone]  Occupational Therapy Treatment  Patient Details  Name: Karen Ramirez MRN: [id number] Date of Birth: 02-13-1950 Referring Provider: Jefm Bryant  Encounter Date: 09/22/2015      OT End of Session - 09/22/15 1907    Visit Number 1   Number of Visits 4   Date for OT Re-Evaluation 10/20/15   OT Start Time 1414   OT Stop Time 1515   OT Time Calculation (min) 61 min   Activity Tolerance Patient tolerated treatment well   Behavior During Therapy [facility] for tasks assessed/performed      Past Medical History:  Diagnosis Date  . Arthritis   . Complication of anesthesia   . DDD (degenerative disc disease), lumbar   . GERD (gastroesophageal reflux disease)   . Hypercholesteremia   . Hypothyroidism   . PONV (postoperative nausea and vomiting)   . Wears glasses     Past Surgical History:  Procedure Laterality Date  . ABDOMINAL HYSTERECTOMY  1986  . BACK SURGERY    . BLEPHAROPLASTY     both eyes  . COLONOSCOPY    . ESOPHAGOGASTRODUODENOSCOPY    . LUMBAR LAMINECTOMY  1989  . LUMBAR LAMINECTOMY WITH COFLEX 2 LEVEL N/A 04/12/2015   Procedure: Lumbar two- three,  Lumbar three- four Laminectomy with Coflex;  Surgeon: Kristeen Miss, MD;  Location: [place] [facility];  Service: Neurosurgery;  Laterality: N/A;  L2-3 L3-4 Laminectomy with Coflex  . SHOULDER ARTHROSCOPY WITH BICEPSTENOTOMY Right 05/10/2014   Procedure: SHOULDER ARTHROSCOPY WITH BICEPSTENOTOMY;  Surgeon: Tania Ade, MD;  Location: [place];  Service: Orthopedics;  Laterality: Right;  . SHOULDER ARTHROSCOPY WITH DISTAL CLAVICLE RESECTION Right 05/10/2014   Procedure: SHOULDER ARTHROSCOPY WITH DISTAL CLAVICLE RESECTION DEBRIDEMENT OF ROTATOR CUFF TEAR;  Surgeon: Tania Ade, MD;  Location: [place];  Service: Orthopedics;  Laterality: Right;   . SHOULDER ARTHROSCOPY WITH SUBACROMIAL DECOMPRESSION Right 05/10/2014   Procedure: SHOULDER ARTHROSCOPY WITH SUBACROMIAL DECOMPRESSION;  Surgeon: Tania Ade, MD;  Location: [place];  Service: Orthopedics;  Laterality: Right;  . THUMB ARTHROSCOPY  3/16   left  . VEIN LIGATION     left    There were no vitals filed for this visit.      Subjective Assessment - 09/22/15 1858    Subjective  My numbness started about 3-4 months ago - but trigger finger had that for few years - this was my 2nd shot - and then after predisone Dr gave me last week - numbness is better ,  still come and goes - and  3rd finger still clicking    Patient Stated Goals Want to get the numbness better during styling my hair, and do my makeup  - to use my hand in writing, caligraphy, open jars with less pain and easier    Currently in Pain? No/denies            [facility] OT Assessment - 09/22/15 0001      Assessment   Diagnosis R CTS , pain and numbness    Referring Provider Jefm Bryant   Onset Date 05/02/15     Home  Environment   Lives With Spouse     Prior Function   Vocation Retired   Leisure R hand dominant - retired Therapist, sports - likes to play with 27yrs old granddaughter, work on laptop, read, travel , write caligraphy , -  take care of her 32yrs old parents 3 x wk      AROM   Overall AROM Comments Pt show AROM in wrist and digits WNL -and strength at wrist 5-/5     Strength   Right Hand Grip (lbs) 47   Right Hand Lateral Pinch 17 lbs   Right Hand 3 Point Pinch 13 lbs   Left Hand Grip (lbs) 30   Left Hand Lateral Pinch 12 lbs   Left Hand 3 Point Pinch 12 lbs      see pt instruction                    OT Education - 09/22/15 1907    Education provided Yes   Education Details Findings and HEP   Person(s) Educated Patient   Methods Explanation;Demonstration;Tactile cues;Verbal cues;Handout   Comprehension Verbal cues required;Verbalized understanding;Returned  demonstration          OT Short Term Goals - 09/22/15 1914      OT SHORT TERM GOAL #1   Title Pt to be ind in HEP to decrease sensory symptoms  during ADL's and IADL's activities    Baseline during driving , fixing her hair and makeup - increase pain wth writing    Time 2   Period Weeks   Status New           OT Long Term Goals - 09/22/15 1915      OT LONG TERM GOAL #1   Title Quickdash score improve with 15 points    Baseline 38.6 score on Quickdash   Time 4   Period Weeks   Status New               Plan - 09/22/15 1908    Clinical Impression Statement Pt present this date with symptoms that is not as bad as seen last week with appt with MD - had predisone and shot for A1pulley at 3rd - numbness only with certain tasks - pt tender over cubital tunnel - but no Tinel - and then Phalan's positive for CT but not positive Tinel -  pt still triggering in 3rd  digit - but not locking -  pt upper traps tight and show decrease cervical AROM in all planes - AROM at wrist and digits WNL - grip and prehension strength within range for her age - pt was provided HEP and she has Monday appt for nerve conduction test    Rehab Potential Fair   OT Frequency 1x / week   OT Duration 4 weeks   OT Treatment/Interventions Contrast Bath;Splinting;Patient/family education;Self-care/ADL training;Manual Therapy;Therapeutic exercises;Electrical Stimulation   Plan results from nerve conduction test - how did with HEP and symptoms ??   OT Home Exercise Plan see pt instruction    Consulted and Agree with Plan of Care Patient      Patient will benefit from skilled therapeutic intervention in order to improve the following deficits and impairments:  Impaired sensation, Decreased knowledge of use of DME, Impaired UE functional use, Pain  Visit Diagnosis: Other disturbances of skin sensation - Plan: Ot plan of care cert/re-cert  Pain in right hand - Plan: Ot plan of care  cert/re-cert    Problem List Patient Active Problem List   Diagnosis Date Noted  . Lumbar stenosis with neurogenic claudication 04/12/2015    Rosalyn Gess OTR/l,CLT 09/22/2015, 7:21 PM  [facility] [facility] [address] [place], [address] Phone: [phone]  Fax:  [phone]  Name: Karen Ramirez MRN: [id number] Date of Birth: Jun 18, 1950

## 2015-09-22 NOTE — Patient Instructions (Signed)
Ed on modifications and positions or act to avoid for cubital tunnel and CT MEd and ulnar N glides - 5 reps - 2-3 x day   MC block splint for 3rd digit to wear during tight or sustained grip act And sleeping   Wrist splint most all day during act  Contrast for R hand and wrist 2-3 x day

## 2015-09-27 ENCOUNTER — Ambulatory Visit
Admission: RE | Admit: 2015-09-27 | Discharge: 2015-09-27 | Disposition: A | Discharge status: Home / Self Care | Payer: Medicare Other | Source: Ambulatory Visit | Attending: Neurological Surgery | Admitting: Neurological Surgery

## 2015-09-27 DIAGNOSIS — M4806 Spinal stenosis, lumbar region: Secondary | ICD-10-CM | POA: Insufficient documentation

## 2015-09-27 DIAGNOSIS — M899 Disorder of bone, unspecified: Secondary | ICD-10-CM | POA: Diagnosis not present

## 2015-09-27 DIAGNOSIS — M7138 Other bursal cyst, other site: Secondary | ICD-10-CM | POA: Diagnosis not present

## 2015-09-27 DIAGNOSIS — M5136 Other intervertebral disc degeneration, lumbar region: Secondary | ICD-10-CM | POA: Diagnosis not present

## 2015-09-27 DIAGNOSIS — M2578 Osteophyte, vertebrae: Secondary | ICD-10-CM | POA: Diagnosis not present

## 2015-09-27 DIAGNOSIS — M48062 Spinal stenosis, lumbar region with neurogenic claudication: Secondary | ICD-10-CM

## 2015-09-27 DIAGNOSIS — M5126 Other intervertebral disc displacement, lumbar region: Secondary | ICD-10-CM | POA: Insufficient documentation

## 2015-09-27 LAB — POCT I-STAT CREATININE: Creatinine, Ser: 0.9 mg/dL (ref 0.44–1.00)

## 2015-09-27 MED ORDER — GADOBENATE DIMEGLUMINE 529 MG/ML IV SOLN
20.0000 mL | Freq: Once | INTRAVENOUS | Status: AC | PRN
  Administered 2015-09-27: 16 mL via INTRAVENOUS

## 2015-09-30 ENCOUNTER — Ambulatory Visit: Payer: Medicare Other | Admitting: Occupational Therapy

## 2015-10-06 ENCOUNTER — Ambulatory Visit: Payer: Medicare Other | Attending: Rheumatology | Admitting: Occupational Therapy

## 2015-10-06 DIAGNOSIS — R208 Other disturbances of skin sensation: Secondary | ICD-10-CM | POA: Diagnosis present

## 2015-10-06 DIAGNOSIS — M79641 Pain in right hand: Secondary | ICD-10-CM

## 2015-10-06 NOTE — Patient Instructions (Signed)
Cont at home Contrast   Med N glide and tendon glide  can stop Ulnar N glide  Modification and joint protection principles discuss - computer setup and phone use  Use larger  joints and avoid tight and sustained grip  Avoid positions that aggravate CT - reviewed  Use MC block splint during composite fist - to decrease trigger Wrist splint use  Contrast to do prior to med N glide  and tendon

## 2015-10-06 NOTE — Therapy (Signed)
[place] [facility] [address], [place], [address] Phone: [phone]   Fax:  [phone]  Occupational Therapy Treatment  Patient Details  Name: Karen Ramirez MRN: [id number] Date of Birth: 01-Jul-1950 Referring Provider: Jefm Bryant  Encounter Date: 10/06/2015      OT End of Session - 10/06/15 1623    Visit Number 2   Number of Visits 4   Date for OT Re-Evaluation 10/20/15   OT Start Time 1505   OT Stop Time 1545   OT Time Calculation (min) 40 min   Activity Tolerance Patient tolerated treatment well   Behavior During Therapy [facility] for tasks assessed/performed      Past Medical History:  Diagnosis Date  . Arthritis   . Complication of anesthesia   . DDD (degenerative disc disease), lumbar   . GERD (gastroesophageal reflux disease)   . Hypercholesteremia   . Hypothyroidism   . PONV (postoperative nausea and vomiting)   . Wears glasses     Past Surgical History:  Procedure Laterality Date  . ABDOMINAL HYSTERECTOMY  1986  . BACK SURGERY    . BLEPHAROPLASTY     both eyes  . COLONOSCOPY    . ESOPHAGOGASTRODUODENOSCOPY    . LUMBAR LAMINECTOMY  1989  . LUMBAR LAMINECTOMY WITH COFLEX 2 LEVEL N/A 04/12/2015   Procedure: Lumbar two- three,  Lumbar three- four Laminectomy with Coflex;  Surgeon: Kristeen Miss, MD;  Location: [place] [facility];  Service: Neurosurgery;  Laterality: N/A;  L2-3 L3-4 Laminectomy with Coflex  . SHOULDER ARTHROSCOPY WITH BICEPSTENOTOMY Right 05/10/2014   Procedure: SHOULDER ARTHROSCOPY WITH BICEPSTENOTOMY;  Surgeon: Tania Ade, MD;  Location: [place];  Service: Orthopedics;  Laterality: Right;  . SHOULDER ARTHROSCOPY WITH DISTAL CLAVICLE RESECTION Right 05/10/2014   Procedure: SHOULDER ARTHROSCOPY WITH DISTAL CLAVICLE RESECTION DEBRIDEMENT OF ROTATOR CUFF TEAR;  Surgeon: Tania Ade, MD;  Location: [place];  Service: Orthopedics;  Laterality: Right;   . SHOULDER ARTHROSCOPY WITH SUBACROMIAL DECOMPRESSION Right 05/10/2014   Procedure: SHOULDER ARTHROSCOPY WITH SUBACROMIAL DECOMPRESSION;  Surgeon: Tania Ade, MD;  Location: [place];  Service: Orthopedics;  Laterality: Right;  . THUMB ARTHROSCOPY  3/16   left  . VEIN LIGATION     left    There were no vitals filed for this visit.      Subjective Assessment - 10/06/15 1621    Subjective  I had my nerve conduction test last week - moderate to severe CTS in R - was asking about surgery - but I do not want surgery yet- trigger finger still happening - and CTS maybe during day about 5 x when doing something - like doing my hair , or writing - but I feel my hands are better than they wore - more strength    Patient Stated Goals Want to get the numbness better during styling my hair, and do my makeup  - to use my hand in writing, caligraphy, open jars with less pain and easier    Currently in Pain? No/denies            [facility] OT Assessment - 10/06/15 0001      Strength   Right Hand Grip (lbs) 57   Right Hand Lateral Pinch 17 lbs   Right Hand 3 Point Pinch 14 lbs   Left Hand Grip (lbs) 50   Left Hand Lateral Pinch 13 lbs   Left Hand 3 Point Pinch 12 lbs  Grip strength and prehension strength assess AROM in digits and R thumb WNL  Strength in R thumb PA , RA and flexion - 5/5 No atrophy notice  Reviewed with pt Med N glide and tendon glide  can stop Ulnar N glide  Modification and joint protection principles discuss - computer setup and phone use  Use larger  joints and avoid tight and sustained grip  Avoid positions that aggravate CT - reviewed  Use MC block splint during composite fist - to decrease trigger Wrist splint use  Contrast to do prior to med N glide  and tendon   [place] at 20% , 1.0 intensity , 3.3MHZ for 6 min over A1 pulley for 3rd digit and over CT at end of session to decrease symptoms                       OT  Education - 10/06/15 1623    Education provided Yes   Education Details reinforce HEP    Person(s) Educated Patient   Methods Explanation;Demonstration;Tactile cues;Verbal cues   Comprehension Verbal cues required;Returned demonstration;Verbalized understanding          OT Short Term Goals - 10/06/15 1630      OT SHORT TERM GOAL #1   Title Pt to be ind in HEP to decrease sensory symptoms  during ADL's and IADL's activities    Baseline during driving , fixing her hair and makeup, writing - no pain but numbness    Time 2   Period Weeks   Status Partially Met           OT Long Term Goals - 10/06/15 1630      OT LONG TERM GOAL #1   Title Quickdash score improve with 15 points    Baseline 38.6 score on Quickdash   Time 3   Period Weeks   Status New               Plan - 10/06/15 1624    Clinical Impression Statement Pt cont to show AROM WNL in digits - and R thumb - grip strength increase in bilateral hands - report CTS during day about 4-5 x day - trigger finger still happening - but that is long standing problem - pt  still positve for Phalanes test at 40sec - reinforce again for pt to do contast , wear wrist splint - and implement joint protection and modifications to tasks     Rehab Potential Fair   OT Treatment/Interventions Contrast Bath;Splinting;Patient/family education;Self-care/ADL training;Manual Therapy;Therapeutic exercises;Electrical Stimulation   Plan Pt to contact in the next 2-3 wks if any issues - otherwise will discharge    OT Home Exercise Plan see pt instruction    Consulted and Agree with Plan of Care Patient      Patient will benefit from skilled therapeutic intervention in order to improve the following deficits and impairments:  Impaired sensation, Decreased knowledge of use of DME, Impaired UE functional use, Pain  Visit Diagnosis: Other disturbances of skin sensation  Pain in right hand    Problem List Patient Active Problem List    Diagnosis Date Noted  . Lumbar stenosis with neurogenic claudication 04/12/2015    Rosalyn Gess OTR/L,CLT 10/06/2015, 4:40 PM  [place] [facility] [address] [place] [address], [place], [address] Phone: [phone]   Fax:  [phone]  Name: Karen Ramirez MRN: [id number] Date of Birth: [birth year]

## 2015-11-15 ENCOUNTER — Other Ambulatory Visit: Payer: Self-pay | Admitting: Internal Medicine

## 2015-11-15 DIAGNOSIS — Z1231 Encounter for screening mammogram for malignant neoplasm of breast: Secondary | ICD-10-CM

## 2015-12-27 ENCOUNTER — Ambulatory Visit
Admission: RE | Admit: 2015-12-27 | Discharge: 2015-12-27 | Disposition: A | Discharge status: Home / Self Care | Payer: Medicare Other | Source: Ambulatory Visit | Attending: Internal Medicine | Admitting: Internal Medicine

## 2015-12-27 DIAGNOSIS — Z1231 Encounter for screening mammogram for malignant neoplasm of breast: Secondary | ICD-10-CM | POA: Insufficient documentation

## 2016-05-10 ENCOUNTER — Other Ambulatory Visit: Payer: Self-pay | Admitting: Internal Medicine

## 2016-05-10 DIAGNOSIS — N200 Calculus of kidney: Secondary | ICD-10-CM

## 2016-05-14 ENCOUNTER — Ambulatory Visit
Admission: RE | Admit: 2016-05-14 | Discharge: 2016-05-14 | Disposition: A | Discharge status: Home / Self Care | Payer: Medicare Other | Source: Ambulatory Visit | Attending: Internal Medicine | Admitting: Internal Medicine

## 2016-05-14 DIAGNOSIS — N200 Calculus of kidney: Secondary | ICD-10-CM | POA: Insufficient documentation

## 2016-10-15 LAB — BASIC METABOLIC PANEL
BUN: 17 (ref 4–21)
Creatinine: 1.1 (ref <=1.1)
GLUCOSE: 112
Potassium: 3.9 (ref 3.4–5.3)
SODIUM: 139 (ref 137–147)

## 2016-10-15 LAB — LIPID PANEL
Cholesterol: 164 (ref 0–200)
HDL: 57 (ref 35–70)
LDL CALC: 97
TRIGLYCERIDES: 49 (ref 40–160)

## 2016-11-13 ENCOUNTER — Other Ambulatory Visit: Payer: Self-pay | Admitting: Internal Medicine

## 2016-11-13 DIAGNOSIS — Z1231 Encounter for screening mammogram for malignant neoplasm of breast: Secondary | ICD-10-CM

## 2017-01-16 ENCOUNTER — Ambulatory Visit
Admission: RE | Admit: 2017-01-16 | Discharge: 2017-01-16 | Disposition: A | Discharge status: Home / Self Care | Payer: Medicare Other | Source: Ambulatory Visit | Attending: Internal Medicine | Admitting: Internal Medicine

## 2017-01-16 DIAGNOSIS — Z1231 Encounter for screening mammogram for malignant neoplasm of breast: Secondary | ICD-10-CM | POA: Insufficient documentation

## 2017-08-02 ENCOUNTER — Emergency Department: Payer: Medicare Other

## 2017-08-02 ENCOUNTER — Emergency Department
Admission: EM | Admit: 2017-08-02 | Discharge: 2017-08-02 | Disposition: A | Discharge status: Home / Self Care | Payer: Medicare Other | Attending: Emergency Medicine | Admitting: Emergency Medicine

## 2017-08-02 DIAGNOSIS — E039 Hypothyroidism, unspecified: Secondary | ICD-10-CM | POA: Diagnosis not present

## 2017-08-02 DIAGNOSIS — R109 Unspecified abdominal pain: Secondary | ICD-10-CM | POA: Diagnosis present

## 2017-08-02 DIAGNOSIS — Z79899 Other long term (current) drug therapy: Secondary | ICD-10-CM | POA: Diagnosis not present

## 2017-08-02 DIAGNOSIS — N200 Calculus of kidney: Secondary | ICD-10-CM | POA: Diagnosis not present

## 2017-08-02 LAB — BASIC METABOLIC PANEL
ANION GAP: 9 (ref 5–15)
BUN: 31 mg/dL — ABNORMAL HIGH (ref 8–23)
CALCIUM: 9.8 mg/dL (ref 8.9–10.3)
CO2: 27 mmol/L (ref 22–32)
Chloride: 105 mmol/L (ref 98–111)
Creatinine, Ser: 1.08 mg/dL — ABNORMAL HIGH (ref 0.44–1.00)
GFR, EST NON AFRICAN AMERICAN: 52 mL/min — AB (ref >60)
GLUCOSE: 168 mg/dL — AB (ref 70–99)
POTASSIUM: 4.1 mmol/L (ref 3.5–5.1)
Sodium: 141 mmol/L (ref 135–145)

## 2017-08-02 LAB — URINALYSIS, COMPLETE (UACMP) WITH MICROSCOPIC
Bacteria, UA: NONE SEEN
Bilirubin Urine: NEGATIVE
GLUCOSE, UA: NEGATIVE mg/dL
Ketones, ur: NEGATIVE mg/dL
Leukocytes, UA: NEGATIVE
Nitrite: NEGATIVE
PH: 5 (ref 5.0–8.0)
PROTEIN: NEGATIVE mg/dL
Specific Gravity, Urine: 1.013 (ref 1.005–1.030)

## 2017-08-02 LAB — CBC
HEMATOCRIT: 45.8 % (ref 35.0–47.0)
Hemoglobin: 15.5 g/dL (ref 12.0–16.0)
MCH: 31.9 pg (ref 26.0–34.0)
MCHC: 33.7 g/dL (ref 32.0–36.0)
MCV: 94.7 fL (ref 80.0–100.0)
PLATELETS: 258 10*3/uL (ref 150–440)
RBC: 4.84 MIL/uL (ref 3.80–5.20)
RDW: 13.7 % (ref 11.5–14.5)
WBC: 9.2 10*3/uL (ref 3.6–11.0)

## 2017-08-02 MED ORDER — ONDANSETRON 4 MG PO TBDP: 4.0000 mg | ORAL_TABLET | Freq: Four times a day (QID) | ORAL | 0 refills | Status: DC | PRN

## 2017-08-02 MED ORDER — MORPHINE SULFATE (PF) 2 MG/ML IV SOLN
INTRAVENOUS | Status: AC
  Filled 2017-08-02: qty 2

## 2017-08-02 MED ORDER — ONDANSETRON HCL 4 MG/2ML IJ SOLN
4.0000 mg | Freq: Once | INTRAMUSCULAR | Status: AC
  Administered 2017-08-02: 4 mg via INTRAVENOUS

## 2017-08-02 MED ORDER — ONDANSETRON HCL 4 MG/2ML IJ SOLN
INTRAMUSCULAR | Status: AC
  Filled 2017-08-02: qty 2

## 2017-08-02 MED ORDER — MORPHINE SULFATE (PF) 4 MG/ML IV SOLN
4.0000 mg | Freq: Once | INTRAVENOUS | Status: AC
  Administered 2017-08-02: 4 mg via INTRAVENOUS

## 2017-08-02 MED ORDER — TAMSULOSIN HCL 0.4 MG PO CAPS: 0.4000 mg | ORAL_CAPSULE | Freq: Every day | ORAL | 0 refills | Status: DC

## 2017-08-02 NOTE — ED Provider Notes (Signed)
[facility] Emergency Department Provider Note   ____________________________________________   First MD Initiated Contact with Patient 08/02/17 [phone]     (approximate)  I have reviewed the triage vital signs and the nursing notes.   HISTORY  Chief Complaint Flank Pain    HPI Karen Ramirez is a 67 y.o. female O about 9:30 PM last night began expensing pain in her right flank that feels like previous "kidney stone".  Sharp pain right flank.  Rather abrupt in onset.  Associated with this feeling of increased urinary frequency, urgency to urinate.  Sharp rather abrupt onset about 9:30 PM last night.  Noticed that when she was urinating a little while ago while in the ER she saw flight, but that she thought might be a kidney stone.  Her pain is actually quite better and she is reports her pain is essentially most gone as well as her nausea she is feeling much better but still has a little bit of a feeling of discomfort with urination slight urgency at this time.    Past Medical History:  Diagnosis Date  . Arthritis   . Complication of anesthesia   . DDD (degenerative disc disease), lumbar   . GERD (gastroesophageal reflux disease)   . Hypercholesteremia   . Hypothyroidism   . PONV (postoperative nausea and vomiting)   . Wears glasses     Patient Active Problem List   Diagnosis Date Noted  . Lumbar stenosis with neurogenic claudication 04/12/2015    Past Surgical History:  Procedure Laterality Date  . ABDOMINAL HYSTERECTOMY  1986  . BACK SURGERY    . BLEPHAROPLASTY     both eyes  . COLONOSCOPY    . ESOPHAGOGASTRODUODENOSCOPY    . LUMBAR LAMINECTOMY  1989  . LUMBAR LAMINECTOMY WITH COFLEX 2 LEVEL N/A 04/12/2015   Procedure: Lumbar two- three,  Lumbar three- four Laminectomy with Coflex;  Surgeon: Kristeen Miss, MD;  Location: [facility];  Service: Neurosurgery;  Laterality: N/A;  L2-3 L3-4 Laminectomy with Coflex  . SHOULDER ARTHROSCOPY  WITH BICEPSTENOTOMY Right 05/10/2014   Procedure: SHOULDER ARTHROSCOPY WITH BICEPSTENOTOMY;  Surgeon: Tania Ade, MD;  Location: [facility];  Service: Orthopedics;  Laterality: Right;  . SHOULDER ARTHROSCOPY WITH DISTAL CLAVICLE RESECTION Right 05/10/2014   Procedure: SHOULDER ARTHROSCOPY WITH DISTAL CLAVICLE RESECTION DEBRIDEMENT OF ROTATOR CUFF TEAR;  Surgeon: Tania Ade, MD;  Location: [facility];  Service: Orthopedics;  Laterality: Right;  . SHOULDER ARTHROSCOPY WITH SUBACROMIAL DECOMPRESSION Right 05/10/2014   Procedure: SHOULDER ARTHROSCOPY WITH SUBACROMIAL DECOMPRESSION;  Surgeon: Tania Ade, MD;  Location: [facility];  Service: Orthopedics;  Laterality: Right;  . THUMB ARTHROSCOPY  3/16   left  . VEIN LIGATION     left    Prior to Admission medications   Medication Sig Start Date End Date Taking? Authorizing Provider  carboxymethylcellulose (REFRESH PLUS) 0.5 % SOLN Place 1 drop into both eyes at bedtime.   Yes [provider]  celecoxib (CELEBREX) 200 MG capsule Take 200 mg by mouth 2 (two) times daily as needed for mild pain. Take 200 mg by mouth daily. May take second dose as needed for arthritis pain.   Yes [provider]  Cholecalciferol (VITAMIN D3) 5000 units CHEW Chew 1 tablet by mouth 2 (two) times a week. Monday and Friday   Yes [provider]  levothyroxine (SYNTHROID, LEVOTHROID) 75 MCG tablet Take 75 mcg by mouth daily before breakfast.   Yes [provider]  LYRICA 25 MG capsule Take 25 mg by mouth at bedtime. 07/15/17  Yes [provider]  predniSONE (STERAPRED UNI-PAK 21 TAB) 5 MG (21) TBPK tablet  07/30/17  Yes [provider]  Pseudoephedrine-Ibuprofen (IBUPROFEN COLD & SINUS PO) Take 1 tablet by mouth daily as needed (congestion).    Yes [provider]  ranitidine (ZANTAC) 150 MG tablet Take 150 mg by mouth 2 (two) times daily.   Yes [provider]  simvastatin (ZOCOR) 20 MG tablet Take 20 mg by mouth at bedtime.    Yes [provider]  triamcinolone (NASACORT) 55 MCG/ACT AERO nasal inhaler Place 1 spray into the nose at bedtime as needed (allergies).    Yes [provider]  diazepam (VALIUM) 5 MG tablet Take 1 tablet (5 mg total) by mouth every 6 (six) hours as needed for anxiety. Patient not taking: Reported on 08/02/2017 04/13/15   Kristeen Miss, MD  HYDROcodone-acetaminophen (NORCO/VICODIN) 5-325 MG tablet Take 1-2 tablets by mouth every 4 (four) hours as needed (mild pain). Patient not taking: Reported on 08/02/2017 04/13/15   Kristeen Miss, MD  ondansetron (ZOFRAN ODT) 4 MG disintegrating tablet Take 1 tablet (4 mg total) by mouth every 6 (six) hours as needed for nausea or vomiting. 08/02/17   Delman Kitten, MD  tamsulosin (FLOMAX) 0.4 MG CAPS capsule Take 1 capsule (0.4 mg total) by mouth daily. 08/02/17   Delman Kitten, MD    Allergies Azithromycin; Levaquin [levofloxacin]; and Ultram [tramadol]  Family History  Problem Relation Age of Onset  . Breast cancer Maternal Aunt 70  . Breast cancer Maternal Aunt 70    Social History Social History   Tobacco Use  . Smoking status: Never Smoker  . Smokeless tobacco: Never Used  Substance Use Topics  . Alcohol use: Yes    Comment: rare  . Drug use: No    Review of Systems Constitutional: No fever/chills Eyes: No visual changes. ENT: No sore throat. Cardiovascular: Denies chest pain. Respiratory: Denies shortness of breath. Gastrointestinal:   No diarrhea.  No constipation. Genitourinary: Slight sense of urinary urgency.  No foul odor.  Denies burning with urination. Musculoskeletal: Negative for back pain except the pain in the right flank sort of radiated to the right back earlier.. Skin: Negative for rash. Neurological: Negative for headaches, focal weakness or numbness.    ____________________________________________   PHYSICAL  EXAM:  VITAL SIGNS: ED Triage Vitals  Enc Vitals Group     BP 08/02/17 0516 (!) 175/96     Pulse Rate 08/02/17 0516 72     Resp 08/02/17 0516 18     Temp 08/02/17 0516 (!) 97.5 F (36.4 C)     Temp Source 08/02/17 0516 Oral     SpO2 08/02/17 0516 97 %     Weight 08/02/17 0513 170 lb (77.1 kg)     Height 08/02/17 0513 5\' 3"  (1.6 m)     Head Circumference --      Peak Flow --      Pain Score 08/02/17 0512 10     Pain Loc --      Pain Edu? --      Excl. in [place]? --     Constitutional: Alert and oriented. Well appearing and in no acute distress. Eyes: Conjunctivae are normal. Head: Atraumatic. Nose: No congestion/rhinnorhea. Mouth/Throat: Mucous membranes are moist. Neck: No stridor.   Cardiovascular: Normal rate, regular rhythm. Grossly normal heart sounds.  Good peripheral circulation. Respiratory: Normal respiratory  effort.  No retractions. Lungs CTAB. Gastrointestinal: Soft and nontender. No distention.  No CVA tenderness bilateral.  Of note the patient reports her pain and symptoms have improved greatly since she arrived to the ER by the time I examined her. Musculoskeletal: No lower extremity tenderness nor edema. Neurologic:  Normal speech and language. No gross focal neurologic deficits are appreciated.  Skin:  Skin is warm, dry and intact. No rash noted. Psychiatric: Mood and affect are normal. Speech and behavior are normal.  ____________________________________________   LABS (all labs ordered are listed, but only abnormal results are displayed)  Labs Reviewed  URINALYSIS, COMPLETE (UACMP) WITH MICROSCOPIC - Abnormal; Notable for the following components:      Result Value   Color, Urine YELLOW (*)    APPearance CLEAR (*)    Hgb urine dipstick LARGE (*)    All other components within normal limits  BASIC METABOLIC PANEL - Abnormal; Notable for the following components:   Glucose, Bld 168 (*)    BUN 31 (*)    Creatinine, Ser 1.08 (*)    GFR calc non Af Amer 52  (*)    All other components within normal limits  CBC   ____________________________________________  EKG   ____________________________________________  RADIOLOGY  Ct Renal Stone Study  Result Date: 08/02/2017 CLINICAL DATA:  Right flank pain.  Nausea and vomiting. EXAM: CT ABDOMEN AND PELVIS WITHOUT CONTRAST TECHNIQUE: Multidetector CT imaging of the abdomen and pelvis was performed following the standard protocol without IV contrast. COMPARISON:  CT 11/09/2012 FINDINGS: Lower chest: No pleural fluid or consolidation. Hepatobiliary: Decreased hepatic density consistent with steatosis. No discrete focal lesion. Gallbladder partially distended, no calcified stone. No biliary dilatation. Pancreas: Unremarkable. No pancreatic ductal dilatation or surrounding inflammatory changes. Spleen: Normal in size without focal abnormality. Adrenals/Urinary Tract: No adrenal nodule. Mild right hydronephrosis and proximal hydroureter. The distal ureter is nondistended, calcifications along the course of the ureter, all appear present on prior exam and are likely phleboliths. No definite ureteral calculus. Nonobstructing stone in the mid right kidney measures 10 mm. No left hydronephrosis or hydroureter. No left urolithiasis. Right greater than left perinephric edema. Urinary bladder is partially distended without stone or wall thickening. Stomach/Bowel: Stomach partially distended. No bowel wall thickening, inflammatory change or obstruction. Moderate stool burden throughout the colon. Diverticulosis of the descending and sigmoid colon. No diverticulitis. Sigmoid colonic tortuosity. Appendix is not definitively visualized common no pericecal or right lower quadrant inflammatory changes. Vascular/Lymphatic: Mild aortic tortuosity, no aneurysm. No enlarged abdominal or pelvic lymph nodes. Reproductive: Status post hysterectomy. No adnexal masses. Other: Small fat containing umbilical hernia. No free air, free fluid, or  intra-abdominal fluid collection. Musculoskeletal: There are no acute or suspicious osseous abnormalities. Posterior fusion in the lower lumbar spine. Slight progression of anterolisthesis of L2 on L3 from prior. IMPRESSION: 1. Mild right hydronephrosis and proximal hydroureter. No definite obstructing ureteral calculus. Multiple small calcifications in the region of the right distal ureter appears similar to prior CT and are likely phleboliths. Findings likely represent a recently passed stone. 2. Nonobstructing right renal stone measures 10 mm. 3. Hepatic steatosis. Colonic diverticulosis without diverticulitis. Electronically Signed   By: Jeb Levering M.D.   On: 08/02/2017 06:18    ____________________________________________   PROCEDURES  Procedure(s) performed: None  Procedures  Critical Care performed: No  ____________________________________________   INITIAL IMPRESSION / ASSESSMENT AND PLAN / ED COURSE  Pertinent labs & imaging results that were available during my care of the patient  were reviewed by me and considered in my medical decision making (see chart for details).  Differential diagnosis includes but is not limited to, abdominal perforation, aortic dissection, cholecystitis, appendicitis, diverticulitis, colitis, esophagitis/gastritis, kidney stone, pyelonephritis, urinary tract infection, aortic aneurysm. All are considered in decision and treatment plan. Based upon the patient's presentation and risk factors, appears most consistent with likely a passed stone.  She reports she urinated out what she felt was a small stone after arrival to ER and her pain is essentially resolved.  Urinalysis and CT findings are reassuring, seem to suggest likely a passed stone at this point.  Her pain is much better and she feels improved.   Return precautions and treatment recommendations and follow-up discussed with the patient who is agreeable with the plan.  Husband will be driving  her home.  She did request a prescription for Flomax, I have provided that she reports her last prescription was from a few years ago.  She has seen Dr. Yves Dill previously and plans to call him for follow-up.       ____________________________________________   FINAL CLINICAL IMPRESSION(S) / ED DIAGNOSES  Final diagnoses:  Kidney stones  Right flank pain      NEW MEDICATIONS STARTED DURING THIS VISIT:  New Prescriptions   ONDANSETRON (ZOFRAN ODT) 4 MG DISINTEGRATING TABLET    Take 1 tablet (4 mg total) by mouth every 6 (six) hours as needed for nausea or vomiting.   TAMSULOSIN (FLOMAX) 0.4 MG CAPS CAPSULE    Take 1 capsule (0.4 mg total) by mouth daily.     Note:  This document was prepared using Dragon voice recognition software and may include unintentional dictation errors.     Delman Kitten, MD 08/02/17 1350

## 2017-08-02 NOTE — Discharge Instructions (Addendum)
You have been seen in the Emergency Department (ED) today for pain that we believe based on your workup, is caused by kidney stones.  As we have discussed, please drink plenty of fluids.  Please make a follow up appointment with the physician(s) listed elsewhere in this documentation. ° ° °Return to the Emergency Department (ED) or call your doctor if you have any worsening pain, fever, painful urination, are unable to urinate, or develop other symptoms that concern you. ° °

## 2017-08-02 NOTE — ED Triage Notes (Signed)
Patient c/o right flank pain, N/V. Patient reports hx of 2 previous kidney stones, reports this feels similar.

## 2017-08-02 NOTE — ED Notes (Signed)
Urine sent at this time.

## 2017-08-22 ENCOUNTER — Ambulatory Visit: Payer: Medicare Other | Attending: Rheumatology

## 2017-08-22 DIAGNOSIS — M25552 Pain in left hip: Secondary | ICD-10-CM | POA: Diagnosis present

## 2017-08-22 DIAGNOSIS — M25652 Stiffness of left hip, not elsewhere classified: Secondary | ICD-10-CM | POA: Diagnosis present

## 2017-08-22 DIAGNOSIS — M6281 Muscle weakness (generalized): Secondary | ICD-10-CM | POA: Diagnosis present

## 2017-08-22 NOTE — Therapy (Signed)
[facility] [address], [place], [address] Phone: [phone]   Fax:  [phone]  Physical Therapy Evaluation  Patient Details  Name: Karen Ramirez MRN: [id number] Date of Birth: Feb 24, 1950 Referring Provider: Graciella Belton MD   Encounter Date: 08/22/2017  PT End of Session - 08/22/17 1340    Visit Number  1    Number of Visits  12    Date for PT Re-Evaluation  10/03/17    Authorization Type  1/ 10 G Code    PT Start Time  1300    PT Stop Time  1400    PT Time Calculation (min)  60 min    Activity Tolerance  Patient tolerated treatment well    Behavior During Therapy  [facility] for tasks assessed/performed       Past Medical History:  Diagnosis Date  . Arthritis   . Complication of anesthesia   . DDD (degenerative disc disease), lumbar   . GERD (gastroesophageal reflux disease)   . Hypercholesteremia   . Hypothyroidism   . PONV (postoperative nausea and vomiting)   . Wears glasses     Past Surgical History:  Procedure Laterality Date  . ABDOMINAL HYSTERECTOMY  1986  . BACK SURGERY    . BLEPHAROPLASTY     both eyes  . COLONOSCOPY    . ESOPHAGOGASTRODUODENOSCOPY    . LUMBAR LAMINECTOMY  1989  . LUMBAR LAMINECTOMY WITH COFLEX 2 LEVEL N/A 04/12/2015   Procedure: Lumbar two- three,  Lumbar three- four Laminectomy with Coflex;  Surgeon: Kristeen Miss, MD;  Location: [facility];  Service: Neurosurgery;  Laterality: N/A;  L2-3 L3-4 Laminectomy with Coflex  . SHOULDER ARTHROSCOPY WITH BICEPSTENOTOMY Right 05/10/2014   Procedure: SHOULDER ARTHROSCOPY WITH BICEPSTENOTOMY;  Surgeon: Tania Ade, MD;  Location: [facility];  Service: Orthopedics;  Laterality: Right;  . SHOULDER ARTHROSCOPY WITH DISTAL CLAVICLE RESECTION Right 05/10/2014   Procedure: SHOULDER ARTHROSCOPY WITH DISTAL CLAVICLE RESECTION DEBRIDEMENT OF ROTATOR CUFF TEAR;  Surgeon: Tania Ade, MD;  Location: [facility];  Service: Orthopedics;  Laterality: Right;  . SHOULDER ARTHROSCOPY WITH SUBACROMIAL DECOMPRESSION Right 05/10/2014   Procedure: SHOULDER ARTHROSCOPY WITH SUBACROMIAL DECOMPRESSION;  Surgeon: Tania Ade, MD;  Location: [facility];  Service: Orthopedics;  Laterality: Right;  . THUMB ARTHROSCOPY  3/16   left  . VEIN LIGATION     left    There were no vitals filed for this visit.   Subjective Assessment - 08/22/17 1314    Subjective  Patient presents with increased L hip pain after having surgery to decrease her lumbar stenosis in 2017. Patient reports she has had difficulty voiding and increased numbness and tingling/pain along the L hip. Patient reports increased pain and symptoms with laying onto the affected side, walking for longer than an hour, bending forward, and sitting for prolonged periods of time. Patient demonstrates decreased pain with rest and taking lyrica to help her sleep. Patient reports the pain has been improving the past 2 weeks but still continues to have increased pain with activity.     Pertinent History  Carpal Tunnel surgery, discectomy (lumbar), spacers to improve stenosis, bursa injection    Limitations  Sitting;Standing;Walking    How long can you walk comfortably?  1 hour     Patient Stated Goals  To decrease pain     Currently in Pain?  Yes    Pain Score  2    worst: 7/10;  best: 0/10   Pain Location  Hip    Pain Orientation  Left    Pain Descriptors / Indicators  Aching;Sharp    Pain Type  Chronic pain;Surgical pain    Pain Onset  More than a month ago    Pain Frequency  Intermittent         [facility] PT Assessment - 08/22/17 1318      Assessment   Medical Diagnosis  L hip pain    Referring Provider  [facility] MD    Onset Date/Surgical Date  01/02/15    Hand Dominance  Right    Next MD Visit  unknown    Prior Therapy  yes - hand/wrist OT      Balance Screen   Has the patient fallen in the past 6 months  No    Has the patient  had a decrease in activity level because of a fear of falling?   Yes    Is the patient reluctant to leave their home because of a fear of falling?   No      Home Film/video editor residence    Living Arrangements  Spouse/significant other    Available Help at Discharge  Family    Type of [place] to enter    Entrance Stairs-Number of Steps  3    Entrance Stairs-Rails  None    Home Layout  Two level    Alternate Level Stairs-Number of Steps  13    Alternate Level Stairs-Rails  Can reach both      Prior Function   Level of Independence  Independent    Vocation  Retired    [employer]  N/A    Leisure  Playing with grandchildren      Cognition   Overall Cognitive Status  Within Functional Limits for tasks assessed      Functional Tests   Functional tests  Squat      Squat   Comments  Poor technique - increased forward knee translation      ROM / Strength   AROM / PROM / Strength  AROM;Strength      AROM   AROM Assessment Site  Hip;Knee;Lumbar    Right/Left Hip  Right;Left    Right Hip Extension  5    Right Hip Flexion  110    Right Hip External Rotation   25    Right Hip Internal Rotation   20    Right Hip ABduction  30    Right Hip ADduction  15    Left Hip Extension  2    Left Hip Flexion  95    Left Hip External Rotation   25    Left Hip Internal Rotation   20    Left Hip ABduction  20    Left Hip ADduction  5    Right/Left Knee  Left;Right    Right Knee Extension  0    Right Knee Flexion  110    Left Knee Extension  0    Left Knee Flexion  110    Lumbar Flexion  WNL   Pull at end range   Lumbar Extension  25% limited    light pulling    Lumbar - Right Side Bend  WNL    Lumbar - Left Side Bend  25%   Increased pain   Lumbar - Right Rotation  WNL    Lumbar - Left  Rotation  WNL       Strength   Strength Assessment Site  Hip;Knee    Right/Left Hip  Right;Left    Right Hip Flexion  4-/5    Right  Hip Extension  3/5    Right Hip External Rotation   3+/5    Right Hip Internal Rotation  3+/5    Right Hip ABduction  4/5    Right Hip ADduction  3+/5    Left Hip Flexion  3+/5    Left Hip Extension  3-/5    Left Hip External Rotation  3/5    Left Hip Internal Rotation  3/5    Left Hip ABduction  3/5    Left Hip ADduction  2+/5    Right/Left Knee  Right;Left    Right Knee Flexion  4/5    Right Knee Extension  5/5    Left Knee Flexion  3/5    Left Knee Extension  3+/5      Palpation   Spinal mobility  L5-S1 hypomobility and pain     Palpation comment  TTP: Glute max, glute med, deep external rotators, multifidus on the L side       Special Tests    Special Tests  Lumbar    Lumbar Tests  Slump Test;Straight Leg Raise      Slump test   Findings  Negative    Comment  Bilateral      Straight Leg Raise   Findings  Positive    Side   Right    Comment  Increased pain      Ambulation/Gait   Gait velocity  1.36m/s    Gait Comments  Flat foot strike B, B trendleberg        Objective measurements completed on examination: See above findings.     TREATMENT: Therapeutic Exercise: Standing hip abduction -- x 15 Glute sets in supine -- x 15 5 sec holds  Patient demonstrates no increase in pain at the end of the session       PT Education - 08/22/17 1425    Education Details  HEP: hip abduction in standing, glute sets in supine    Person(s) Educated  Patient    Methods  Explanation;Demonstration;Handout    Comprehension  Verbalized understanding;Returned demonstration          PT Long Term Goals - 08/22/17 1426      PT LONG TERM GOAL #1   Title  Patient will be independent with HEP to continue benefits of therapy until after discharge.     Baseline  Dependent with form/technique    Time  6    Period  Weeks    Status  New    Target Date  10/03/17      PT LONG TERM GOAL #2   Title  Patient will improve FOTO scores from 39 to 55 to indicate significant  improvement with lumbar function and pain.     Baseline  FOTO: 39     Time  6    Period  Weeks    Status  New    Target Date  10/03/17      PT LONG TERM GOAL #3   Title  Patient will improve B hip strength along B LE to >4-/5 to indicate improvement with walking and strengthening.    Baseline  3/5-3+/5 for all hip movements    Time  6    Period  Weeks    Status  New  Target Date  10/03/17      PT LONG TERM GOAL #4   Title  Patient will have a worst pain of 1/10 in the hip to allow for improvements with comfort during walkign activities.     Baseline  7/10 worst pain    Time  6    Period  Weeks    Status  New    Target Date  10/03/17             Plan - 08/22/17 1407    Clinical Impression Statement  Patient is a 67 yo right hand dominant female presenting with increased LBP and L hip pain secondary to a lumbar decompression surgery in 2017. Patient demonstrates increased lumbar/hip dysfunction as indicated by her decreased FOTO score, decreased hip strength, increased pain upon palpation, and poor motor control along her glute musculature. Patient along demonstrates increased difficulties with functional movements such as walking for prolonged periods of time and squatting. Patient will benefit from further skilled therapy to return to prior level of function.     History and Personal Factors relevant to plan of care:  Previous lumbar decompression    Clinical Presentation  Stable    Clinical Presentation due to:  Pain has been improving    Clinical Decision Making  Low    Rehab Potential  Fair    Clinical Impairments Affecting Rehab Potential  (+) highly motivated (-) Chronicity of pain    PT Frequency  2x / week    PT Duration  6 weeks    PT Treatment/Interventions  Moist Heat;ADLs/Self Care Home Management;Cryotherapy;Electrical Stimulation;Ultrasound;Fluidtherapy;Iontophoresis 4mg /ml Dexamethasone;Gait training;Stair training;Functional mobility training;Therapeutic  activities;Therapeutic exercise;Balance training;Patient/family education;Neuromuscular re-education;Manual techniques;Dry needling;Passive range of motion    PT Next Visit Plan  Progress motor control, increase hip strength    PT Home Exercise Plan  See education    Consulted and Agree with Plan of Care  Patient       Patient will benefit from skilled therapeutic intervention in order to improve the following deficits and impairments:  Abnormal gait, Impaired sensation, Pain, Increased fascial restricitons, Decreased coordination, Decreased mobility, Increased muscle spasms, Decreased range of motion, Decreased endurance, Decreased activity tolerance, Decreased strength, Hypomobility, Difficulty walking  Visit Diagnosis: Pain in left hip  Stiffness of left hip, not elsewhere classified  Muscle weakness (generalized)     Problem List Patient Active Problem List   Diagnosis Date Noted  . Lumbar stenosis with neurogenic claudication 04/12/2015    Blythe Stanford, PT DPT 08/22/2017, 2:47 PM  [facility] [address], [place], [address] Phone: [phone]   Fax:  [phone]  Name: TABITA CORBO MRN: [id number] Date of Birth: 03-03-1950

## 2017-08-27 ENCOUNTER — Ambulatory Visit: Payer: Medicare Other

## 2017-08-27 DIAGNOSIS — M25652 Stiffness of left hip, not elsewhere classified: Secondary | ICD-10-CM

## 2017-08-27 DIAGNOSIS — M25552 Pain in left hip: Secondary | ICD-10-CM

## 2017-08-27 DIAGNOSIS — M6281 Muscle weakness (generalized): Secondary | ICD-10-CM

## 2017-08-27 NOTE — Therapy (Signed)
[facility] [address] [place], [address] Phone: [phone]   Fax:  [phone]  Physical Therapy Treatment  Patient Details  Name: Karen Ramirez MRN: [id number] Date of Birth: 10/09/1950 Referring Provider: Graciella Belton MD   Encounter Date: 08/27/2017  PT End of Session - 08/27/17 1350    Visit Number  2    Number of Visits  12    Date for PT Re-Evaluation  10/03/17    Authorization Type  2/ 10 G Code    PT Start Time  1300    PT Stop Time  4481    PT Time Calculation (min)  45 min    Activity Tolerance  Patient tolerated treatment well    Behavior During Therapy  [facility] for tasks assessed/performed       Past Medical History:  Diagnosis Date  . Arthritis   . Complication of anesthesia   . DDD (degenerative disc disease), lumbar   . GERD (gastroesophageal reflux disease)   . Hypercholesteremia   . Hypothyroidism   . PONV (postoperative nausea and vomiting)   . Wears glasses     Past Surgical History:  Procedure Laterality Date  . ABDOMINAL HYSTERECTOMY  1986  . BACK SURGERY    . BLEPHAROPLASTY     both eyes  . COLONOSCOPY    . ESOPHAGOGASTRODUODENOSCOPY    . LUMBAR LAMINECTOMY  1989  . LUMBAR LAMINECTOMY WITH COFLEX 2 LEVEL N/A 04/12/2015   Procedure: Lumbar two- three,  Lumbar three- four Laminectomy with Coflex;  Surgeon: Kristeen Miss, MD;  Location: [place] [facility];  Service: Neurosurgery;  Laterality: N/A;  L2-3 L3-4 Laminectomy with Coflex  . SHOULDER ARTHROSCOPY WITH BICEPSTENOTOMY Right 05/10/2014   Procedure: SHOULDER ARTHROSCOPY WITH BICEPSTENOTOMY;  Surgeon: Tania Ade, MD;  Location: [place];  Service: Orthopedics;  Laterality: Right;  . SHOULDER ARTHROSCOPY WITH DISTAL CLAVICLE RESECTION Right 05/10/2014   Procedure: SHOULDER ARTHROSCOPY WITH DISTAL CLAVICLE RESECTION DEBRIDEMENT OF ROTATOR CUFF TEAR;  Surgeon: Tania Ade, MD;  Location: [place];  Service: Orthopedics;  Laterality: Right;  . SHOULDER ARTHROSCOPY WITH SUBACROMIAL DECOMPRESSION Right 05/10/2014   Procedure: SHOULDER ARTHROSCOPY WITH SUBACROMIAL DECOMPRESSION;  Surgeon: Tania Ade, MD;  Location: [place];  Service: Orthopedics;  Laterality: Right;  . THUMB ARTHROSCOPY  3/16   left  . VEIN LIGATION     left    There were no vitals filed for this visit.  Subjective Assessment - 08/27/17 1309    Subjective  Patient reports increased pain along the anterior aspect of R upper aspect of her LE. Patinet reports she has continued to have increased pain along the posterior aspect of the L LE.     Pertinent History  Carpal Tunnel surgery, discectomy (lumbar), spacers to improve stenosis, bursa injection    Limitations  Sitting;Standing;Walking    How long can you walk comfortably?  1 hour     Patient Stated Goals  To decrease pain     Currently in Pain?  Yes    Pain Score  3     Pain Location  Hip    Pain Orientation  Left    Pain Descriptors / Indicators  Aching    Pain Onset  More than a month ago    Pain Frequency  Intermittent       TREATMENT: Therapeutic Exercise: Hip extension in sidelying on the R side - x 10 B  Hip extension B  in standing - x 10  Hip abduction in standing B --  x 10  Bridges - x 20 with abduction belt for glute med activation SLR B in hookyling - x 10 B  SLR with dorsiflexion/plantarflexion with therapist support Hip abduction in sidelying on the R side Manual Therapy: STM performed to the glute max B with patient performed to glute max bilaterally with patient in sidelying to decrease spasms  Patient demonstrates no increase in pain at the end of the session    PT Education - 08/27/17 1318    Education Details  form/technique with exercise    Person(s) Educated  Patient    Methods  Explanation;Demonstration    Comprehension  Verbalized understanding;Returned demonstration          PT Long Term  Goals - 08/22/17 1426      PT LONG TERM GOAL #1   Title  Patient will be independent with HEP to continue benefits of therapy until after discharge.     Baseline  Dependent with form/technique    Time  6    Period  Weeks    Status  New    Target Date  10/03/17      PT LONG TERM GOAL #2   Title  Patient will improve FOTO scores from 39 to 55 to indicate significant improvement with lumbar function and pain.     Baseline  FOTO: 39     Time  6    Period  Weeks    Status  New    Target Date  10/03/17      PT LONG TERM GOAL #3   Title  Patient will improve B hip strength along B LE to >4-/5 to indicate improvement with walking and strengthening.    Baseline  3/5-3+/5 for all hip movements    Time  6    Period  Weeks    Status  New    Target Date  10/03/17      PT LONG TERM GOAL #4   Title  Patient will have a worst pain of 1/10 in the hip to allow for improvements with comfort during walkign activities.     Baseline  7/10 worst pain    Time  6    Period  Weeks    Status  New    Target Date  10/03/17            Plan - 08/27/17 1351    Clinical Impression Statement  Patient demonstrates decreased hip strength with exercise and requires therapist support to perform hip exercises in sidelying. Patient demonstrates improvement in exercise technique and strength with cueing to improve muscular activation indicating poor motor control. Patient will benefit from further skilled therapy to return to prior level of function.     Rehab Potential  Fair    Clinical Impairments Affecting Rehab Potential  (+) highly motivated (-) Chronicity of pain    PT Frequency  2x / week    PT Duration  6 weeks    PT Treatment/Interventions  Moist Heat;ADLs/Self Care Home Management;Cryotherapy;Electrical Stimulation;Ultrasound;Fluidtherapy;Iontophoresis 4mg /ml Dexamethasone;Gait training;Stair training;Functional mobility training;Therapeutic activities;Therapeutic exercise;Balance  training;Patient/family education;Neuromuscular re-education;Manual techniques;Dry needling;Passive range of motion    PT Next Visit Plan  Progress motor control, increase hip strength    PT Home Exercise Plan  See education    Consulted and Agree with Plan of Care  Patient       Patient will benefit from skilled therapeutic intervention in order to improve the following  deficits and impairments:  Abnormal gait, Impaired sensation, Pain, Increased fascial restricitons, Decreased coordination, Decreased mobility, Increased muscle spasms, Decreased range of motion, Decreased endurance, Decreased activity tolerance, Decreased strength, Hypomobility, Difficulty walking  Visit Diagnosis: Pain in left hip  Stiffness of left hip, not elsewhere classified  Muscle weakness (generalized)     Problem List Patient Active Problem List   Diagnosis Date Noted  . Lumbar stenosis with neurogenic claudication 04/12/2015    Blythe Stanford, PT DPT 08/27/2017, 1:58 PM  [facility] [address], [place], [address] Phone: [phone]   Fax:  [phone]  Name: Karen Ramirez MRN: [id number] Date of Birth: 1950/03/06

## 2017-08-29 ENCOUNTER — Ambulatory Visit: Payer: Medicare Other

## 2017-09-03 ENCOUNTER — Ambulatory Visit: Payer: Medicare Other | Attending: Rheumatology

## 2017-09-03 DIAGNOSIS — M25552 Pain in left hip: Secondary | ICD-10-CM

## 2017-09-03 DIAGNOSIS — M25652 Stiffness of left hip, not elsewhere classified: Secondary | ICD-10-CM | POA: Diagnosis present

## 2017-09-03 DIAGNOSIS — M6281 Muscle weakness (generalized): Secondary | ICD-10-CM | POA: Diagnosis present

## 2017-09-03 NOTE — Therapy (Signed)
[facility] [address], [place], [address] Phone: [phone]   Fax:  [phone]  Physical Therapy Treatment  Patient Details  Name: Karen Ramirez MRN: [id number] Date of Birth: May 15, 1950 Referring Provider: Graciella Belton MD   Encounter Date: 09/03/2017  PT End of Session - 09/03/17 1012    Visit Number  3    Number of Visits  12    Date for PT Re-Evaluation  10/03/17    Authorization Type  2/ 10 G Code    PT Start Time  0903    PT Stop Time  0946    PT Time Calculation (min)  43 min    Activity Tolerance  Patient tolerated treatment well    Behavior During Therapy  [place] for tasks assessed/performed       Past Medical History:  Diagnosis Date  . Arthritis   . Complication of anesthesia   . DDD (degenerative disc disease), lumbar   . GERD (gastroesophageal reflux disease)   . Hypercholesteremia   . Hypothyroidism   . PONV (postoperative nausea and vomiting)   . Wears glasses     Past Surgical History:  Procedure Laterality Date  . ABDOMINAL HYSTERECTOMY  1986  . BACK SURGERY    . BLEPHAROPLASTY     both eyes  . COLONOSCOPY    . ESOPHAGOGASTRODUODENOSCOPY    . LUMBAR LAMINECTOMY  1989  . LUMBAR LAMINECTOMY WITH COFLEX 2 LEVEL N/A 04/12/2015   Procedure: Lumbar two- three,  Lumbar three- four Laminectomy with Coflex;  Surgeon: Kristeen Miss, MD;  Location: [facility];  Service: Neurosurgery;  Laterality: N/A;  L2-3 L3-4 Laminectomy with Coflex  . SHOULDER ARTHROSCOPY WITH BICEPSTENOTOMY Right 05/10/2014   Procedure: SHOULDER ARTHROSCOPY WITH BICEPSTENOTOMY;  Surgeon: Tania Ade, MD;  Location: [place];  Service: Orthopedics;  Laterality: Right;  . SHOULDER ARTHROSCOPY WITH DISTAL CLAVICLE RESECTION Right 05/10/2014   Procedure: SHOULDER ARTHROSCOPY WITH DISTAL CLAVICLE RESECTION DEBRIDEMENT OF ROTATOR CUFF TEAR;  Surgeon: Tania Ade, MD;  Location: [place];  Service: Orthopedics;  Laterality: Right;  . SHOULDER ARTHROSCOPY WITH SUBACROMIAL DECOMPRESSION Right 05/10/2014   Procedure: SHOULDER ARTHROSCOPY WITH SUBACROMIAL DECOMPRESSION;  Surgeon: Tania Ade, MD;  Location: [place];  Service: Orthopedics;  Laterality: Right;  . THUMB ARTHROSCOPY  3/16   left  . VEIN LIGATION     left    There were no vitals filed for this visit.  Subjective Assessment - 09/03/17 1010    Subjective  Patient reports she continues to have pain and did not notice a noticeable difference after the previous sesssion. Patient reports she has been having difficulty sleeping at night.     Pertinent History  Carpal Tunnel surgery, discectomy (lumbar), spacers to improve stenosis, bursa injection, Biceps detachment on the R side    Limitations  Sitting;Standing;Walking    How long can you walk comfortably?  1 hour     Patient Stated Goals  To decrease pain     Currently in Pain?  Yes    Pain Score  3     Pain Location  Hip    Pain Orientation  Left    Pain Descriptors / Indicators  Aching;Numbness    Pain Onset  More than a month ago    Pain Frequency  Intermittent        TREATMENT: Therapeutic Exercise: Bridges - x 20 with abduction belt for glute med activation Seated SLUMP  in sitting with performing plantarflexion/dorsiflexion - x 20 Standing hip/lumbar extension with arms straight CKC - 3 x 10 Side bending hip/lumbar CKC - x 10 Hooklying marches - x 5 (stopped secondary to increase in pain) LTRs in hookyling - x 20 B Knees to chest with physioball - x 20    Manual Therapy: Light STM performed to the glute max B with patient performed to glute max bilaterally with patient in sidelying to decrease spasms; L5-S1 grade I/II mobs - 3 x 30 secs in prone centrally   Patient demonstrates no increase in pain at the end of the session   PT Education - 09/03/17 1011    Education Details  form/technique with exercise    Person(s)  Educated  Patient    Methods  Explanation;Demonstration    Comprehension  Verbalized understanding;Returned demonstration          PT Long Term Goals - 08/22/17 1426      PT LONG TERM GOAL #1   Title  Patient will be independent with HEP to continue benefits of therapy until after discharge.     Baseline  Dependent with form/technique    Time  6    Period  Weeks    Status  New    Target Date  10/03/17      PT LONG TERM GOAL #2   Title  Patient will improve FOTO scores from 39 to 55 to indicate significant improvement with lumbar function and pain.     Baseline  FOTO: 39     Time  6    Period  Weeks    Status  New    Target Date  10/03/17      PT LONG TERM GOAL #3   Title  Patient will improve B hip strength along B LE to >4-/5 to indicate improvement with walking and strengthening.    Baseline  3/5-3+/5 for all hip movements    Time  6    Period  Weeks    Status  New    Target Date  10/03/17      PT LONG TERM GOAL #4   Title  Patient will have a worst pain of 1/10 in the hip to allow for improvements with comfort during walkign activities.     Baseline  7/10 worst pain    Time  6    Period  Weeks    Status  New    Target Date  10/03/17            Plan - 09/03/17 1017    Clinical Impression Statement  Patient demonstrates decreased pain and spasms after performing prone press ups and standing extension based exercises. Educated patient to perform greater amount of exercises stressing greater lumbar extension to improve sciatic nerve irratation. Patient continues to demonstrate significant hip weakness with exercise requiring external stablization to decrease pain. Patient will benefit from further skilled therapy to return to prior level of function.     Rehab Potential  Fair    Clinical Impairments Affecting Rehab Potential  (+) highly motivated (-) Chronicity of pain    PT Frequency  2x / week    PT Duration  6 weeks    PT Treatment/Interventions  Moist  Heat;ADLs/Self Care Home Management;Cryotherapy;Electrical Stimulation;Ultrasound;Fluidtherapy;Iontophoresis 4mg /ml Dexamethasone;Gait training;Stair training;Functional mobility training;Therapeutic activities;Therapeutic exercise;Balance training;Patient/family education;Neuromuscular re-education;Manual techniques;Dry needling;Passive range of motion    PT Next Visit Plan  Progress motor control, increase hip strength    PT Home Exercise Plan  See education  Consulted and Agree with Plan of Care  Patient       Patient will benefit from skilled therapeutic intervention in order to improve the following deficits and impairments:  Abnormal gait, Impaired sensation, Pain, Increased fascial restricitons, Decreased coordination, Decreased mobility, Increased muscle spasms, Decreased range of motion, Decreased endurance, Decreased activity tolerance, Decreased strength, Hypomobility, Difficulty walking  Visit Diagnosis: Pain in left hip  Stiffness of left hip, not elsewhere classified  Muscle weakness (generalized)     Problem List Patient Active Problem List   Diagnosis Date Noted  . Lumbar stenosis with neurogenic claudication 04/12/2015    Blythe Stanford, PT DPT 09/03/2017, 10:20 AM  [facility] [address], [place], [address] Phone: [phone]   Fax:  [phone]  Name: MICHAELE AMUNDSON MRN: [id number] Date of Birth: 03-09-1950

## 2017-09-05 ENCOUNTER — Ambulatory Visit: Payer: Medicare Other

## 2017-09-09 ENCOUNTER — Ambulatory Visit: Payer: Medicare Other

## 2017-09-11 ENCOUNTER — Ambulatory Visit: Payer: Medicare Other

## 2017-09-16 ENCOUNTER — Ambulatory Visit: Payer: Medicare Other

## 2017-09-19 ENCOUNTER — Ambulatory Visit: Payer: Medicare Other

## 2017-10-22 ENCOUNTER — Other Ambulatory Visit (HOSPITAL_COMMUNITY): Payer: Self-pay | Admitting: Neurological Surgery

## 2017-10-22 ENCOUNTER — Other Ambulatory Visit: Payer: Self-pay | Admitting: Neurological Surgery

## 2017-10-22 DIAGNOSIS — M48062 Spinal stenosis, lumbar region with neurogenic claudication: Secondary | ICD-10-CM

## 2017-10-22 MED ORDER — SODIUM CHLORIDE 0.9 % IV SOLN: 4.0000 mg | Freq: Four times a day (QID) | INTRAVENOUS | Status: DC | PRN

## 2017-11-05 ENCOUNTER — Ambulatory Visit (HOSPITAL_COMMUNITY)
Admission: RE | Admit: 2017-11-05 | Discharge: 2017-11-05 | Disposition: A | Discharge status: Home / Self Care | Payer: Medicare Other | Source: Ambulatory Visit | Attending: Neurological Surgery | Admitting: Neurological Surgery

## 2017-11-05 DIAGNOSIS — M4316 Spondylolisthesis, lumbar region: Secondary | ICD-10-CM | POA: Diagnosis not present

## 2017-11-05 DIAGNOSIS — M48062 Spinal stenosis, lumbar region with neurogenic claudication: Secondary | ICD-10-CM | POA: Insufficient documentation

## 2017-11-05 MED ORDER — IOPAMIDOL (ISOVUE-M 200) INJECTION 41%
INTRAMUSCULAR | Status: AC
  Filled 2017-11-05: qty 10

## 2017-11-05 MED ORDER — KETOROLAC TROMETHAMINE 30 MG/ML IJ SOLN
INTRAMUSCULAR | Status: AC
  Administered 2017-11-05: 15 mg
  Filled 2017-11-05: qty 1

## 2017-11-05 MED ORDER — LIDOCAINE HCL (PF) 1 % IJ SOLN
INTRAMUSCULAR | Status: AC
  Administered 2017-11-05: 5 mL via INTRADERMAL
  Filled 2017-11-05: qty 5

## 2017-11-05 MED ORDER — ONDANSETRON HCL 4 MG/2ML IJ SOLN: 4.0000 mg | Freq: Four times a day (QID) | INTRAMUSCULAR | Status: DC | PRN

## 2017-11-05 MED ORDER — DIAZEPAM 5 MG PO TABS
10.0000 mg | ORAL_TABLET | Freq: Once | ORAL | Status: AC
  Administered 2017-11-05: 10 mg via ORAL
  Filled 2017-11-05: qty 2

## 2017-11-05 MED ORDER — IOPAMIDOL (ISOVUE-M 200) INJECTION 41%
20.0000 mL | Freq: Once | INTRAMUSCULAR | Status: AC
  Administered 2017-11-05: 12 mL via INTRATHECAL

## 2017-11-05 MED ORDER — LIDOCAINE HCL (PF) 1 % IJ SOLN
5.0000 mL | Freq: Once | INTRAMUSCULAR | Status: AC
  Administered 2017-11-05: 5 mL via INTRADERMAL

## 2017-11-05 MED ORDER — KETOROLAC TROMETHAMINE 15 MG/ML IJ SOLN
15.0000 mg | Freq: Once | INTRAMUSCULAR | Status: DC
  Filled 2017-11-05: qty 1

## 2017-11-05 NOTE — Discharge Instructions (Signed)
Myelogram, Care After °These instructions give you information about caring for yourself after your procedure. Your doctor may also give you more specific instructions. Call your doctor if you have any problems or questions after your procedure. °Follow these instructions at home: °· Drink enough fluid to keep your pee (urine) clear or pale yellow. °· Rest as told by your doctor. °· Lie flat with your head slightly raised (elevated). °· Do not bend, lift, or do any hard activities for 24-48 hours or as told by your doctor. °· Take over-the-counter and prescription medicines only as told by your doctor. °· Take care of and remove your bandage (dressing) as told by your doctor. °· Bathe or shower as told by your doctor. °Contact a health care provider if: °· You have a fever. °· You have a headache that lasts longer than 24 hours. °· You feel sick to your stomach (nauseous). °· You throw up (vomit). °· Your neck is stiff. °· Your legs feel numb. °· You cannot pee. °· You cannot poop (have a bowel movement). °· You have a rash. °· You are itchy or sneezing. °Get help right away if: °· You have new symptoms or your symptoms get worse. °· You have a seizure. °· You have trouble breathing. °This information is not intended to replace advice given to you by your health care provider. Make sure you discuss any questions you have with your health care provider. °Document Released: 09/27/2007 Document Revised: 08/18/2015 Document Reviewed: 09/30/2014 °Elsevier Interactive Patient Education © 2018 [employer] ° °

## 2017-11-05 NOTE — Procedures (Addendum)
[name] is a 67 year old individual who has had spondylosis at L2-3 and L3-4.  She has developed significant recurrent pain into her lower extremities and plain x-rays more recently demonstrate a spondylolisthesis at the level of L2-L3 I advised further evaluation with new imaging studies and advised that this would be best done with a myelogram and post myelogram CAT scan.  She is now admitted for that procedure.  Pre op Dx: Lumbar spondylolisthesis and stenosis with radiculopathy Post op Dx: Lumbar spondylolisthesis and stenosis with radiculopathy Procedure: Lumbar myelogram Surgeon: Bonni Neuser Puncture level: L2-3 Fluid color: Colorless Injection: Isovue-200, 12 mL Findings: Moderately severe stenosis at L2-3 and again at L3-4 secondary to spondylolisthesis and disc bulge.  Further evaluation with CT scan

## 2017-12-30 ENCOUNTER — Other Ambulatory Visit: Payer: Self-pay | Admitting: Internal Medicine

## 2017-12-30 DIAGNOSIS — Z1231 Encounter for screening mammogram for malignant neoplasm of breast: Secondary | ICD-10-CM

## 2018-01-23 ENCOUNTER — Ambulatory Visit
Admission: RE | Admit: 2018-01-23 | Discharge: 2018-01-23 | Disposition: A | Discharge status: Home / Self Care | Payer: Medicare Other | Source: Ambulatory Visit | Attending: Internal Medicine | Admitting: Internal Medicine

## 2018-01-23 DIAGNOSIS — Z1231 Encounter for screening mammogram for malignant neoplasm of breast: Secondary | ICD-10-CM | POA: Diagnosis present

## 2018-05-07 ENCOUNTER — Ambulatory Visit
Admission: RE | Admit: 2018-05-07 | Discharge: 2018-05-07 | Disposition: A | Discharge status: Home / Self Care | Payer: Medicare Other | Source: Ambulatory Visit | Attending: Internal Medicine | Admitting: Internal Medicine

## 2018-05-07 ENCOUNTER — Other Ambulatory Visit: Payer: Self-pay

## 2018-05-07 ENCOUNTER — Other Ambulatory Visit: Payer: Self-pay | Admitting: Internal Medicine

## 2018-05-07 DIAGNOSIS — R1031 Right lower quadrant pain: Secondary | ICD-10-CM | POA: Insufficient documentation

## 2018-05-07 LAB — POCT I-STAT CREATININE: Creatinine, Ser: 0.9 mg/dL (ref 0.44–1.00)

## 2018-05-07 MED ORDER — IOHEXOL 300 MG/ML  SOLN
85.0000 mL | Freq: Once | INTRAMUSCULAR | Status: AC | PRN
  Administered 2018-05-07: 85 mL via INTRAVENOUS

## 2018-09-30 ENCOUNTER — Other Ambulatory Visit: Payer: Self-pay | Admitting: Rheumatology

## 2018-09-30 DIAGNOSIS — R9389 Abnormal findings on diagnostic imaging of other specified body structures: Secondary | ICD-10-CM

## 2018-10-09 ENCOUNTER — Ambulatory Visit
Admission: RE | Admit: 2018-10-09 | Discharge: 2018-10-09 | Disposition: A | Discharge status: Home / Self Care | Payer: Medicare Other | Source: Ambulatory Visit | Attending: Rheumatology | Admitting: Rheumatology

## 2018-10-09 ENCOUNTER — Other Ambulatory Visit: Payer: Self-pay

## 2018-10-09 DIAGNOSIS — R9389 Abnormal findings on diagnostic imaging of other specified body structures: Secondary | ICD-10-CM | POA: Insufficient documentation

## 2018-12-15 ENCOUNTER — Other Ambulatory Visit: Payer: Self-pay | Admitting: Internal Medicine

## 2018-12-15 DIAGNOSIS — Z1231 Encounter for screening mammogram for malignant neoplasm of breast: Secondary | ICD-10-CM

## 2019-01-12 ENCOUNTER — Other Ambulatory Visit: Payer: Self-pay | Admitting: Neurological Surgery

## 2019-01-12 DIAGNOSIS — M5416 Radiculopathy, lumbar region: Secondary | ICD-10-CM

## 2019-01-18 ENCOUNTER — Ambulatory Visit
Admission: RE | Admit: 2019-01-18 | Discharge: 2019-01-18 | Disposition: A | Discharge status: Home / Self Care | Payer: Medicare Other | Source: Ambulatory Visit | Attending: Neurological Surgery | Admitting: Neurological Surgery

## 2019-01-18 ENCOUNTER — Other Ambulatory Visit: Payer: Self-pay

## 2019-01-18 DIAGNOSIS — M5416 Radiculopathy, lumbar region: Secondary | ICD-10-CM

## 2019-01-18 LAB — POCT I-STAT CREATININE: Creatinine, Ser: 0.9 mg/dL (ref 0.44–1.00)

## 2019-01-18 MED ORDER — GADOBUTROL 1 MMOL/ML IV SOLN
7.5000 mL | Freq: Once | INTRAVENOUS | Status: AC | PRN
  Administered 2019-01-18: 7.5 mL via INTRAVENOUS

## 2019-02-20 ENCOUNTER — Ambulatory Visit
Admission: RE | Admit: 2019-02-20 | Discharge: 2019-02-20 | Disposition: A | Discharge status: Home / Self Care | Payer: Medicare Other | Source: Ambulatory Visit | Attending: Internal Medicine | Admitting: Internal Medicine

## 2019-02-20 DIAGNOSIS — Z1231 Encounter for screening mammogram for malignant neoplasm of breast: Secondary | ICD-10-CM | POA: Insufficient documentation

## 2019-03-24 ENCOUNTER — Other Ambulatory Visit: Payer: Self-pay | Admitting: Family Medicine

## 2019-03-24 DIAGNOSIS — R221 Localized swelling, mass and lump, neck: Secondary | ICD-10-CM

## 2019-04-02 ENCOUNTER — Ambulatory Visit: Admission: RE | Admit: 2019-04-02 | Payer: Medicare Other | Source: Ambulatory Visit

## 2019-05-25 ENCOUNTER — Other Ambulatory Visit: Payer: Self-pay

## 2019-05-25 ENCOUNTER — Ambulatory Visit (INDEPENDENT_AMBULATORY_CARE_PROVIDER_SITE_OTHER): Payer: Medicare Other | Admitting: Dermatology

## 2019-05-25 DIAGNOSIS — L308 Other specified dermatitis: Secondary | ICD-10-CM | POA: Diagnosis not present

## 2019-05-25 MED ORDER — MOMETASONE FUROATE 0.1 % EX CREA: TOPICAL_CREAM | CUTANEOUS | 1 refills | Status: DC

## 2019-05-25 NOTE — Patient Instructions (Signed)
Topical steroids (such as triamcinolone, fluocinolone, fluocinonide, mometasone, clobetasol, halobetasol, betamethasone, hydrocortisone) can cause thinning and lightening of the skin if they are used for too long in the same area. Your physician has selected the right strength medicine for your problem and area affected on the body. Please use your medication only as directed by your physician to prevent side effects.   . 

## 2019-05-25 NOTE — Progress Notes (Signed)
   Follow-Up Visit   Subjective  Karen Ramirez is a 69 y.o. female who presents for the following: Rash (neck x 3 wks, was red and raised and itchy but improving, used vanicream, bx 09/23/18 showed contact derm ). Had similar rash in September 2020, biopsy proven Contact or Nummular Dermatitis. DIF negative. No change in detergent. No perfumes or lotions. No new shampoo/conditioner. Patient seeing Dr Precious Reel for possible RA. She will be starting MTX.   The following portions of the chart were reviewed this encounter and updated as appropriate:      Review of Systems:  No other skin or systemic complaints except as noted in HPI or Assessment and Plan.  Objective  Well appearing patient in no apparent distress; mood and affect are within normal limits.  A focused examination was performed including chest. Relevant physical exam findings are noted in the Assessment and Plan.  Objective  Chest: Erythema on upper chest, photodistribution.   Assessment & Plan  Other eczema Chest  Contact Derm, unclear etiology vs PMLE  Start mometasone cream Apply to rash BID prn flares/itch dsp 45g 1Rf. Topical steroids (such as triamcinolone, fluocinolone, fluocinonide, mometasone, clobetasol, halobetasol, betamethasone, hydrocortisone) can cause thinning and lightening of the skin if they are used for too long in the same area. Your physician has selected the right strength medicine for your problem and area affected on the body. Please use your medication only as directed by your physician to prevent side effects.   Recommend daily broad spectrum sunscreen SPF 30+ to sun-exposed areas, reapply every 2 hours as needed. Aveeno for sensitive skin spf 50 Positively Mineral Sunscreen - sample given.  Samples of VaniCream, Ointment, HC Cream. Pamphlet given.  Discussed Patch Testing if not improving or recurrent problem  mometasone (ELOCON) 0.1 % cream - Chest  Return if symptoms worsen  or fail to improve.  IJamesetta Orleans, CMA, am acting as scribe for Brendolyn Patty, MD .  Documentation: I have reviewed the above documentation for accuracy and completeness, and I agree with the above.  Brendolyn Patty MD

## 2019-06-03 ENCOUNTER — Other Ambulatory Visit: Payer: Self-pay | Admitting: Internal Medicine

## 2019-06-03 DIAGNOSIS — R600 Localized edema: Secondary | ICD-10-CM

## 2019-06-03 DIAGNOSIS — R1084 Generalized abdominal pain: Secondary | ICD-10-CM

## 2019-06-12 ENCOUNTER — Other Ambulatory Visit: Payer: Self-pay

## 2019-06-12 ENCOUNTER — Ambulatory Visit
Admission: RE | Admit: 2019-06-12 | Discharge: 2019-06-12 | Disposition: A | Discharge status: Home / Self Care | Payer: Medicare Other | Source: Ambulatory Visit | Attending: Internal Medicine | Admitting: Internal Medicine

## 2019-06-12 DIAGNOSIS — R600 Localized edema: Secondary | ICD-10-CM | POA: Diagnosis present

## 2019-06-12 DIAGNOSIS — R1084 Generalized abdominal pain: Secondary | ICD-10-CM | POA: Diagnosis present

## 2019-06-12 MED ORDER — IOHEXOL 300 MG/ML  SOLN
100.0000 mL | Freq: Once | INTRAMUSCULAR | Status: AC | PRN
  Administered 2019-06-12: 100 mL via INTRAVENOUS

## 2019-06-18 ENCOUNTER — Other Ambulatory Visit: Payer: Self-pay | Admitting: Otolaryngology

## 2019-06-18 DIAGNOSIS — IMO0001 Reserved for inherently not codable concepts without codable children: Secondary | ICD-10-CM

## 2019-07-07 ENCOUNTER — Ambulatory Visit: Payer: Medicare Other | Attending: Rheumatology | Admitting: Occupational Therapy

## 2019-07-07 ENCOUNTER — Encounter: Payer: Self-pay | Admitting: Occupational Therapy

## 2019-07-07 ENCOUNTER — Other Ambulatory Visit: Payer: Self-pay

## 2019-07-07 DIAGNOSIS — M25652 Stiffness of left hip, not elsewhere classified: Secondary | ICD-10-CM | POA: Diagnosis present

## 2019-07-07 DIAGNOSIS — M79642 Pain in left hand: Secondary | ICD-10-CM

## 2019-07-07 DIAGNOSIS — R600 Localized edema: Secondary | ICD-10-CM

## 2019-07-07 DIAGNOSIS — M6281 Muscle weakness (generalized): Secondary | ICD-10-CM | POA: Diagnosis present

## 2019-07-07 DIAGNOSIS — M25552 Pain in left hip: Secondary | ICD-10-CM | POA: Diagnosis present

## 2019-07-07 NOTE — Therapy (Signed)
[facility] [address] [place], [address] Phone: [phone]   Fax:  [phone]  Occupational Therapy Treatment  Patient Details  Name: Karen Ramirez MRN: [id number] Date of Birth: [birth year] Referring Provider (OT): Jefm Bryant   Encounter Date: 07/07/2019   OT End of Session - 07/07/19 1355    Visit Number 1    Number of Visits 8    Date for OT Re-Evaluation 08/04/19    OT Start Time 1252    OT Stop Time 1339    OT Time Calculation (min) 47 min    Activity Tolerance Patient tolerated treatment well    Behavior During Therapy WFL for tasks assessed/performed           Past Medical History:  Diagnosis Date   Arthritis    Complication of anesthesia    DDD (degenerative disc disease), lumbar    Dermatitis    GERD (gastroesophageal reflux disease)    Hypercholesteremia    Hypothyroidism    PONV (postoperative nausea and vomiting)    Wears glasses     Past Surgical History:  Procedure Laterality Date   ABDOMINAL HYSTERECTOMY  1986   BACK SURGERY     BLEPHAROPLASTY     both eyes   COLONOSCOPY     ESOPHAGOGASTRODUODENOSCOPY     LUMBAR LAMINECTOMY  1989   LUMBAR LAMINECTOMY WITH COFLEX 2 LEVEL N/A 04/12/2015   Procedure: Lumbar two- three,  Lumbar three- four Laminectomy with Coflex;  Surgeon: Kristeen Miss, MD;  Location: [facility];  Service: Neurosurgery;  Laterality: N/A;  L2-3 L3-4 Laminectomy with Coflex   SHOULDER ARTHROSCOPY WITH BICEPSTENOTOMY Right 05/10/2014   Procedure: SHOULDER ARTHROSCOPY WITH BICEPSTENOTOMY;  Surgeon: Tania Ade, MD;  Location: [facility];  Service: Orthopedics;  Laterality: Right;   SHOULDER ARTHROSCOPY WITH DISTAL CLAVICLE RESECTION Right 05/10/2014   Procedure: SHOULDER ARTHROSCOPY WITH DISTAL CLAVICLE RESECTION DEBRIDEMENT OF ROTATOR CUFF TEAR;  Surgeon: Tania Ade, MD;  Location: [facility];  Service:  Orthopedics;  Laterality: Right;   SHOULDER ARTHROSCOPY WITH SUBACROMIAL DECOMPRESSION Right 05/10/2014   Procedure: SHOULDER ARTHROSCOPY WITH SUBACROMIAL DECOMPRESSION;  Surgeon: Tania Ade, MD;  Location: [facility];  Service: Orthopedics;  Laterality: Right;   THUMB ARTHROSCOPY  3/16   left   VEIN LIGATION     left    There were no vitals filed for this visit.   Subjective Assessment - 07/07/19 1348    Subjective  I seen you in the past - my R hand doing much better after the trigger finger and CTS - but my L hand has been bothering me , pain and swelling- we are trying to get me off prednisone - but I have been on methotrexate now for about month    Pertinent History Pt was seen in the past for R hand pain - had CTS and trigger finger release on R about 2019 - now refer by Rheumathogy for L hand pain , swelling -and since month ago on methotrexate  - trying to get her off prednisone-    Patient Stated Goals I want the pain and swelling better in my L hand so I can get more strength and maintain my motion    Currently in Pain? Yes    Pain Score 5     Pain Location Hand   tender over 3rd A1pulley - 8/10   Pain Orientation Left    Pain Descriptors / Indicators Aching  Pain Type Chronic pain    Pain Onset More than a month ago    Pain Frequency Intermittent              [facility] OT Assessment - 07/07/19 0001      Assessment   Medical Diagnosis L hand pain     Referring Provider (OT) Jefm Bryant    Onset Date/Surgical Date 05/02/19    Hand Dominance Right    Prior Therapy 2017 R hand pain and numbness      Prior Function   Vocation Retired    Leisure reading , house work, Estate agent , Oceanographer, grand children in [place]   Right Hand Grip (lbs) 35    Right Hand Lateral Pinch 13 lbs    Right Hand 3 Point Pinch 12 lbs    Left Hand Grip (lbs) 19    Left Hand Lateral Pinch 10 lbs    Left Hand 3 Point Pinch 6 lbs      Left Hand AROM   L Thumb  Opposition to Index --   touching 5th - pull volar thumb   L Index  MCP 0-90 80 Degrees    L Index PIP 0-100 95 Degrees    L Long  MCP 0-90 85 Degrees    L Long PIP 0-100 95 Degrees    L Ring  MCP 0-90 85 Degrees    L Ring PIP 0-100 95 Degrees    L Little  MCP 0-90 85 Degrees    L Little PIP 0-100 95 Degrees             contrast done with pt to decrease edema and pain -and fit pt with isotoner glove - small    Contrast= 3 x day  isotoner glove most all the time - to wear   Pain free tendon glides - 8 reps each -stop when feeling pull  Joint protection principles review and hand out provided                OT Education - 07/07/19 1355    Education Details findings of eval and HEP    Person(s) Educated Patient    Methods Explanation;Demonstration;Tactile cues;Verbal cues;Handout    Comprehension Verbal cues required;Returned demonstration;Verbalized understanding               OT Long Term Goals - 07/07/19 1358      OT LONG TERM GOAL #1   Title Pt to be independent in HEP to decrease pain and edema to 2/10 at the worse with functional use    Baseline pain 3-5/10 L hand -and tenderness over A1pulley of 3rd 8/10    Time 3    Period Weeks    Status New    Target Date 07/28/19      OT LONG TERM GOAL #2   Title L grip strength increase with more than 5 lbs to be able to hold or grip object with more ease    Baseline R grip 35 lbs, L 19 lbs - 3 point grip R 12 lbs ,L 6 lbs    Time 4    Period Weeks    Status New    Target Date 08/04/19                 Plan - 07/07/19 1356    Clinical Impression Statement Pt present at OT eval with increase L hand pain and edema -over dorsal/volar 2nd and 3rd MC's -  pain 3-5/10 with use and tenderness over A1pulley of 3rd 8/10 - pt show decrease composite flexion end range and decrease grip and prehension strength limiting her functional use of L hand in ADL's and IADl's    OT Occupational Profile and History  Problem Focused Assessment - Including review of records relating to presenting problem    Occupational performance deficits (Please refer to evaluation for details): ADL's;IADL's;Play;Leisure    Body Structure / Function / Physical Skills ADL;Decreased knowledge of use of DME;Flexibility;ROM;UE functional use;Edema;Pain;Strength;IADL    Rehab Potential Good    Clinical Decision Making Limited treatment options, no task modification necessary    Comorbidities Affecting Occupational Performance: May have comorbidities impacting occupational performance   inflammatory arthritis   Modification or Assistance to Complete Evaluation  No modification of tasks or assist necessary to complete eval    OT Frequency 2x / week    OT Duration 4 weeks    OT Treatment/Interventions Self-care/ADL training;Iontophoresis;Contrast Bath;Ultrasound;Manual Therapy;Patient/family education;Splinting;Therapeutic exercise    Plan assess progress with HEP    OT Home Exercise Plan see pt instruction    Consulted and Agree with Plan of Care Patient           Patient will benefit from skilled therapeutic intervention in order to improve the following deficits and impairments:   Body Structure / Function / Physical Skills: ADL, Decreased knowledge of use of DME, Flexibility, ROM, UE functional use, Edema, Pain, Strength, IADL       Visit Diagnosis: Pain in left hand - Plan: Ot plan of care cert/re-cert  Localized edema - Plan: Ot plan of care cert/re-cert  Muscle weakness (generalized) - Plan: Ot plan of care cert/re-cert    Problem List Patient Active Problem List   Diagnosis Date Noted   Lumbar stenosis with neurogenic claudication 04/12/2015    Rosalyn Gess OTR/L,CLT  07/07/2019, 2:03 PM  [facility] [address] [place], [address] Phone: [phone]   Fax:  [phone]  Name: CONNER NEISS MRN: [id number] Date of  Birth: 1950-05-17

## 2019-07-07 NOTE — Patient Instructions (Signed)
Contrast= 3 x day  isotoner glove most all the time - to wear   Pain free tendon glides - 8 reps each -stop when feeling pull  Joint protection principles review and hand out provided

## 2019-07-09 ENCOUNTER — Other Ambulatory Visit: Payer: Self-pay

## 2019-07-09 ENCOUNTER — Ambulatory Visit
Admission: RE | Admit: 2019-07-09 | Discharge: 2019-07-09 | Disposition: A | Discharge status: Home / Self Care | Payer: Medicare Other | Source: Ambulatory Visit | Attending: Otolaryngology | Admitting: Otolaryngology

## 2019-07-09 ENCOUNTER — Ambulatory Visit: Payer: Medicare Other | Admitting: Occupational Therapy

## 2019-07-09 DIAGNOSIS — M79642 Pain in left hand: Secondary | ICD-10-CM | POA: Diagnosis not present

## 2019-07-09 DIAGNOSIS — M6281 Muscle weakness (generalized): Secondary | ICD-10-CM

## 2019-07-09 DIAGNOSIS — H9041 Sensorineural hearing loss, unilateral, right ear, with unrestricted hearing on the contralateral side: Secondary | ICD-10-CM | POA: Diagnosis not present

## 2019-07-09 DIAGNOSIS — IMO0001 Reserved for inherently not codable concepts without codable children: Secondary | ICD-10-CM

## 2019-07-09 DIAGNOSIS — R600 Localized edema: Secondary | ICD-10-CM

## 2019-07-09 MED ORDER — GADOBUTROL 1 MMOL/ML IV SOLN
7.5000 mL | Freq: Once | INTRAVENOUS | Status: AC | PRN
  Administered 2019-07-09: 7.5 mL via INTRAVENOUS

## 2019-07-09 NOTE — Therapy (Signed)
[facility] [address] [place], [address] Phone: [phone]   Fax:  [phone]  Occupational Therapy Treatment  Patient Details  Name: Karen Ramirez MRN: [id number] Date of Birth: 1950-08-21 Referring Provider (OT): Jefm Bryant   Encounter Date: 07/09/2019   OT End of Session - 07/09/19 1513    Visit Number 2    Number of Visits 8    Date for OT Re-Evaluation 08/04/19    OT Start Time 1501    OT Stop Time 1548    OT Time Calculation (min) 47 min    Activity Tolerance Patient tolerated treatment well    Behavior During Therapy [facility] for tasks assessed/performed           Past Medical History:  Diagnosis Date  . Arthritis   . Complication of anesthesia   . DDD (degenerative disc disease), lumbar   . Dermatitis   . GERD (gastroesophageal reflux disease)   . Hypercholesteremia   . Hypothyroidism   . PONV (postoperative nausea and vomiting)   . Wears glasses     Past Surgical History:  Procedure Laterality Date  . ABDOMINAL HYSTERECTOMY  1986  . BACK SURGERY    . BLEPHAROPLASTY     both eyes  . COLONOSCOPY    . ESOPHAGOGASTRODUODENOSCOPY    . LUMBAR LAMINECTOMY  1989  . LUMBAR LAMINECTOMY WITH COFLEX 2 LEVEL N/A 04/12/2015   Procedure: Lumbar two- three,  Lumbar three- four Laminectomy with Coflex;  Surgeon: Kristeen Miss, MD;  Location: [facility];  Service: Neurosurgery;  Laterality: N/A;  L2-3 L3-4 Laminectomy with Coflex  . SHOULDER ARTHROSCOPY WITH BICEPSTENOTOMY Right 05/10/2014   Procedure: SHOULDER ARTHROSCOPY WITH BICEPSTENOTOMY;  Surgeon: Tania Ade, MD;  Location: [facility];  Service: Orthopedics;  Laterality: Right;  . SHOULDER ARTHROSCOPY WITH DISTAL CLAVICLE RESECTION Right 05/10/2014   Procedure: SHOULDER ARTHROSCOPY WITH DISTAL CLAVICLE RESECTION DEBRIDEMENT OF ROTATOR CUFF TEAR;  Surgeon: Tania Ade, MD;  Location: [facility];  Service:  Orthopedics;  Laterality: Right;  . SHOULDER ARTHROSCOPY WITH SUBACROMIAL DECOMPRESSION Right 05/10/2014   Procedure: SHOULDER ARTHROSCOPY WITH SUBACROMIAL DECOMPRESSION;  Surgeon: Tania Ade, MD;  Location: [facility];  Service: Orthopedics;  Laterality: Right;  . THUMB ARTHROSCOPY  3/16   left  . VEIN LIGATION     left    There were no vitals filed for this visit.   Subjective Assessment - 07/09/19 1509    Subjective  My hand about the same - did the hot and cold, did wear the glove    Pertinent History Pt was seen in the past for R hand pain - had CTS and trigger finger release on R about 2019 - now refer by Rheumathogy for L hand pain , swelling -and since month ago on methotrexate  - trying to get her off prednisone-    Patient Stated Goals I want the pain and swelling better in my L hand so I can get more strength and maintain my motion    Currently in Pain? Yes    Pain Score 2     Pain Location Hand    Pain Orientation Left    Pain Descriptors / Indicators Aching    Pain Type Chronic pain    Pain Onset More than a month ago              [facility] OT Assessment - 07/09/19 0001      Left Hand  AROM   L Index  MCP 0-90 85 Degrees    L Index PIP 0-100 95 Degrees    L Long  MCP 0-90 90 Degrees    L Long PIP 0-100 95 Degrees    L Ring  MCP 0-90 90 Degrees    L Ring PIP 0-100 95 Degrees    L Little  MCP 0-90 90 Degrees    L Little PIP 0-100 95 Degrees            MC flexion did increase - cont to have edema over MC's and volar area Increase MC flexion       Skin check done prior - no issues - removed and not issues   OT Treatments/Exercises (OP) - 07/09/19 0001      Iontophoresis   Type of Iontophoresis Dexamethasone    Location 3rd A1pulley on L     Dose small patch , 2.0 current     Time 19      LUE Contrast Bath   Time 8 minutes    Comments decrease pain , prior to soft tissue          contrast done prior to soft tissue  fit pt with  isotoner glove last time- small    Contrast= 3 x day cont at home    Pain free tendon glides - 8 reps each -stop when feeling pull  Joint protection principles review and hand out provided last time           OT Education - 07/09/19 1513    Education Details review HEP and ed on ionto    Person(s) Educated Patient    Methods Explanation;Demonstration;Tactile cues;Verbal cues;Handout    Comprehension Verbal cues required;Returned demonstration;Verbalized understanding               OT Long Term Goals - 07/07/19 1358      OT LONG TERM GOAL #1   Title Pt to be independent in HEP to decrease pain and edema to 2/10 at the worse with functional use    Baseline pain 3-5/10 L hand -and tenderness over A1pulley of 3rd 8/10    Time 3    Period Weeks    Status New    Target Date 07/28/19      OT LONG TERM GOAL #2   Title L grip strength increase with more than 5 lbs to be able to hold or grip object with more ease    Baseline R grip 35 lbs, L 19 lbs - 3 point grip R 12 lbs ,L 6 lbs    Time 4    Period Weeks    Status New    Target Date 08/04/19                 Plan - 07/09/19 1514    Clinical Impression Statement Pt cont to have increase edema in L hand - but MC flexion increase since ealier this year - wearing isotoner glove , contrast and pain free tendon glides - ionto for first session on A1pulley of 3rd digit    OT Occupational Profile and History Problem Focused Assessment - Including review of records relating to presenting problem    Occupational performance deficits (Please refer to evaluation for details): ADL's;IADL's;Play;Leisure    Body Structure / Function / Physical Skills ADL;Decreased knowledge of use of DME;Flexibility;ROM;UE functional use;Edema;Pain;Strength;IADL    Rehab Potential Good    Clinical Decision Making Limited treatment options, no task modification necessary  Comorbidities Affecting Occupational Performance: May have  comorbidities impacting occupational performance    Modification or Assistance to Complete Evaluation  No modification of tasks or assist necessary to complete eval    OT Frequency 2x / week    OT Duration 4 weeks    OT Treatment/Interventions Self-care/ADL training;Iontophoresis;Contrast Bath;Ultrasound;Manual Therapy;Patient/family education;Splinting;Therapeutic exercise    Plan assess progress with HEP    OT Home Exercise Plan see pt instruction    Consulted and Agree with Plan of Care Patient           Patient will benefit from skilled therapeutic intervention in order to improve the following deficits and impairments:   Body Structure / Function / Physical Skills: ADL, Decreased knowledge of use of DME, Flexibility, ROM, UE functional use, Edema, Pain, Strength, IADL       Visit Diagnosis: Pain in left hand  Localized edema  Muscle weakness (generalized)    Problem List Patient Active Problem List   Diagnosis Date Noted  . Lumbar stenosis with neurogenic claudication 04/12/2015    Rosalyn Gess  OTR/l,CLT  07/09/2019, 5:54 PM  [facility] [address] [place], [address] Phone: [phone]   Fax:  [phone]  Name: Karen Ramirez MRN: [id number] Date of Birth: November 11, 1950

## 2019-07-14 ENCOUNTER — Encounter: Payer: Self-pay | Admitting: Occupational Therapy

## 2019-07-14 ENCOUNTER — Ambulatory Visit: Payer: Medicare Other | Admitting: Occupational Therapy

## 2019-07-14 ENCOUNTER — Other Ambulatory Visit: Payer: Self-pay

## 2019-07-14 DIAGNOSIS — M79642 Pain in left hand: Secondary | ICD-10-CM

## 2019-07-14 DIAGNOSIS — M25652 Stiffness of left hip, not elsewhere classified: Secondary | ICD-10-CM

## 2019-07-14 DIAGNOSIS — R600 Localized edema: Secondary | ICD-10-CM

## 2019-07-14 DIAGNOSIS — M25552 Pain in left hip: Secondary | ICD-10-CM

## 2019-07-14 DIAGNOSIS — M6281 Muscle weakness (generalized): Secondary | ICD-10-CM

## 2019-07-14 NOTE — Therapy (Signed)
[facility] [address], [place], [address] Phone: [phone]   Fax:  [phone]  Occupational Therapy Treatment  Patient Details  Name: Karen Ramirez MRN: [id number] Date of Birth: 05/22/1950 Referring Provider (OT): Jefm Bryant   Encounter Date: 07/14/2019   OT End of Session - 07/16/19 1031    Visit Number 3    Number of Visits 8    Date for OT Re-Evaluation 08/04/19    OT Start Time 1350    OT Stop Time 1444    OT Time Calculation (min) 54 min    Activity Tolerance Patient tolerated treatment well    Behavior During Therapy [facility] for tasks assessed/performed           Past Medical History:  Diagnosis Date  . Arthritis   . Complication of anesthesia   . DDD (degenerative disc disease), lumbar   . Dermatitis   . GERD (gastroesophageal reflux disease)   . Hypercholesteremia   . Hypothyroidism   . PONV (postoperative nausea and vomiting)   . Wears glasses     Past Surgical History:  Procedure Laterality Date  . ABDOMINAL HYSTERECTOMY  1986  . BACK SURGERY    . BLEPHAROPLASTY     both eyes  . COLONOSCOPY    . ESOPHAGOGASTRODUODENOSCOPY    . LUMBAR LAMINECTOMY  1989  . LUMBAR LAMINECTOMY WITH COFLEX 2 LEVEL N/A 04/12/2015   Procedure: Lumbar two- three,  Lumbar three- four Laminectomy with Coflex;  Surgeon: Kristeen Miss, MD;  Location: [facility];  Service: Neurosurgery;  Laterality: N/A;  L2-3 L3-4 Laminectomy with Coflex  . SHOULDER ARTHROSCOPY WITH BICEPSTENOTOMY Right 05/10/2014   Procedure: SHOULDER ARTHROSCOPY WITH BICEPSTENOTOMY;  Surgeon: Tania Ade, MD;  Location: [facility];  Service: Orthopedics;  Laterality: Right;  . SHOULDER ARTHROSCOPY WITH DISTAL CLAVICLE RESECTION Right 05/10/2014   Procedure: SHOULDER ARTHROSCOPY WITH DISTAL CLAVICLE RESECTION DEBRIDEMENT OF ROTATOR CUFF TEAR;  Surgeon: Tania Ade, MD;  Location: [facility];  Service:  Orthopedics;  Laterality: Right;  . SHOULDER ARTHROSCOPY WITH SUBACROMIAL DECOMPRESSION Right 05/10/2014   Procedure: SHOULDER ARTHROSCOPY WITH SUBACROMIAL DECOMPRESSION;  Surgeon: Tania Ade, MD;  Location: [facility];  Service: Orthopedics;  Laterality: Right;  . THUMB ARTHROSCOPY  3/16   left  . VEIN LIGATION     left    There were no vitals filed for this visit.   Subjective Assessment - 07/17/19 1029    Subjective  Patient reports she is having a hard time sleeping, feels like the compression glove is tight at night waking her up multiple times.    Pertinent History Pt was seen in the past for R hand pain - had CTS and trigger finger release on R about 2019 - now refer by Rheumathogy for L hand pain , swelling -and since month ago on methotrexate  - trying to get her off prednisone-    Patient Stated Goals I want the pain and swelling better in my L hand so I can get more strength and maintain my motion    Pain Onset More than a month ago            Patient reports pain at base of thumb at night, keeps her awake at times and appears worse when wearing compression glove at night.  Assessed pain and issued black soft neoprene thumb spica to wear at night for support and switch to wearing compression glove to daytime for  shorter periods of time for edema control.  Patient instructed on donning and doffing.  Patient demonstrates understanding of use.    Patient seen for use of contrast for edema control for 11 mins with alternating warm/cold.    Following contrast, patient seen for soft tissue mobs/tech by therapist to increase range of motion, decrease pain and decrease edema.   Tendon gliding exercises for 10 reps with min cues for technique.    Iontophoresis to  3rd A1 pulley on left with use of small patch, initially 2.0 current but patient complained of difficulty tolerating current, decreased down to 1.4 for 28 mins.    Continue with edema control measures  at home with use of contrast, implement changes above with new thumb spica for pain and isotoner during the day.            OT Treatments/Exercises (OP) - 07/17/19 1048      Iontophoresis   Type of Iontophoresis Dexamethasone    Location 3rd A1pulley on L     Dose small patch , 2.0 current then 1.4    Time 19      LUE Contrast Bath   Time 11 minutes    Comments decrease pain , prior to soft tissue                   OT Education - 07/17/19 1025    Education Details review HEP, use of thumb spica soft neoprene for night, compression in day    Person(s) Educated Patient    Methods Explanation;Demonstration;Tactile cues;Verbal cues;Handout    Comprehension Verbal cues required;Returned demonstration;Verbalized understanding               OT Long Term Goals - 07/07/19 1358      OT LONG TERM GOAL #1   Title Pt to be independent in HEP to decrease pain and edema to 2/10 at the worse with functional use    Baseline pain 3-5/10 L hand -and tenderness over A1pulley of 3rd 8/10    Time 3    Period Weeks    Status New    Target Date 07/28/19      OT LONG TERM GOAL #2   Title L grip strength increase with more than 5 lbs to be able to hold or grip object with more ease    Baseline R grip 35 lbs, L 19 lbs - 3 point grip R 12 lbs ,L 6 lbs    Time 4    Period Weeks    Status New    Target Date 08/04/19                 Plan - 07/16/19 1027    Clinical Impression Statement Patient with pain at base of thumb and difficulty sleeping the last few nights.  Continues to have edema in the hand and has been performing contrast at home.  She has decreased toleration of compression glove at night which may be placing pressure at base of thumb.  Switched to soft neoprene thumb spica at night and compression glove during the day.  Will monitor pain and adjust as needed next session.  Continues with pain at A1 pulley but starting to lessen and will continue with ionto.     OT Occupational Profile and History Problem Focused Assessment - Including review of records relating to presenting problem    Occupational performance deficits (Please refer to evaluation for details): ADL's;IADL's;Play;Leisure    Body Structure / Function / Physical Skills  ADL;Decreased knowledge of use of DME;Flexibility;ROM;UE functional use;Edema;Pain;Strength;IADL    Rehab Potential Good    Clinical Decision Making Limited treatment options, no task modification necessary    Comorbidities Affecting Occupational Performance: May have comorbidities impacting occupational performance    Modification or Assistance to Complete Evaluation  No modification of tasks or assist necessary to complete eval    OT Frequency 2x / week    OT Duration 4 weeks    OT Treatment/Interventions Self-care/ADL training;Iontophoresis;Contrast Bath;Ultrasound;Manual Therapy;Patient/family education;Splinting;Therapeutic exercise    Plan assess progress with HEP    OT Home Exercise Plan see pt instruction    Consulted and Agree with Plan of Care Patient           Patient will benefit from skilled therapeutic intervention in order to improve the following deficits and impairments:   Body Structure / Function / Physical Skills: ADL, Decreased knowledge of use of DME, Flexibility, ROM, UE functional use, Edema, Pain, Strength, IADL       Visit Diagnosis: Pain in left hand  Localized edema  Muscle weakness (generalized)  Pain in left hip  Stiffness of left hip, not elsewhere classified    Problem List Patient Active Problem List   Diagnosis Date Noted  . Lumbar stenosis with neurogenic claudication 04/12/2015   Manolo Bosket T Antwane Grose, OTR/L, CLT  Kaylinn Dedic 07/17/2019, 10:49 AM  [facility] [address] [place], [address] Phone: [phone]   Fax:  [phone]  Name: Karen Ramirez MRN: [id number] Date of Birth: June 28, 1950

## 2019-07-16 ENCOUNTER — Other Ambulatory Visit: Payer: Self-pay

## 2019-07-16 ENCOUNTER — Ambulatory Visit: Payer: Medicare Other | Admitting: Occupational Therapy

## 2019-07-16 DIAGNOSIS — M6281 Muscle weakness (generalized): Secondary | ICD-10-CM

## 2019-07-16 DIAGNOSIS — R600 Localized edema: Secondary | ICD-10-CM

## 2019-07-16 DIAGNOSIS — M79642 Pain in left hand: Secondary | ICD-10-CM | POA: Diagnosis not present

## 2019-07-17 ENCOUNTER — Encounter: Payer: Self-pay | Admitting: Occupational Therapy

## 2019-07-17 NOTE — Therapy (Signed)
[facility] [address], [place], [address] Phone: [phone]   Fax:  [phone]  Occupational Therapy Treatment  Patient Details  Name: Karen Ramirez MRN: [id number] Date of Birth: 1950-06-23 Referring Provider (OT): Jefm Bryant   Encounter Date: 07/16/2019   OT End of Session - 07/17/19 1054    Visit Number 4    Number of Visits 8    Date for OT Re-Evaluation 08/04/19    OT Start Time 1346    OT Stop Time 1445    OT Time Calculation (min) 59 min    Activity Tolerance Patient tolerated treatment well    Behavior During Therapy [facility] for tasks assessed/performed           Past Medical History:  Diagnosis Date  . Arthritis   . Complication of anesthesia   . DDD (degenerative disc disease), lumbar   . Dermatitis   . GERD (gastroesophageal reflux disease)   . Hypercholesteremia   . Hypothyroidism   . PONV (postoperative nausea and vomiting)   . Wears glasses     Past Surgical History:  Procedure Laterality Date  . ABDOMINAL HYSTERECTOMY  1986  . BACK SURGERY    . BLEPHAROPLASTY     both eyes  . COLONOSCOPY    . ESOPHAGOGASTRODUODENOSCOPY    . LUMBAR LAMINECTOMY  1989  . LUMBAR LAMINECTOMY WITH COFLEX 2 LEVEL N/A 04/12/2015   Procedure: Lumbar two- three,  Lumbar three- four Laminectomy with Coflex;  Surgeon: Kristeen Miss, MD;  Location: [place] [facility];  Service: Neurosurgery;  Laterality: N/A;  L2-3 L3-4 Laminectomy with Coflex  . SHOULDER ARTHROSCOPY WITH BICEPSTENOTOMY Right 05/10/2014   Procedure: SHOULDER ARTHROSCOPY WITH BICEPSTENOTOMY;  Surgeon: Tania Ade, MD;  Location: [place];  Service: Orthopedics;  Laterality: Right;  . SHOULDER ARTHROSCOPY WITH DISTAL CLAVICLE RESECTION Right 05/10/2014   Procedure: SHOULDER ARTHROSCOPY WITH DISTAL CLAVICLE RESECTION DEBRIDEMENT OF ROTATOR CUFF TEAR;  Surgeon: Tania Ade, MD;  Location: [place];  Service:  Orthopedics;  Laterality: Right;  . SHOULDER ARTHROSCOPY WITH SUBACROMIAL DECOMPRESSION Right 05/10/2014   Procedure: SHOULDER ARTHROSCOPY WITH SUBACROMIAL DECOMPRESSION;  Surgeon: Tania Ade, MD;  Location: [place];  Service: Orthopedics;  Laterality: Right;  . THUMB ARTHROSCOPY  3/16   left  . VEIN LIGATION     left    There were no vitals filed for this visit.   Subjective Assessment - 07/17/19 1051    Subjective  Pt reports with the switch to neoprene thumb spica at night she was able to sleep well the last couple nights.  She has had some stomach issues after taking her one time a week methatrexate dosage.  Wants to talk to MD about the side effect and if it is working for her.  Joints are still hurting.    Pertinent History Pt was seen in the past for R hand pain - had CTS and trigger finger release on R about 2019 - now refer by Rheumathogy for L hand pain , swelling -and since month ago on methotrexate  - trying to get her off prednisone-    Patient Stated Goals I want the pain and swelling better in my L hand so I can get more strength and maintain my motion    Currently in Pain? Yes    Pain Score 3     Pain Location Hand    Pain Orientation Left    Pain Descriptors / Indicators  Aching    Pain Type Chronic pain    Pain Onset More than a month ago    Pain Frequency Intermittent           Patient's pain level remains at 3/10 over 3rd digit A1 pulley, pain at thumb also present but improved at night with implementation of soft neoprene thumb spica.  Patient wearing compression glove during the day for shorter periods of time for edema control.  Discussed positioning of arm with sleeping with use of pillow to also help with edema control at night.    Patient seen for contrast to left UE for 11 mins, alternating warm/cold for edema control.    Following contrast, patient seen for manual therapy techniques with soft tissue mobilization to left hand and forearm,  light sweeping of forearm with graston nr2 tool, to decrease pain and increase ROM.   Tendon gliding exercises for 10 reps each, gentle ROM for thumb in all directions.    Iontophoresis as outlined in flow sheet below to decrease inflammation, decrease pain and increase motion.              OT Treatments/Exercises (OP) - 07/17/19 1056      Iontophoresis   Type of Iontophoresis Dexamethasone    Location 3rd A1pulley on L     Dose small patch , 1.5    Time 28      LUE Contrast Bath   Time 11 minutes    Comments decrease pain , prior to soft tissue                   OT Education - 07/17/19 1053    Education Details HEP    Person(s) Educated Patient    Methods Explanation;Demonstration;Tactile cues;Verbal cues;Handout    Comprehension Verbal cues required;Returned demonstration;Verbalized understanding               OT Long Term Goals - 07/07/19 1358      OT LONG TERM GOAL #1   Title Pt to be independent in HEP to decrease pain and edema to 2/10 at the worse with functional use    Baseline pain 3-5/10 L hand -and tenderness over A1pulley of 3rd 8/10    Time 3    Period Weeks    Status New    Target Date 07/28/19      OT LONG TERM GOAL #2   Title L grip strength increase with more than 5 lbs to be able to hold or grip object with more ease    Baseline R grip 35 lbs, L 19 lbs - 3 point grip R 12 lbs ,L 6 lbs    Time 4    Period Weeks    Status New    Target Date 08/04/19                 Plan - 07/17/19 1055    Clinical Impression Statement Patient able to sleep better the last couple nights after switching to soft neoprene thumb spica at night for base of thumb pain and not using compression glove at night since it tends to place too much pressure at the base of the thumb and causing irritation.  Compression glove during the day for shorter periods of time.  Will continue to monitor pain in the thumb and adjust plan as needed.  Patient starting  to see some decreased pain/relief over 3rd A1 pulley  this date after ionto.  Continue current plan of care to decrease pain,  increase ROM, and improve functional use of left hand for necessary daily tasks.    OT Occupational Profile and History Problem Focused Assessment - Including review of records relating to presenting problem    Occupational performance deficits (Please refer to evaluation for details): ADL's;IADL's;Play;Leisure    Body Structure / Function / Physical Skills ADL;Decreased knowledge of use of DME;Flexibility;ROM;UE functional use;Edema;Pain;Strength;IADL    Rehab Potential Good    Clinical Decision Making Limited treatment options, no task modification necessary    Comorbidities Affecting Occupational Performance: May have comorbidities impacting occupational performance    Modification or Assistance to Complete Evaluation  No modification of tasks or assist necessary to complete eval    OT Frequency 2x / week    OT Duration 4 weeks    OT Treatment/Interventions Self-care/ADL training;Iontophoresis;Contrast Bath;Ultrasound;Manual Therapy;Patient/family education;Splinting;Therapeutic exercise    Plan assess progress with HEP    OT Home Exercise Plan see pt instruction    Consulted and Agree with Plan of Care Patient           Patient will benefit from skilled therapeutic intervention in order to improve the following deficits and impairments:   Body Structure / Function / Physical Skills: ADL, Decreased knowledge of use of DME, Flexibility, ROM, UE functional use, Edema, Pain, Strength, IADL       Visit Diagnosis: Pain in left hand  Localized edema  Muscle weakness (generalized)    Problem List Patient Active Problem List   Diagnosis Date Noted  . Lumbar stenosis with neurogenic claudication 04/12/2015   Kylian Loh T Quana Chamberlain, OTR/L, CLT  Yesennia Hirota 07/17/2019, 11:10 AM  [facility] [address] [place], [address] Phone: [phone]   Fax:  [phone]  Name: Karen Ramirez MRN: [id number] Date of Birth: July 22, 1950

## 2019-07-21 ENCOUNTER — Other Ambulatory Visit: Payer: Self-pay

## 2019-07-21 ENCOUNTER — Ambulatory Visit: Payer: Medicare Other | Admitting: Occupational Therapy

## 2019-07-21 DIAGNOSIS — M6281 Muscle weakness (generalized): Secondary | ICD-10-CM

## 2019-07-21 DIAGNOSIS — M79642 Pain in left hand: Secondary | ICD-10-CM | POA: Diagnosis not present

## 2019-07-21 DIAGNOSIS — R600 Localized edema: Secondary | ICD-10-CM

## 2019-07-21 NOTE — Patient Instructions (Signed)
See note

## 2019-07-21 NOTE — Therapy (Signed)
[facility] [address] [place], [address] Phone: [phone]   Fax:  [phone]  Occupational Therapy Treatment  Patient Details  Name: AUNIKA KIRSTEN MRN: [id number] Date of Birth: 08-12-1950 Referring Provider (OT): Jefm Bryant   Encounter Date: 07/21/2019   OT End of Session - 07/21/19 1226    Visit Number 5    Number of Visits 8    Date for OT Re-Evaluation 08/04/19    OT Start Time 1147    OT Stop Time 1236    OT Time Calculation (min) 49 min    Activity Tolerance Patient tolerated treatment well    Behavior During Therapy [facility] for tasks assessed/performed           Past Medical History:  Diagnosis Date  . Arthritis   . Complication of anesthesia   . DDD (degenerative disc disease), lumbar   . Dermatitis   . GERD (gastroesophageal reflux disease)   . Hypercholesteremia   . Hypothyroidism   . PONV (postoperative nausea and vomiting)   . Wears glasses     Past Surgical History:  Procedure Laterality Date  . ABDOMINAL HYSTERECTOMY  1986  . BACK SURGERY    . BLEPHAROPLASTY     both eyes  . COLONOSCOPY    . ESOPHAGOGASTRODUODENOSCOPY    . LUMBAR LAMINECTOMY  1989  . LUMBAR LAMINECTOMY WITH COFLEX 2 LEVEL N/A 04/12/2015   Procedure: Lumbar two- three,  Lumbar three- four Laminectomy with Coflex;  Surgeon: Kristeen Miss, MD;  Location: [place] [facility];  Service: Neurosurgery;  Laterality: N/A;  L2-3 L3-4 Laminectomy with Coflex  . SHOULDER ARTHROSCOPY WITH BICEPSTENOTOMY Right 05/10/2014   Procedure: SHOULDER ARTHROSCOPY WITH BICEPSTENOTOMY;  Surgeon: Tania Ade, MD;  Location: [place];  Service: Orthopedics;  Laterality: Right;  . SHOULDER ARTHROSCOPY WITH DISTAL CLAVICLE RESECTION Right 05/10/2014   Procedure: SHOULDER ARTHROSCOPY WITH DISTAL CLAVICLE RESECTION DEBRIDEMENT OF ROTATOR CUFF TEAR;  Surgeon: Tania Ade, MD;  Location: [place];  Service:  Orthopedics;  Laterality: Right;  . SHOULDER ARTHROSCOPY WITH SUBACROMIAL DECOMPRESSION Right 05/10/2014   Procedure: SHOULDER ARTHROSCOPY WITH SUBACROMIAL DECOMPRESSION;  Surgeon: Tania Ade, MD;  Location: [place];  Service: Orthopedics;  Laterality: Right;  . THUMB ARTHROSCOPY  3/16   left  . VEIN LIGATION     left    There were no vitals filed for this visit.   Subjective Assessment - 07/21/19 1146    Subjective  Hand feels better- the swelling is down and pain not more than 3/10 - they switching my to shots for my methatrexate - thumb bothering me more    Pertinent History Pt was seen in the past for R hand pain - had CTS and trigger finger release on R about 2019 - now refer by Rheumathogy for L hand pain , swelling -and since month ago on methotrexate  - trying to get her off prednisone-    Patient Stated Goals I want the pain and swelling better in my L hand so I can get more strength and maintain my motion    Currently in Pain? Yes    Pain Score 2     Pain Location Hand    Pain Orientation Left    Pain Descriptors / Indicators Tender    Pain Type Chronic pain    Pain Onset More than a month ago              [facility]  OT Assessment - 07/21/19 0001      Strength   Right Hand Grip (lbs) 35    Right Hand Lateral Pinch 14 lbs    Left Hand Grip (lbs) 23    Left Hand Lateral Pinch 10 lbs    Left Hand 3 Point Pinch 7 lbs      Left Hand AROM   L Index  MCP 0-90 85 Degrees    L Index PIP 0-100 100 Degrees    L Long  MCP 0-90 90 Degrees    L Long PIP 0-100 100 Degrees    L Ring  MCP 0-90 9090 Degrees    L Little  MCP 0-90 90 Degrees    L Little PIP 0-100 100 Degrees           increase MC and PIP flexion - cont to have some edema over MC's - tenderness over A1pulley of 3rd 2/10 now Great improvement- pain at eval 5/10 - decrease to 2/10  Some thumb CMC pain since last week -pt to wear night time thumb and wrist wrap and during day as needed for thumb  pain - alternate with isotoner glove Grip and prehension increase on L -and 3 point pinch with wrap increase to 8 lbs and no pain         skin check done prior to ionto - no issues - and pt to keep on for hour afterwards   OT Treatments/Exercises (OP) - 07/21/19 0001      Iontophoresis   Type of Iontophoresis Dexamethasone    Location 3rd A1pulley on L     Dose small patch , 2.0 current    Time 19      LUE Contrast Bath   Time 8 minutes    Comments decrease edema and pain prior to ROM            contrast done prior to soft tissue Massage to webspace and MC spreads -and gentle traction    Contrast= 3 x day cont at home    Pain free tendon glides - 8 reps each -stop when feeling pull  AAROM for thumb PA and RA  And wrist in all planes   pain one more ionto session and then follow up 2 wks later with me          OT Education - 07/21/19 1226    Education Details progress and POC    Person(s) Educated Patient    Methods Explanation;Demonstration;Tactile cues;Verbal cues;Handout    Comprehension Verbal cues required;Returned demonstration;Verbalized understanding               OT Long Term Goals - 07/07/19 1358      OT LONG TERM GOAL #1   Title Pt to be independent in HEP to decrease pain and edema to 2/10 at the worse with functional use    Baseline pain 3-5/10 L hand -and tenderness over A1pulley of 3rd 8/10    Time 3    Period Weeks    Status New    Target Date 07/28/19      OT LONG TERM GOAL #2   Title L grip strength increase with more than 5 lbs to be able to hold or grip object with more ease    Baseline R grip 35 lbs, L 19 lbs - 3 point grip R 12 lbs ,L 6 lbs    Time 4    Period Weeks    Status New    Target Date 08/04/19  Plan - 07/21/19 1227    Clinical Impression Statement Pt made goo progres from [facility] in pain , edema and AROM in L hand and digits - decrease tendeness over A1pulley of 3rd -and increase  prehesion and grip strength - pt wear thumb /wrist wrap some during day to decrease thumb pain    OT Occupational Profile and History Problem Focused Assessment - Including review of records relating to presenting problem    Occupational performance deficits (Please refer to evaluation for details): ADL's;IADL's;Play;Leisure    Body Structure / Function / Physical Skills ADL;Decreased knowledge of use of DME;Flexibility;ROM;UE functional use;Edema;Pain;Strength;IADL    Rehab Potential Good    Clinical Decision Making Limited treatment options, no task modification necessary    Comorbidities Affecting Occupational Performance: May have comorbidities impacting occupational performance    Modification or Assistance to Complete Evaluation  No modification of tasks or assist necessary to complete eval    OT Frequency 2x / week    OT Duration 2 weeks    OT Treatment/Interventions Self-care/ADL training;Iontophoresis;Contrast Bath;Ultrasound;Manual Therapy;Patient/family education;Splinting;Therapeutic exercise    Plan assess progress with HEP    OT Home Exercise Plan see pt instruction    Consulted and Agree with Plan of Care Patient           Patient will benefit from skilled therapeutic intervention in order to improve the following deficits and impairments:   Body Structure / Function / Physical Skills: ADL, Decreased knowledge of use of DME, Flexibility, ROM, UE functional use, Edema, Pain, Strength, IADL       Visit Diagnosis: Pain in left hand  Localized edema  Muscle weakness (generalized)    Problem List Patient Active Problem List   Diagnosis Date Noted  . Lumbar stenosis with neurogenic claudication 04/12/2015    Rosalyn Gess OTR/L,CLT  07/21/2019, 12:38 PM  [facility] [address], [place], [address] Phone: [phone]   Fax:  [phone]  Name: YAMNA MACKEL MRN: [id number] Date of  Birth: October 29, 1950

## 2019-07-23 ENCOUNTER — Ambulatory Visit: Payer: Medicare Other | Admitting: Occupational Therapy

## 2019-07-23 ENCOUNTER — Encounter: Payer: Self-pay | Admitting: Occupational Therapy

## 2019-07-23 ENCOUNTER — Other Ambulatory Visit: Payer: Self-pay

## 2019-07-23 DIAGNOSIS — M79642 Pain in left hand: Secondary | ICD-10-CM

## 2019-07-23 DIAGNOSIS — R600 Localized edema: Secondary | ICD-10-CM

## 2019-07-23 DIAGNOSIS — M6281 Muscle weakness (generalized): Secondary | ICD-10-CM

## 2019-07-23 DIAGNOSIS — M25652 Stiffness of left hip, not elsewhere classified: Secondary | ICD-10-CM

## 2019-07-23 DIAGNOSIS — M25552 Pain in left hip: Secondary | ICD-10-CM

## 2019-07-23 NOTE — Therapy (Signed)
[facility] [address], [place], [address] Phone: [phone]   Fax:  [phone]  Occupational Therapy Treatment  Patient Details  Name: Karen Ramirez MRN: [id number] Date of Birth: 08-04-1950 Referring Provider (OT): Jefm Bryant   Encounter Date: 07/23/2019   OT End of Session - 07/23/19 1610    Visit Number 6    Number of Visits 8    Date for OT Re-Evaluation 08/04/19    OT Start Time 1346    OT Stop Time 1430    OT Time Calculation (min) 44 min    Activity Tolerance Patient tolerated treatment well    Behavior During Therapy [facility] for tasks assessed/performed           Past Medical History:  Diagnosis Date   Arthritis    Complication of anesthesia    DDD (degenerative disc disease), lumbar    Dermatitis    GERD (gastroesophageal reflux disease)    Hypercholesteremia    Hypothyroidism    PONV (postoperative nausea and vomiting)    Wears glasses     Past Surgical History:  Procedure Laterality Date   ABDOMINAL HYSTERECTOMY  1986   BACK SURGERY     BLEPHAROPLASTY     both eyes   COLONOSCOPY     ESOPHAGOGASTRODUODENOSCOPY     LUMBAR LAMINECTOMY  1989   LUMBAR LAMINECTOMY WITH COFLEX 2 LEVEL N/A 04/12/2015   Procedure: Lumbar two- three,  Lumbar three- four Laminectomy with Coflex;  Surgeon: Kristeen Miss, MD;  Location: [facility];  Service: Neurosurgery;  Laterality: N/A;  L2-3 L3-4 Laminectomy with Coflex   SHOULDER ARTHROSCOPY WITH BICEPSTENOTOMY Right 05/10/2014   Procedure: SHOULDER ARTHROSCOPY WITH BICEPSTENOTOMY;  Surgeon: Tania Ade, MD;  Location: [place];  Service: Orthopedics;  Laterality: Right;   SHOULDER ARTHROSCOPY WITH DISTAL CLAVICLE RESECTION Right 05/10/2014   Procedure: SHOULDER ARTHROSCOPY WITH DISTAL CLAVICLE RESECTION DEBRIDEMENT OF ROTATOR CUFF TEAR;  Surgeon: Tania Ade, MD;  Location: [place];  Service:  Orthopedics;  Laterality: Right;   SHOULDER ARTHROSCOPY WITH SUBACROMIAL DECOMPRESSION Right 05/10/2014   Procedure: SHOULDER ARTHROSCOPY WITH SUBACROMIAL DECOMPRESSION;  Surgeon: Tania Ade, MD;  Location: [place];  Service: Orthopedics;  Laterality: Right;   THUMB ARTHROSCOPY  3/16   left   VEIN LIGATION     left    There were no vitals filed for this visit.   Subjective Assessment - 07/23/19 1355    Subjective  Patient reports her hand is getting better, has some back pain 6/10    Pertinent History Pt was seen in the past for R hand pain - had CTS and trigger finger release on R about 2019 - now refer by Rheumathogy for L hand pain , swelling -and since month ago on methotrexate  - trying to get her off prednisone-    Patient Stated Goals I want the pain and swelling better in my L hand so I can get more strength and maintain my motion    Currently in Pain? Yes    Pain Score 6     Pain Location Back    Pain Orientation Lower;Left    Pain Onset More than a month ago    Pain Frequency Intermittent          Contrast to left hand for edema control  prior to manual therapy for soft tissue mobilization  Following contrast, manual therapy skills for soft tissue mobilization with carpal spreads,  massage to webspace and gentle traction performed  Improved MP flexion and PIP flexion, mild edema still present over MC's, no tenderness over A1 pulley this date.   Some thumb CMC pain since last week -pt to wear night time thumb and wrist wrap and during day as needed for thumb pain - alternate with isotoner glove Grip and prehension increased on L   Contrast= 3 x daycont at home   Pain free tendon glides 10 reps each -stop when feeling pull  AAROM for thumb PA and RA, wrist in all planes    skin check performed  prior to ionto, no issues, pt to keep on for hour afterwards   Will follow up with therapist in 2 weeks                   OT  Education - 07/23/19 1610    Education Details HEP    Person(s) Educated Patient    Methods Explanation;Demonstration;Tactile cues;Verbal cues;Handout    Comprehension Verbal cues required;Returned demonstration;Verbalized understanding               OT Long Term Goals - 07/07/19 1358      OT LONG TERM GOAL #1   Title Pt to be independent in HEP to decrease pain and edema to 2/10 at the worse with functional use    Baseline pain 3-5/10 L hand -and tenderness over A1pulley of 3rd 8/10    Time 3    Period Weeks    Status New    Target Date 07/28/19      OT LONG TERM GOAL #2   Title L grip strength increase with more than 5 lbs to be able to hold or grip object with more ease    Baseline R grip 35 lbs, L 19 lbs - 3 point grip R 12 lbs ,L 6 lbs    Time 4    Period Weeks    Status New    Target Date 08/04/19                 Plan - 07/23/19 1610    Clinical Impression Statement Patient with no pain at A1 pulley this date and decreased pain at thumb with use of neoprene thumb spica wrap.  Edema decreased but still present around MCPs and continues to perform contrast at home as a part of her home program.  She is able to demonstrate exercises for home program and continue for 2 weeks and follow up with therapist.    OT Occupational Profile and History Problem Focused Assessment - Including review of records relating to presenting problem    Occupational performance deficits (Please refer to evaluation for details): ADL's;IADL's;Play;Leisure    Body Structure / Function / Physical Skills ADL;Decreased knowledge of use of DME;Flexibility;ROM;UE functional use;Edema;Pain;Strength;IADL    Rehab Potential Good    Clinical Decision Making Limited treatment options, no task modification necessary    Comorbidities Affecting Occupational Performance: May have comorbidities impacting occupational performance    Modification or Assistance to Complete Evaluation  No modification of tasks  or assist necessary to complete eval    OT Frequency 2x / week    OT Duration 2 weeks    OT Treatment/Interventions Self-care/ADL training;Iontophoresis;Contrast Bath;Ultrasound;Manual Therapy;Patient/family education;Splinting;Therapeutic exercise    Plan assess progress with HEP    OT Home Exercise Plan see pt instruction    Consulted and Agree with Plan of Care Patient           Patient  will benefit from skilled therapeutic intervention in order to improve the following deficits and impairments:   Body Structure / Function / Physical Skills: ADL, Decreased knowledge of use of DME, Flexibility, ROM, UE functional use, Edema, Pain, Strength, IADL       Visit Diagnosis: Pain in left hand  Localized edema  Muscle weakness (generalized)  Pain in left hip  Stiffness of left hip, not elsewhere classified    Problem List Patient Active Problem List   Diagnosis Date Noted   Lumbar stenosis with neurogenic claudication 04/12/2015   Shawndell Schillaci T Jessa Stinson, OTR/L, CLT  Argyle Gustafson 07/23/2019, 4:26 PM  [facility] [address] [place], [address] Phone: [phone]   Fax:  [phone]  Name: Karen Ramirez MRN: [id number] Date of Birth: 04/30/1950

## 2019-08-04 ENCOUNTER — Ambulatory Visit: Payer: Medicare Other | Attending: Rheumatology | Admitting: Occupational Therapy

## 2019-08-04 ENCOUNTER — Other Ambulatory Visit: Payer: Self-pay

## 2019-08-04 DIAGNOSIS — M6281 Muscle weakness (generalized): Secondary | ICD-10-CM | POA: Insufficient documentation

## 2019-08-04 DIAGNOSIS — R600 Localized edema: Secondary | ICD-10-CM | POA: Diagnosis present

## 2019-08-04 DIAGNOSIS — M79642 Pain in left hand: Secondary | ICD-10-CM | POA: Diagnosis present

## 2019-08-04 NOTE — Therapy (Signed)
[facility] [address] [place], [address] Phone: [phone]   Fax:  [phone]  Occupational Therapy Treatment/discharge   Patient Details  Name: JANUARY BERGTHOLD MRN: [id number] Date of Birth: Jan 04, 1950 Referring Provider (OT): Jefm Bryant   Encounter Date: 08/04/2019   OT End of Session - 08/04/19 1322    Visit Number 7    Number of Visits 7    Date for OT Re-Evaluation 08/04/19    OT Start Time 1300    OT Stop Time 1319    OT Time Calculation (min) 19 min    Activity Tolerance Patient tolerated treatment well    Behavior During Therapy WFL for tasks assessed/performed           Past Medical History:  Diagnosis Date   Arthritis    Complication of anesthesia    DDD (degenerative disc disease), lumbar    Dermatitis    GERD (gastroesophageal reflux disease)    Hypercholesteremia    Hypothyroidism    PONV (postoperative nausea and vomiting)    Wears glasses     Past Surgical History:  Procedure Laterality Date   ABDOMINAL HYSTERECTOMY  1986   BACK SURGERY     BLEPHAROPLASTY     both eyes   COLONOSCOPY     ESOPHAGOGASTRODUODENOSCOPY     LUMBAR LAMINECTOMY  1989   LUMBAR LAMINECTOMY WITH COFLEX 2 LEVEL N/A 04/12/2015   Procedure: Lumbar two- three,  Lumbar three- four Laminectomy with Coflex;  Surgeon: Kristeen Miss, MD;  Location: [facility];  Service: Neurosurgery;  Laterality: N/A;  L2-3 L3-4 Laminectomy with Coflex   SHOULDER ARTHROSCOPY WITH BICEPSTENOTOMY Right 05/10/2014   Procedure: SHOULDER ARTHROSCOPY WITH BICEPSTENOTOMY;  Surgeon: Tania Ade, MD;  Location: [facility];  Service: Orthopedics;  Laterality: Right;   SHOULDER ARTHROSCOPY WITH DISTAL CLAVICLE RESECTION Right 05/10/2014   Procedure: SHOULDER ARTHROSCOPY WITH DISTAL CLAVICLE RESECTION DEBRIDEMENT OF ROTATOR CUFF TEAR;  Surgeon: Tania Ade, MD;  Location: [facility];   Service: Orthopedics;  Laterality: Right;   SHOULDER ARTHROSCOPY WITH SUBACROMIAL DECOMPRESSION Right 05/10/2014   Procedure: SHOULDER ARTHROSCOPY WITH SUBACROMIAL DECOMPRESSION;  Surgeon: Tania Ade, MD;  Location: [facility];  Service: Orthopedics;  Laterality: Right;   THUMB ARTHROSCOPY  3/16   left   VEIN LIGATION     left    There were no vitals filed for this visit.   Subjective Assessment - 08/04/19 1320    Subjective  My hands been doing great- no pain when making fist or resting - still tender if I push in my palm - but my hip is bothering me when I stand or walk -    Pertinent History Pt was seen in the past for R hand pain - had CTS and trigger finger release on R about 2019 - now refer by Rheumathogy for L hand pain , swelling -and since month ago on methotrexate  - trying to get her off prednisone-    Patient Stated Goals I want the pain and swelling better in my L hand so I can get more strength and maintain my motion    Currently in Pain? Yes    Pain Score 6     Pain Location Hip    Pain Orientation Left    Pain Descriptors / Indicators Aching;Stabbing;Shooting    Pain Type Chronic pain    Pain Onset 1 to 4 weeks ago    Aggravating Factors  standing and walking              [facility] OT Assessment - 08/04/19 0001      Strength   Right Hand Grip (lbs) 41    Right Hand Lateral Pinch 14 lbs    Right Hand 3 Point Pinch 12 lbs    Left Hand Grip (lbs) 29    Left Hand Lateral Pinch 11.5 lbs    Left Hand 3 Point Pinch 10 lbs           measurement taken - see flow sheet- increase grip and 3 point grip   AROM in MC 90 except 2nd 85  And PIP's 95-100 degrees  No pain at rest or with making fist Still tender over A1pulleyof 4th - 3/10  And 3rd 1-2/10  Minimal edema over dorsal MC's of 2nd and 3rd-    Had in the past some thumb CMC pain -pt to wear night time thumb and wrist wrap and during day as needed for thumb pain - alternate with isotoner  glove Thumb pain improved  Grip and prehension increased on L  ed pt if having increase edema , pain then do Contrast= 3 x day  and if only stiffness can do heat And ice massage over A1pulley's that is tender   Pain free tendon glides 10 reps each -stop when feeling pull AAROM for thumb PA and RA, wrist in all planes  Opposition to all digits pain free -AROM  To cont with at home                 OT Education - 08/04/19 1321    Education Details discharge instructions    Person(s) Educated Patient    Methods Explanation;Demonstration;Tactile cues;Verbal cues;Handout    Comprehension Verbal cues required;Returned demonstration;Verbalized understanding               OT Long Term Goals - 08/04/19 1323      OT LONG TERM GOAL #1   Title Pt to be independent in HEP to decrease pain and edema to 2/10 at the worse with functional use    Baseline no pain at rest or making fist- tender over A1pulley 3rd and 4th - less than 2/10    Status Achieved      OT LONG TERM GOAL #2   Title L grip strength increase with more than 5 lbs to be able to hold or grip object with more ease    Baseline R grip 35 lbs, L 19 lbs - 3 point grip R 12 lbs ,L 6 lbs- and NOW 41 R and L 29 lbs and 3 point R 12 , L 10 lbs    Status Achieved                 Plan - 08/04/19 1322    Clinical Impression Statement Pt made great prpgress in edema, pain in L hand - with increase AROM and increase grip and prehension strength - pain was 3-8/10 and now 0-3/10 - tenderness over A1pulley decrease , AROM WNL and grip and 3 point increase to in range compare to R- report increase strength gripping objects -  pt discharge at this time from OT/hand therapy    OT Occupational Profile and History Problem Focused Assessment - Including review of records relating to presenting problem    Occupational performance deficits (Please refer to evaluation for details): ADL's;IADL's;Play;Leisure    Body  Structure / Function / Physical Skills ADL;Decreased knowledge of use  of DME;Flexibility;ROM;UE functional use;Edema;Pain;Strength;IADL    Rehab Potential Good    Clinical Decision Making Limited treatment options, no task modification necessary    Comorbidities Affecting Occupational Performance: May have comorbidities impacting occupational performance    Modification or Assistance to Complete Evaluation  No modification of tasks or assist necessary to complete eval    OT Home Exercise Plan discharge instructions    Consulted and Agree with Plan of Care Patient           Patient will benefit from skilled therapeutic intervention in order to improve the following deficits and impairments:   Body Structure / Function / Physical Skills: ADL, Decreased knowledge of use of DME, Flexibility, ROM, UE functional use, Edema, Pain, Strength, IADL       Visit Diagnosis: Pain in left hand  Localized edema  Muscle weakness (generalized)    Problem List Patient Active Problem List   Diagnosis Date Noted   Lumbar stenosis with neurogenic claudication 04/12/2015    Rosalyn Gess  OTR/L,CLT 08/04/2019, 1:28 PM  [facility] [address], [place], [address] Phone: [phone]   Fax:  [phone]  Name: BETSEY SOSSAMON MRN: [id number] Date of Birth: [birth year]

## 2020-01-07 ENCOUNTER — Other Ambulatory Visit: Payer: Self-pay | Admitting: Internal Medicine

## 2020-01-07 DIAGNOSIS — Z1231 Encounter for screening mammogram for malignant neoplasm of breast: Secondary | ICD-10-CM

## 2020-02-22 ENCOUNTER — Other Ambulatory Visit: Payer: Self-pay

## 2020-02-22 ENCOUNTER — Ambulatory Visit
Admission: RE | Admit: 2020-02-22 | Discharge: 2020-02-22 | Disposition: A | Discharge status: Home / Self Care | Payer: Medicare Other | Source: Ambulatory Visit | Attending: Internal Medicine | Admitting: Internal Medicine

## 2020-02-22 DIAGNOSIS — Z1231 Encounter for screening mammogram for malignant neoplasm of breast: Secondary | ICD-10-CM

## 2020-02-25 ENCOUNTER — Other Ambulatory Visit (HOSPITAL_COMMUNITY): Payer: Self-pay | Admitting: Neurological Surgery

## 2020-02-25 ENCOUNTER — Other Ambulatory Visit: Payer: Self-pay | Admitting: Neurological Surgery

## 2020-02-25 DIAGNOSIS — M5126 Other intervertebral disc displacement, lumbar region: Secondary | ICD-10-CM

## 2020-02-26 ENCOUNTER — Ambulatory Visit (HOSPITAL_COMMUNITY)
Admission: RE | Admit: 2020-02-26 | Discharge: 2020-02-26 | Disposition: A | Discharge status: Home / Self Care | Payer: Medicare Other | Source: Ambulatory Visit | Attending: Neurological Surgery | Admitting: Neurological Surgery

## 2020-02-26 ENCOUNTER — Other Ambulatory Visit: Payer: Self-pay

## 2020-02-26 DIAGNOSIS — M4316 Spondylolisthesis, lumbar region: Secondary | ICD-10-CM | POA: Diagnosis not present

## 2020-02-26 DIAGNOSIS — N200 Calculus of kidney: Secondary | ICD-10-CM | POA: Insufficient documentation

## 2020-02-26 DIAGNOSIS — M5126 Other intervertebral disc displacement, lumbar region: Secondary | ICD-10-CM

## 2020-02-26 DIAGNOSIS — M47819 Spondylosis without myelopathy or radiculopathy, site unspecified: Secondary | ICD-10-CM | POA: Insufficient documentation

## 2020-02-26 DIAGNOSIS — M5116 Intervertebral disc disorders with radiculopathy, lumbar region: Secondary | ICD-10-CM | POA: Insufficient documentation

## 2020-02-26 DIAGNOSIS — Z981 Arthrodesis status: Secondary | ICD-10-CM | POA: Diagnosis not present

## 2020-02-26 DIAGNOSIS — M48062 Spinal stenosis, lumbar region with neurogenic claudication: Secondary | ICD-10-CM | POA: Diagnosis present

## 2020-02-26 DIAGNOSIS — K449 Diaphragmatic hernia without obstruction or gangrene: Secondary | ICD-10-CM | POA: Diagnosis not present

## 2020-02-26 MED ORDER — DIAZEPAM 5 MG PO TABS
10.0000 mg | ORAL_TABLET | Freq: Once | ORAL | Status: AC
  Administered 2020-02-26: 10 mg via ORAL

## 2020-02-26 MED ORDER — PREDNISONE 20 MG PO TABS
20.0000 mg | ORAL_TABLET | Freq: Once | ORAL | Status: AC
  Administered 2020-02-26: 20 mg via ORAL
  Filled 2020-02-26: qty 1

## 2020-02-26 MED ORDER — HYDROCODONE-ACETAMINOPHEN 5-325 MG PO TABS
1.0000 | ORAL_TABLET | ORAL | Status: DC | PRN
  Administered 2020-02-26: 1 via ORAL
  Filled 2020-02-26: qty 1

## 2020-02-26 MED ORDER — ONDANSETRON HCL 4 MG/2ML IJ SOLN: 4.0000 mg | Freq: Four times a day (QID) | INTRAMUSCULAR | Status: DC | PRN

## 2020-02-26 MED ORDER — DIAZEPAM 5 MG PO TABS
ORAL_TABLET | ORAL | Status: AC
  Filled 2020-02-26: qty 2

## 2020-02-26 MED ORDER — IOHEXOL 180 MG/ML  SOLN
20.0000 mL | Freq: Once | INTRAMUSCULAR | Status: AC | PRN
  Administered 2020-02-26: 12 mL via INTRATHECAL

## 2020-02-26 MED ORDER — LIDOCAINE HCL (PF) 1 % IJ SOLN
5.0000 mL | Freq: Once | INTRAMUSCULAR | Status: AC
  Administered 2020-02-26: 1 mL via INTRADERMAL

## 2020-02-26 NOTE — Procedures (Signed)
Ms. Jansma is a 70 year old individual who has had significant spondylitic stenosis in the past. She has previously undergone laminectomy with decompression using a Coflex device and has recently recurred a right lumbar radiculopathy that was felt to be due to a foraminal disc at L2-3 on that right side she underwent decompression and for a couple of days she had good relief but the pain is recurred severely she is now being brought from myelography to better define this process and see what may be creating a severe radiculopathy that has been unrelenting despite strong pain medication.  Pre op Dx: Lumbar stenosis lumbar radiculopathy. History of decompression. Post op Dx: Same Procedure: Lumbar myelogram Surgeon: Hartleigh Edmonston Puncture level: L3-4 Fluid color: Clear colorless Injection: Isovue 180, 12 mL Findings: Severe stenosis L1-L2 not previously appreciated. Further work-up with CT scanning.

## 2020-02-26 NOTE — Discharge Instructions (Signed)
Myelogram  A myelogram is an imaging test. This test checks for problems in the spinal cord and the places where nerves attach to the spinal cord (nerve roots). A dye (contrast material) is put into your spine before the X-ray. This provides a clearer image for your doctor to see. You may need this test if you have a spinal cord problem that cannot be diagnosed with other imaging tests. You may also have this test to check your spine after surgery. Tell a doctor about:  Any allergies you have, especially to iodine.  All medicines you are taking, including vitamins, herbs, eye drops, creams, and over-the-counter medicines.  Any problems you or family members have had with anesthetic medicines or dye.  Any blood disorders you have.  Any surgeries you have had.  Any medical conditions you have or have had, including asthma.  Whether you are pregnant or may be pregnant. What are the risks? Generally, this is a safe procedure. However, problems may occur, including:  Infection.  Bleeding.  Allergic reaction to medicines or dyes.  Damage to your spinal cord or nerves.  Leaking of spinal fluid. This can cause a headache.  Damage to kidneys.  Seizures. This is rare. What happens before the procedure?  Follow instructions from your doctor about what you cannot eat or drink. You may be asked to drink more fluids.  Ask your doctor about changing or stopping your normal medicines. This is important if you take diabetes medicines or blood thinners.  Plan to have someone take you home from the hospital or clinic.  If you will be going home right after the procedure, plan to have someone with you for 24 hours. What happens during the procedure?  You will lie face down on a table.  Your doctor will find the best injection site on your spine. This is most often in the lower back.  This area will be washed with soap.  You will be given a medicine to numb the area (local  anesthetic).  Your doctor will place a long needle into the space around your spinal cord.  A sample of spinal fluid may be taken. This may be sent to the lab for testing.  The dye will be injected into the space around your spinal cord.  The exam table may be tilted. This helps the dye flow up or down your spine.  The X-ray will take images of your spinal cord.  A bandage (dressing) may be placed over the area where the dye was injected. The procedure may vary among doctors and hospitals. What can I expect after the procedure?  You may be monitored until you leave the hospital or clinic. This includes checking your blood pressure, heart rate, breathing rate, and blood oxygen level.  You may feel sore at the injection site. You may have a mild headache.  You will be told to lie flat with your head raised (elevated). This lowers the risk of a headache.  It is up to you to get the results of your procedure. Ask your doctor, or the department that is doing the procedure, when your results will be ready. Follow these instructions at home:  Rest as told by your doctor. Lie flat with your head slightly elevated.  Do not bend, lift, or do hard work for 24-48 hours, or as told by your doctor.  Take over-the-counter and prescription medicines only as told by your doctor.  Take care of your bandage as told by your doctor.  Drink enough fluid to keep your pee (urine) pale yellow.  Bathe or shower as told by your doctor.   Contact a doctor if:  You have a fever.  You have a headache that lasts longer than 24 hours.  You feel sick to your stomach (nauseous).  You vomit.  Your neck is stiff.  Your legs feel numb.  You cannot pee.  You cannot poop (no bowel movement).  You have a rash.  You are itchy or sneezing. Get help right away if:  You have new symptoms or your symptoms get worse.  You have a seizure.  You have trouble breathing. Summary  A myelogram is an  imaging test that checks for problems in the spinal cord and the places where nerves attach to the spinal cord (nerve roots).  Before the procedure, follow instructions from your doctor. You will be told what not to eat or drink, or what medicines to change or stop.  After the procedure, you will be told to lie flat with your head raised (elevated). This will lower your risk of a headache.  Do not bend, lift, or do any hard work for 24-48 hours, or as told by your doctor.  Contact a doctor if you have a stiff neck or numb legs. Get help right away if your symptoms get worse, or you have a seizure or trouble breathing. This information is not intended to replace advice given to you by your health care provider. Make sure you discuss any questions you have with your health care provider. Document Revised: 02/26/2018 Document Reviewed: 02/27/2018 Elsevier Patient Education  [place].

## 2020-03-03 ENCOUNTER — Other Ambulatory Visit: Payer: Self-pay | Admitting: Neurological Surgery

## 2020-04-01 ENCOUNTER — Other Ambulatory Visit: Payer: Self-pay

## 2020-04-01 ENCOUNTER — Other Ambulatory Visit
Admission: RE | Admit: 2020-04-01 | Discharge: 2020-04-01 | Disposition: A | Discharge status: Home / Self Care | Payer: Medicare Other | Source: Ambulatory Visit | Attending: Neurological Surgery | Admitting: Neurological Surgery

## 2020-04-01 DIAGNOSIS — Z01812 Encounter for preprocedural laboratory examination: Secondary | ICD-10-CM | POA: Insufficient documentation

## 2020-04-01 DIAGNOSIS — Z20822 Contact with and (suspected) exposure to covid-19: Secondary | ICD-10-CM | POA: Diagnosis not present

## 2020-04-02 LAB — SARS CORONAVIRUS 2 (TAT 6-24 HRS): SARS Coronavirus 2: NEGATIVE

## 2020-04-03 ENCOUNTER — Encounter (HOSPITAL_COMMUNITY): Payer: Self-pay | Admitting: Neurological Surgery

## 2020-04-03 ENCOUNTER — Other Ambulatory Visit: Payer: Self-pay

## 2020-04-03 NOTE — Progress Notes (Signed)
PCP - Dr. Ernst Spell Cardiologist - n/a  PPM/ICD - n/a  Chest x-ray - n/a EKG - DOS Stress Test - 12/2018 ECHO - n/a Cardiac Cath - n/a  Sleep Study - n/a CPAP - n/a    COVID TEST- 04/01/2020   Anesthesia review: no  Patient denies shortness of breath, fever, cough and chest pain at PAT appointment   All instructions explained to the patient, with a verbal understanding of the material. Patient also instructed to self quarantine after being tested for COVID-19. The opportunity to ask questions was provided.

## 2020-04-04 NOTE — Anesthesia Preprocedure Evaluation (Addendum)
Anesthesia Evaluation  Patient identified by MRN, date of birth, ID band Patient awake    Reviewed: Allergy & Precautions, NPO status , Patient's Chart, lab work & pertinent test results  History of Anesthesia Complications (+) PONV and history of anesthetic complications  Airway Mallampati: I  TM Distance: >3 FB Neck ROM: Full    Dental no notable dental hx.    Pulmonary neg pulmonary ROS,    Pulmonary exam normal        Cardiovascular negative cardio ROS Normal cardiovascular exam     Neuro/Psych Spinal stenosis negative psych ROS   GI/Hepatic Neg liver ROS, GERD  Medicated and Controlled,  Endo/Other  Hypothyroidism   Renal/GU negative Renal ROS  negative genitourinary   Musculoskeletal  (+) Arthritis ,   Abdominal   Peds  Hematology negative hematology ROS (+)   Anesthesia Other Findings Day of surgery medications reviewed with patient.  Reproductive/Obstetrics negative OB ROS                            Anesthesia Physical Anesthesia Plan  ASA: II  Anesthesia Plan: General   Post-op Pain Management:    Induction: Intravenous  PONV Risk Score and Plan: 4 or greater and Treatment may vary due to age or medical condition, Ondansetron, Dexamethasone, Midazolam and Propofol infusion  Airway Management Planned: Oral ETT  Additional Equipment: Arterial line  Intra-op Plan:   Post-operative Plan: Extubation in OR  Informed Consent: I have reviewed the patients History and Physical, chart, labs and discussed the procedure including the risks, benefits and alternatives for the proposed anesthesia with the patient or authorized representative who has indicated his/her understanding and acceptance.     Dental advisory given  Plan Discussed with: CRNA  Anesthesia Plan Comments:        Anesthesia Quick Evaluation

## 2020-04-05 ENCOUNTER — Inpatient Hospital Stay (HOSPITAL_COMMUNITY): Payer: Medicare Other | Admitting: Certified Registered Nurse Anesthetist

## 2020-04-05 ENCOUNTER — Other Ambulatory Visit: Payer: Self-pay

## 2020-04-05 ENCOUNTER — Inpatient Hospital Stay (HOSPITAL_COMMUNITY)
Admission: RE | Disposition: A | Discharge status: Skilled Nursing Facility | Payer: Self-pay | Source: Ambulatory Visit | Attending: Neurological Surgery

## 2020-04-05 ENCOUNTER — Encounter (HOSPITAL_COMMUNITY): Payer: Self-pay | Admitting: Neurological Surgery

## 2020-04-05 ENCOUNTER — Inpatient Hospital Stay (HOSPITAL_COMMUNITY)
Admission: RE | Admit: 2020-04-05 | Discharge: 2020-04-08 | DRG: 454 | Disposition: A | Discharge status: Skilled Nursing Facility | Payer: Medicare Other | Source: Ambulatory Visit | Attending: Neurological Surgery | Admitting: Neurological Surgery

## 2020-04-05 ENCOUNTER — Inpatient Hospital Stay (HOSPITAL_COMMUNITY): Payer: Medicare Other

## 2020-04-05 DIAGNOSIS — E7800 Pure hypercholesterolemia, unspecified: Secondary | ICD-10-CM | POA: Diagnosis present

## 2020-04-05 DIAGNOSIS — Z79891 Long term (current) use of opiate analgesic: Secondary | ICD-10-CM | POA: Diagnosis not present

## 2020-04-05 DIAGNOSIS — Z881 Allergy status to other antibiotic agents status: Secondary | ICD-10-CM

## 2020-04-05 DIAGNOSIS — M1990 Unspecified osteoarthritis, unspecified site: Secondary | ICD-10-CM | POA: Diagnosis present

## 2020-04-05 DIAGNOSIS — Z79890 Hormone replacement therapy (postmenopausal): Secondary | ICD-10-CM

## 2020-04-05 DIAGNOSIS — G9611 Dural tear: Secondary | ICD-10-CM | POA: Diagnosis not present

## 2020-04-05 DIAGNOSIS — M4316 Spondylolisthesis, lumbar region: Secondary | ICD-10-CM | POA: Diagnosis present

## 2020-04-05 DIAGNOSIS — E039 Hypothyroidism, unspecified: Secondary | ICD-10-CM | POA: Diagnosis present

## 2020-04-05 DIAGNOSIS — M48062 Spinal stenosis, lumbar region with neurogenic claudication: Secondary | ICD-10-CM | POA: Diagnosis present

## 2020-04-05 DIAGNOSIS — M810 Age-related osteoporosis without current pathological fracture: Secondary | ICD-10-CM | POA: Diagnosis present

## 2020-04-05 DIAGNOSIS — M4186 Other forms of scoliosis, lumbar region: Secondary | ICD-10-CM | POA: Diagnosis present

## 2020-04-05 DIAGNOSIS — Z885 Allergy status to narcotic agent status: Secondary | ICD-10-CM | POA: Diagnosis not present

## 2020-04-05 DIAGNOSIS — Z888 Allergy status to other drugs, medicaments and biological substances status: Secondary | ICD-10-CM

## 2020-04-05 DIAGNOSIS — M4726 Other spondylosis with radiculopathy, lumbar region: Secondary | ICD-10-CM | POA: Diagnosis present

## 2020-04-05 DIAGNOSIS — Z7952 Long term (current) use of systemic steroids: Secondary | ICD-10-CM

## 2020-04-05 DIAGNOSIS — Z803 Family history of malignant neoplasm of breast: Secondary | ICD-10-CM

## 2020-04-05 DIAGNOSIS — K219 Gastro-esophageal reflux disease without esophagitis: Secondary | ICD-10-CM | POA: Diagnosis present

## 2020-04-05 DIAGNOSIS — M5116 Intervertebral disc disorders with radiculopathy, lumbar region: Secondary | ICD-10-CM | POA: Diagnosis present

## 2020-04-05 DIAGNOSIS — Z419 Encounter for procedure for purposes other than remedying health state, unspecified: Secondary | ICD-10-CM

## 2020-04-05 HISTORY — DX: Calculus of kidney: N20.0

## 2020-04-05 HISTORY — DX: Cardiac arrhythmia, unspecified: I49.9

## 2020-04-05 HISTORY — DX: Vitamin D deficiency, unspecified: E55.9

## 2020-04-05 HISTORY — DX: Hyperglycemia, unspecified: R73.9

## 2020-04-05 HISTORY — DX: Localized edema: R60.0

## 2020-04-05 HISTORY — DX: Polymyalgia rheumatica: M35.3

## 2020-04-05 HISTORY — DX: Unspecified hemorrhoids: K64.9

## 2020-04-05 HISTORY — DX: Diverticulosis of intestine, part unspecified, without perforation or abscess without bleeding: K57.90

## 2020-04-05 HISTORY — DX: Age-related osteoporosis without current pathological fracture: M81.0

## 2020-04-05 LAB — CBC
HCT: 42 % (ref 36.0–46.0)
HCT: 34.1 % — ABNORMAL LOW (ref 36.0–46.0)
Hemoglobin: 13.6 g/dL (ref 12.0–15.0)
Hemoglobin: 11.2 g/dL — ABNORMAL LOW (ref 12.0–15.0)
MCH: 31.9 pg (ref 26.0–34.0)
MCH: 31.6 pg (ref 26.0–34.0)
MCHC: 32.4 g/dL (ref 30.0–36.0)
MCHC: 32.8 g/dL (ref 30.0–36.0)
MCV: 98.6 fL (ref 80.0–100.0)
MCV: 96.3 fL (ref 80.0–100.0)
Platelets: 203 10*3/uL (ref 150–400)
Platelets: 192 10*3/uL (ref 150–400)
RBC: 4.26 MIL/uL (ref 3.87–5.11)
RBC: 3.54 MIL/uL — ABNORMAL LOW (ref 3.87–5.11)
RDW: 13.5 % (ref 11.5–15.5)
RDW: 13.5 % (ref 11.5–15.5)
WBC: 7.2 10*3/uL (ref 4.0–10.5)
WBC: 13.8 10*3/uL — ABNORMAL HIGH (ref 4.0–10.5)
nRBC: 0 % (ref 0.0–0.2)
nRBC: 0 % (ref 0.0–0.2)

## 2020-04-05 LAB — SURGICAL PCR SCREEN
MRSA, PCR: NEGATIVE
Staphylococcus aureus: POSITIVE — AB

## 2020-04-05 LAB — TYPE AND SCREEN
ABO/RH(D): O POS
Antibody Screen: NEGATIVE

## 2020-04-05 LAB — BASIC METABOLIC PANEL
Anion gap: 8 (ref 5–15)
BUN: 11 mg/dL (ref 8–23)
CO2: 23 mmol/L (ref 22–32)
Calcium: 8.2 mg/dL — ABNORMAL LOW (ref 8.9–10.3)
Chloride: 105 mmol/L (ref 98–111)
Creatinine, Ser: 0.89 mg/dL (ref 0.44–1.00)
GFR, Estimated: >60 mL/min (ref >60)
Glucose, Bld: 197 mg/dL — ABNORMAL HIGH (ref 70–99)
Potassium: 3.9 mmol/L (ref 3.5–5.1)
Sodium: 136 mmol/L (ref 135–145)

## 2020-04-05 LAB — ABO/RH: ABO/RH(D): O POS

## 2020-04-05 SURGERY — POSTERIOR LUMBAR FUSION 3 LEVEL
Anesthesia: General | Site: Back

## 2020-04-05 MED ORDER — DIAZEPAM 5 MG PO TABS
2.5000 mg | ORAL_TABLET | Freq: Every day | ORAL | Status: DC
  Administered 2020-04-05: 2.5 mg via ORAL
  Filled 2020-04-05 (×3): qty 1

## 2020-04-05 MED ORDER — ROCURONIUM BROMIDE 10 MG/ML (PF) SYRINGE
PREFILLED_SYRINGE | INTRAVENOUS | Status: DC | PRN
  Administered 2020-04-05: 50 mg via INTRAVENOUS
  Administered 2020-04-05 (×2): 20 mg via INTRAVENOUS
  Administered 2020-04-05: 50 mg via INTRAVENOUS
  Administered 2020-04-05 (×2): 20 mg via INTRAVENOUS

## 2020-04-05 MED ORDER — TRIAMCINOLONE ACETONIDE 55 MCG/ACT NA AERO
1.0000 | INHALATION_SPRAY | Freq: Every day | NASAL | Status: DC
  Administered 2020-04-05 – 2020-04-07 (×3): 1 via NASAL
  Filled 2020-04-05: qty 10.8

## 2020-04-05 MED ORDER — PREDNISONE 5 MG PO TABS
5.0000 mg | ORAL_TABLET | Freq: Every morning | ORAL | Status: DC
  Administered 2020-04-06 – 2020-04-08 (×3): 5 mg via ORAL
  Filled 2020-04-05 (×3): qty 1

## 2020-04-05 MED ORDER — FLEET ENEMA 7-19 GM/118ML RE ENEM: 1.0000 | ENEMA | Freq: Once | RECTAL | Status: DC | PRN

## 2020-04-05 MED ORDER — AMISULPRIDE (ANTIEMETIC) 5 MG/2ML IV SOLN
INTRAVENOUS | Status: AC
  Filled 2020-04-05: qty 2

## 2020-04-05 MED ORDER — LEVOTHYROXINE SODIUM 75 MCG PO TABS
75.0000 ug | ORAL_TABLET | Freq: Every day | ORAL | Status: DC
  Administered 2020-04-06 – 2020-04-08 (×3): 75 ug via ORAL
  Filled 2020-04-05 (×3): qty 1

## 2020-04-05 MED ORDER — BUPIVACAINE HCL (PF) 0.5 % IJ SOLN
INTRAMUSCULAR | Status: AC
  Filled 2020-04-05: qty 30

## 2020-04-05 MED ORDER — FENTANYL CITRATE (PF) 250 MCG/5ML IJ SOLN
INTRAMUSCULAR | Status: AC
  Filled 2020-04-05: qty 5

## 2020-04-05 MED ORDER — AMISULPRIDE (ANTIEMETIC) 5 MG/2ML IV SOLN
5.0000 mg | Freq: Once | INTRAVENOUS | Status: AC
  Administered 2020-04-05: 5 mg via INTRAVENOUS

## 2020-04-05 MED ORDER — ONDANSETRON HCL 4 MG/2ML IJ SOLN
INTRAMUSCULAR | Status: AC
  Filled 2020-04-05: qty 2

## 2020-04-05 MED ORDER — ACETAMINOPHEN 500 MG PO TABS
1000.0000 mg | ORAL_TABLET | Freq: Once | ORAL | Status: AC
  Administered 2020-04-05: 1000 mg via ORAL
  Filled 2020-04-05: qty 2

## 2020-04-05 MED ORDER — THROMBIN 5000 UNITS EX SOLR
CUTANEOUS | Status: AC
  Filled 2020-04-05: qty 5000

## 2020-04-05 MED ORDER — ONDANSETRON HCL 4 MG PO TABS: 4.0000 mg | ORAL_TABLET | Freq: Four times a day (QID) | ORAL | Status: DC | PRN

## 2020-04-05 MED ORDER — SODIUM CHLORIDE 0.9% FLUSH
3.0000 mL | Freq: Two times a day (BID) | INTRAVENOUS | Status: DC
  Administered 2020-04-05 – 2020-04-07 (×4): 3 mL via INTRAVENOUS

## 2020-04-05 MED ORDER — PHENOL 1.4 % MT LIQD: 1.0000 | OROMUCOSAL | Status: DC | PRN

## 2020-04-05 MED ORDER — CHLORHEXIDINE GLUCONATE 0.12 % MT SOLN
15.0000 mL | Freq: Once | OROMUCOSAL | Status: AC
  Administered 2020-04-05: 15 mL via OROMUCOSAL
  Filled 2020-04-05: qty 15

## 2020-04-05 MED ORDER — KETOROLAC TROMETHAMINE 15 MG/ML IJ SOLN
7.5000 mg | Freq: Four times a day (QID) | INTRAMUSCULAR | Status: AC
  Administered 2020-04-05 – 2020-04-06 (×4): 7.5 mg via INTRAVENOUS
  Filled 2020-04-05 (×4): qty 1

## 2020-04-05 MED ORDER — POLYVINYL ALCOHOL 1.4 % OP SOLN
1.0000 [drp] | Freq: Every day | OPHTHALMIC | Status: DC
  Administered 2020-04-05 – 2020-04-07 (×3): 1 [drp] via OPHTHALMIC
  Filled 2020-04-05: qty 15

## 2020-04-05 MED ORDER — MORPHINE SULFATE (PF) 2 MG/ML IV SOLN: 2.0000 mg | INTRAVENOUS | Status: DC | PRN

## 2020-04-05 MED ORDER — THROMBIN 20000 UNITS EX KIT
PACK | CUTANEOUS | Status: AC
  Filled 2020-04-05: qty 1

## 2020-04-05 MED ORDER — PROMETHAZINE HCL 25 MG/ML IJ SOLN: 6.2500 mg | INTRAMUSCULAR | Status: DC | PRN

## 2020-04-05 MED ORDER — SIMVASTATIN 20 MG PO TABS
20.0000 mg | ORAL_TABLET | Freq: Every day | ORAL | Status: DC
  Administered 2020-04-05 – 2020-04-07 (×3): 20 mg via ORAL
  Filled 2020-04-05 (×3): qty 1

## 2020-04-05 MED ORDER — BUPIVACAINE HCL (PF) 0.5 % IJ SOLN
INTRAMUSCULAR | Status: DC | PRN
  Administered 2020-04-05: 5 mL

## 2020-04-05 MED ORDER — LACTATED RINGERS IV SOLN: INTRAVENOUS | Status: DC

## 2020-04-05 MED ORDER — METHOCARBAMOL 500 MG PO TABS
500.0000 mg | ORAL_TABLET | Freq: Four times a day (QID) | ORAL | Status: DC | PRN
  Administered 2020-04-05 – 2020-04-08 (×5): 500 mg via ORAL
  Filled 2020-04-05 (×6): qty 1

## 2020-04-05 MED ORDER — FAMOTIDINE 20 MG PO TABS
20.0000 mg | ORAL_TABLET | Freq: Two times a day (BID) | ORAL | Status: DC
  Administered 2020-04-05 – 2020-04-08 (×5): 20 mg via ORAL
  Filled 2020-04-05 (×5): qty 1

## 2020-04-05 MED ORDER — LACTATED RINGERS IV SOLN: INTRAVENOUS | Status: DC | PRN

## 2020-04-05 MED ORDER — FENTANYL CITRATE (PF) 100 MCG/2ML IJ SOLN
INTRAMUSCULAR | Status: DC | PRN
  Administered 2020-04-05 (×8): 50 ug via INTRAVENOUS

## 2020-04-05 MED ORDER — SODIUM CHLORIDE 0.9% FLUSH: 3.0000 mL | INTRAVENOUS | Status: DC | PRN

## 2020-04-05 MED ORDER — METHOCARBAMOL 1000 MG/10ML IJ SOLN
500.0000 mg | Freq: Four times a day (QID) | INTRAVENOUS | Status: DC | PRN
  Filled 2020-04-05: qty 5

## 2020-04-05 MED ORDER — HYDROMORPHONE HCL 1 MG/ML IJ SOLN
0.2500 mg | INTRAMUSCULAR | Status: DC | PRN
  Administered 2020-04-05: 0.25 mg via INTRAVENOUS

## 2020-04-05 MED ORDER — DEXAMETHASONE SODIUM PHOSPHATE 10 MG/ML IJ SOLN
INTRAMUSCULAR | Status: AC
  Filled 2020-04-05: qty 1

## 2020-04-05 MED ORDER — CHLORHEXIDINE GLUCONATE CLOTH 2 % EX PADS: 6.0000 | MEDICATED_PAD | Freq: Once | CUTANEOUS | Status: DC

## 2020-04-05 MED ORDER — POLYETHYLENE GLYCOL 3350 17 G PO PACK: 17.0000 g | PACK | Freq: Every day | ORAL | Status: DC | PRN

## 2020-04-05 MED ORDER — PHENYLEPHRINE HCL-NACL 10-0.9 MG/250ML-% IV SOLN
INTRAVENOUS | Status: DC | PRN
  Administered 2020-04-05: 20 ug/min via INTRAVENOUS

## 2020-04-05 MED ORDER — MIDAZOLAM HCL 2 MG/2ML IJ SOLN
INTRAMUSCULAR | Status: AC
  Filled 2020-04-05: qty 2

## 2020-04-05 MED ORDER — BISACODYL 10 MG RE SUPP: 10.0000 mg | Freq: Every day | RECTAL | Status: DC | PRN

## 2020-04-05 MED ORDER — ACETAMINOPHEN 10 MG/ML IV SOLN
1000.0000 mg | Freq: Once | INTRAVENOUS | Status: DC | PRN
  Administered 2020-04-05: 1000 mg via INTRAVENOUS

## 2020-04-05 MED ORDER — ONDANSETRON HCL 4 MG/2ML IJ SOLN
4.0000 mg | Freq: Four times a day (QID) | INTRAMUSCULAR | Status: DC | PRN
  Administered 2020-04-05: 4 mg via INTRAVENOUS
  Filled 2020-04-05: qty 2

## 2020-04-05 MED ORDER — PROPOFOL 500 MG/50ML IV EMUL
INTRAVENOUS | Status: DC | PRN
  Administered 2020-04-05: 25 ug/kg/min via INTRAVENOUS

## 2020-04-05 MED ORDER — LIDOCAINE-EPINEPHRINE 1 %-1:100000 IJ SOLN
INTRAMUSCULAR | Status: AC
  Filled 2020-04-05: qty 1

## 2020-04-05 MED ORDER — THROMBIN 5000 UNITS EX SOLR
OROMUCOSAL | Status: DC | PRN
  Administered 2020-04-05 (×2): 5 mL via TOPICAL

## 2020-04-05 MED ORDER — CEFAZOLIN SODIUM-DEXTROSE 2-4 GM/100ML-% IV SOLN
2.0000 g | INTRAVENOUS | Status: AC
  Administered 2020-04-05 (×2): 2 g via INTRAVENOUS
  Filled 2020-04-05: qty 100

## 2020-04-05 MED ORDER — DOCUSATE SODIUM 100 MG PO CAPS
100.0000 mg | ORAL_CAPSULE | Freq: Two times a day (BID) | ORAL | Status: DC
  Administered 2020-04-05 – 2020-04-08 (×6): 100 mg via ORAL
  Filled 2020-04-05 (×6): qty 1

## 2020-04-05 MED ORDER — ACETAMINOPHEN 325 MG PO TABS
650.0000 mg | ORAL_TABLET | ORAL | Status: DC | PRN
  Administered 2020-04-08: 650 mg via ORAL
  Filled 2020-04-05 (×2): qty 2

## 2020-04-05 MED ORDER — ROCURONIUM BROMIDE 10 MG/ML (PF) SYRINGE
PREFILLED_SYRINGE | INTRAVENOUS | Status: AC
  Filled 2020-04-05: qty 10

## 2020-04-05 MED ORDER — HYDROCODONE-ACETAMINOPHEN 5-325 MG PO TABS: 1.0000 | ORAL_TABLET | Freq: Every day | ORAL | Status: DC

## 2020-04-05 MED ORDER — ACETAMINOPHEN 650 MG RE SUPP: 650.0000 mg | RECTAL | Status: DC | PRN

## 2020-04-05 MED ORDER — MIDAZOLAM HCL 2 MG/2ML IJ SOLN
INTRAMUSCULAR | Status: DC | PRN
  Administered 2020-04-05: 2 mg via INTRAVENOUS

## 2020-04-05 MED ORDER — SENNA 8.6 MG PO TABS
1.0000 | ORAL_TABLET | Freq: Two times a day (BID) | ORAL | Status: DC
  Administered 2020-04-05 – 2020-04-08 (×5): 8.6 mg via ORAL
  Filled 2020-04-05 (×6): qty 1

## 2020-04-05 MED ORDER — ORAL CARE MOUTH RINSE: 15.0000 mL | Freq: Once | OROMUCOSAL | Status: AC

## 2020-04-05 MED ORDER — ALBUMIN HUMAN 5 % IV SOLN: INTRAVENOUS | Status: DC | PRN

## 2020-04-05 MED ORDER — ACETAMINOPHEN 10 MG/ML IV SOLN
INTRAVENOUS | Status: AC
  Filled 2020-04-05: qty 100

## 2020-04-05 MED ORDER — HYDROCODONE-ACETAMINOPHEN 5-325 MG PO TABS
1.0000 | ORAL_TABLET | Freq: Every day | ORAL | Status: DC
  Administered 2020-04-07: 1 via ORAL
  Filled 2020-04-05: qty 1

## 2020-04-05 MED ORDER — ONDANSETRON HCL 4 MG/2ML IJ SOLN
INTRAMUSCULAR | Status: DC | PRN
  Administered 2020-04-05 (×2): 4 mg via INTRAVENOUS

## 2020-04-05 MED ORDER — PREGABALIN 75 MG PO CAPS
75.0000 mg | ORAL_CAPSULE | Freq: Three times a day (TID) | ORAL | Status: DC
  Administered 2020-04-05 – 2020-04-08 (×8): 75 mg via ORAL
  Filled 2020-04-05 (×8): qty 1

## 2020-04-05 MED ORDER — DEXAMETHASONE SODIUM PHOSPHATE 10 MG/ML IJ SOLN
INTRAMUSCULAR | Status: DC | PRN
  Administered 2020-04-05: 10 mg via INTRAVENOUS

## 2020-04-05 MED ORDER — THROMBIN 20000 UNITS EX SOLR
CUTANEOUS | Status: DC | PRN
  Administered 2020-04-05: 20 mL via TOPICAL

## 2020-04-05 MED ORDER — CEFAZOLIN SODIUM 1 G IJ SOLR
INTRAMUSCULAR | Status: AC
  Filled 2020-04-05: qty 20

## 2020-04-05 MED ORDER — LIDOCAINE 2% (20 MG/ML) 5 ML SYRINGE
INTRAMUSCULAR | Status: DC | PRN
  Administered 2020-04-05: 100 mg via INTRAVENOUS

## 2020-04-05 MED ORDER — PHENYLEPHRINE 40 MCG/ML (10ML) SYRINGE FOR IV PUSH (FOR BLOOD PRESSURE SUPPORT)
PREFILLED_SYRINGE | INTRAVENOUS | Status: DC | PRN
  Administered 2020-04-05: 120 ug via INTRAVENOUS

## 2020-04-05 MED ORDER — PROPOFOL 10 MG/ML IV BOLUS
INTRAVENOUS | Status: DC | PRN
  Administered 2020-04-05: 140 mg via INTRAVENOUS

## 2020-04-05 MED ORDER — CEFAZOLIN SODIUM-DEXTROSE 2-4 GM/100ML-% IV SOLN
2.0000 g | Freq: Three times a day (TID) | INTRAVENOUS | Status: AC
  Administered 2020-04-05 – 2020-04-06 (×2): 2 g via INTRAVENOUS
  Filled 2020-04-05 (×2): qty 100

## 2020-04-05 MED ORDER — 0.9 % SODIUM CHLORIDE (POUR BTL) OPTIME
TOPICAL | Status: DC | PRN
  Administered 2020-04-05: 1000 mL

## 2020-04-05 MED ORDER — MENTHOL 3 MG MT LOZG: 1.0000 | LOZENGE | OROMUCOSAL | Status: DC | PRN

## 2020-04-05 MED ORDER — PROPOFOL 10 MG/ML IV BOLUS
INTRAVENOUS | Status: AC
  Filled 2020-04-05: qty 20

## 2020-04-05 MED ORDER — ALUM & MAG HYDROXIDE-SIMETH 200-200-20 MG/5ML PO SUSP: 30.0000 mL | Freq: Four times a day (QID) | ORAL | Status: DC | PRN

## 2020-04-05 MED ORDER — LIDOCAINE 2% (20 MG/ML) 5 ML SYRINGE
INTRAMUSCULAR | Status: AC
  Filled 2020-04-05: qty 5

## 2020-04-05 MED ORDER — ARTIFICIAL TEARS OPHTHALMIC OINT
TOPICAL_OINTMENT | OPHTHALMIC | Status: AC
  Filled 2020-04-05: qty 3.5

## 2020-04-05 MED ORDER — THROMBIN 5000 UNITS EX SOLR
CUTANEOUS | Status: AC
  Filled 2020-04-05: qty 10000

## 2020-04-05 MED ORDER — HYDROMORPHONE HCL 1 MG/ML IJ SOLN
INTRAMUSCULAR | Status: AC
  Filled 2020-04-05: qty 1

## 2020-04-05 MED ORDER — HYDROCODONE-ACETAMINOPHEN 5-325 MG PO TABS
1.0000 | ORAL_TABLET | ORAL | Status: DC | PRN
Start: 2020-04-05 — End: 2020-04-08
  Administered 2020-04-05 – 2020-04-06 (×3): 2 via ORAL
  Administered 2020-04-06: 1 via ORAL
  Administered 2020-04-06 – 2020-04-07 (×4): 2 via ORAL
  Administered 2020-04-07: 1 via ORAL
  Administered 2020-04-08: 2 via ORAL
  Administered 2020-04-08: 1 via ORAL
  Filled 2020-04-05: qty 1
  Filled 2020-04-05: qty 2
  Filled 2020-04-05: qty 1
  Filled 2020-04-05 (×9): qty 2

## 2020-04-05 MED ORDER — LIDOCAINE-EPINEPHRINE 1 %-1:100000 IJ SOLN
INTRAMUSCULAR | Status: DC | PRN
  Administered 2020-04-05: 5 mL

## 2020-04-05 MED ORDER — SUGAMMADEX SODIUM 200 MG/2ML IV SOLN
INTRAVENOUS | Status: DC | PRN
  Administered 2020-04-05: 200 mg via INTRAVENOUS

## 2020-04-05 SURGICAL SUPPLY — 65 items
BASKET BONE COLLECTION (BASKET) ×2 IMPLANT
BLADE CLIPPER SURG (BLADE) IMPLANT
BONE CANC CHIPS 20CC PCAN1/4 (Bone Implant) ×2 IMPLANT
BUR MATCHSTICK NEURO 3.0 LAGG (BURR) ×2 IMPLANT
CAGE COROENT MP 8X9X23M-8 SPIN (Cage) ×4 IMPLANT
CAGE COROENT PLIF 10X28-8 LUMB (Cage) ×8 IMPLANT
CANISTER SUCT 3000ML PPV (MISCELLANEOUS) ×2 IMPLANT
CHIPS CANC BONE 20CC PCAN1/4 (Bone Implant) ×1 IMPLANT
CNTNR URN SCR LID CUP LEK RST (MISCELLANEOUS) ×2 IMPLANT
CONT SPEC 4OZ STRL OR WHT (MISCELLANEOUS) ×2
COVER BACK TABLE 60X90IN (DRAPES) ×2 IMPLANT
COVER WAND RF STERILE (DRAPES) ×2 IMPLANT
DECANTER SPIKE VIAL GLASS SM (MISCELLANEOUS) ×2 IMPLANT
DERMABOND ADVANCED (GAUZE/BANDAGES/DRESSINGS) ×1
DERMABOND ADVANCED .7 DNX12 (GAUZE/BANDAGES/DRESSINGS) ×1 IMPLANT
DEVICE DISSECT PLASMABLAD 3.0S (MISCELLANEOUS) ×1 IMPLANT
DRAPE C-ARM 42X72 X-RAY (DRAPES) ×4 IMPLANT
DRAPE HALF SHEET 40X57 (DRAPES) IMPLANT
DRAPE LAPAROTOMY 100X72X124 (DRAPES) ×2 IMPLANT
DURAPREP 26ML APPLICATOR (WOUND CARE) ×2 IMPLANT
DURASEAL APPLICATOR TIP (TIP) IMPLANT
DURASEAL SPINE SEALANT 3ML (MISCELLANEOUS) IMPLANT
ELECT REM PT RETURN 9FT ADLT (ELECTROSURGICAL) ×2
ELECTRODE REM PT RTRN 9FT ADLT (ELECTROSURGICAL) ×1 IMPLANT
GAUZE 4X4 16PLY RFD (DISPOSABLE) IMPLANT
GAUZE SPONGE 4X4 12PLY STRL (GAUZE/BANDAGES/DRESSINGS) ×2 IMPLANT
GLOVE BIOGEL PI IND STRL 8.5 (GLOVE) ×2 IMPLANT
GLOVE BIOGEL PI INDICATOR 8.5 (GLOVE) ×2
GLOVE ECLIPSE 8.5 STRL (GLOVE) ×4 IMPLANT
GLOVE SURG POLYISO LF SZ7.5 (GLOVE) ×8 IMPLANT
GLOVE SURG UNDER POLY LF SZ6.5 (GLOVE) ×4 IMPLANT
GLOVE SURG UNDER POLY LF SZ7.5 (GLOVE) ×6 IMPLANT
GOWN STRL REUS W/ TWL LRG LVL3 (GOWN DISPOSABLE) ×3 IMPLANT
GOWN STRL REUS W/ TWL XL LVL3 (GOWN DISPOSABLE) ×1 IMPLANT
GOWN STRL REUS W/TWL 2XL LVL3 (GOWN DISPOSABLE) ×4 IMPLANT
GOWN STRL REUS W/TWL LRG LVL3 (GOWN DISPOSABLE) ×3
GOWN STRL REUS W/TWL XL LVL3 (GOWN DISPOSABLE) ×1
GRAFT BONE PROTEIOS LRG 5CC (Orthopedic Implant) ×2 IMPLANT
HEMOSTAT POWDER KIT SURGIFOAM (HEMOSTASIS) ×4 IMPLANT
KIT BASIN OR (CUSTOM PROCEDURE TRAY) ×2 IMPLANT
KIT TURNOVER KIT B (KITS) ×2 IMPLANT
MILL MEDIUM DISP (BLADE) ×2 IMPLANT
NEEDLE HYPO 22GX1.5 SAFETY (NEEDLE) ×2 IMPLANT
NS IRRIG 1000ML POUR BTL (IV SOLUTION) ×2 IMPLANT
PACK LAMINECTOMY NEURO (CUSTOM PROCEDURE TRAY) ×2 IMPLANT
PAD ARMBOARD 7.5X6 YLW CONV (MISCELLANEOUS) ×6 IMPLANT
PATTIES SURGICAL .5 X.5 (GAUZE/BANDAGES/DRESSINGS) ×2 IMPLANT
PATTIES SURGICAL .5 X1 (DISPOSABLE) ×4 IMPLANT
PLASMABLADE 3.0S (MISCELLANEOUS) ×2
ROD RELINE-O 5.5X100 LORD (Rod) ×4 IMPLANT
SCREW LOCK RELINE 5.5 TULIP (Screw) ×16 IMPLANT
SCREW RELINE-O POLY 6.5X45 (Screw) ×16 IMPLANT
SPONGE LAP 4X18 RFD (DISPOSABLE) ×2 IMPLANT
SPONGE SURGIFOAM ABS GEL 100 (HEMOSTASIS) ×2 IMPLANT
SUT PROLENE 6 0 BV (SUTURE) ×2 IMPLANT
SUT VIC AB 1 CT1 18XBRD ANBCTR (SUTURE) ×1 IMPLANT
SUT VIC AB 1 CT1 8-18 (SUTURE) ×1
SUT VIC AB 2-0 CP2 18 (SUTURE) ×2 IMPLANT
SUT VIC AB 3-0 SH 8-18 (SUTURE) ×2 IMPLANT
SUT VIC AB 4-0 RB1 18 (SUTURE) ×2 IMPLANT
SYR 3ML LL SCALE MARK (SYRINGE) ×8 IMPLANT
TOWEL GREEN STERILE (TOWEL DISPOSABLE) ×2 IMPLANT
TOWEL GREEN STERILE FF (TOWEL DISPOSABLE) ×2 IMPLANT
TRAY FOLEY MTR SLVR 16FR STAT (SET/KITS/TRAYS/PACK) ×2 IMPLANT
WATER STERILE IRR 1000ML POUR (IV SOLUTION) ×2 IMPLANT

## 2020-04-05 NOTE — H&P (Signed)
Karen Ramirez is an 70 y.o. female.    Chief complaint: Back and right leg pain now with left leg pain History of present illness: Karen Ramirez is a 70 year old individual who about 2 months ago underwent surgical decompression of extraforaminal disc protrusion at the level of L2-3 on the right.  She really had minimal improvement.  Preoperative MRI demonstrated that she had disc disease at L2-3 AND L3-4 and also a large disc protrusion at L1-2 eccentric to the left side she is not having any left leg symptoms.  Postoperatively we did a myelogram and postmyelogram CAT scan that demonstrated significant degenerative changes.  Patient has had a previous Coflex operation about 4 years ago and had done well for a period of the last 3 years until she started developing increasing radicular symptoms in the past year.  After evaluating her myelogram post myelogram CAT scan I advised that she would be best served with a decompression and stabilization from L1-L4 as she was developing a significant degenerative scoliosis in addition to the disc herniations at the levels described.  She is now admitted for that procedure.   Past Medical History:  Diagnosis Date  . Arthritis   . Complication of anesthesia   . DDD (degenerative disc disease), lumbar   . Dermatitis   . Diverticulosis   . Dysrhythmia    pt states "occassionally"   . GERD (gastroesophageal reflux disease)   . Hemorrhoids   . Hypercholesteremia   . Hyperglycemia   . Hypothyroidism   . Leg edema   . Osteoporosis   . PMR (polymyalgia rheumatica) (HCC)   . PONV (postoperative nausea and vomiting)   . Renal stone   . Vitamin D deficiency   . Wears glasses     Past Surgical History:  Procedure Laterality Date  . ABDOMINAL HYSTERECTOMY  1986  . APPENDECTOMY    . BACK SURGERY    . BILATERAL CARPAL TUNNEL RELEASE  2014  . BLEPHAROPLASTY  2016   both eyes  . COLONOSCOPY    . ESOPHAGOGASTRODUODENOSCOPY    . LUMBAR LAMINECTOMY  1989   . LUMBAR LAMINECTOMY WITH COFLEX 2 LEVEL N/A 04/12/2015   Procedure: Lumbar two- three,  Lumbar three- four Laminectomy with Coflex;  Surgeon: Kristeen Miss, MD;  Location: [facility];  Service: Neurosurgery;  Laterality: N/A;  L2-3 L3-4 Laminectomy with Coflex  . SHOULDER ARTHROSCOPY WITH BICEPSTENOTOMY Right 05/10/2014   Procedure: SHOULDER ARTHROSCOPY WITH BICEPSTENOTOMY;  Surgeon: Tania Ade, MD;  Location: [facility];  Service: Orthopedics;  Laterality: Right;  . SHOULDER ARTHROSCOPY WITH DISTAL CLAVICLE RESECTION Right 05/10/2014   Procedure: SHOULDER ARTHROSCOPY WITH DISTAL CLAVICLE RESECTION DEBRIDEMENT OF ROTATOR CUFF TEAR;  Surgeon: Tania Ade, MD;  Location: [facility];  Service: Orthopedics;  Laterality: Right;  . SHOULDER ARTHROSCOPY WITH SUBACROMIAL DECOMPRESSION Right 05/10/2014   Procedure: SHOULDER ARTHROSCOPY WITH SUBACROMIAL DECOMPRESSION;  Surgeon: Tania Ade, MD;  Location: [facility];  Service: Orthopedics;  Laterality: Right;  . THUMB ARTHROSCOPY  3/16   left  . TONSILLECTOMY    . VEIN LIGATION     left leg    Family History  Problem Relation Age of Onset  . Breast cancer Maternal Aunt 70  . Breast cancer Maternal Aunt 70   Social History:  reports that she has never smoked. She has never used smokeless tobacco. She reports current alcohol use. She reports that she does not use drugs.  Allergies:  Allergies  Allergen Reactions  .  Azithromycin Diarrhea  . Levaquin [Levofloxacin] Nausea And Vomiting  . Ultram [Tramadol] Nausea And Vomiting    Medications Prior to Admission  Medication Sig Dispense Refill  . carboxymethylcellulose (REFRESH PLUS) 0.5 % SOLN Place 1 drop into both eyes at bedtime.    . celecoxib (CELEBREX) 200 MG capsule Take 200 mg by mouth 2 (two) times daily as needed for moderate pain.    . Cholecalciferol (VITAMIN D3) 125 MCG (5000 UT) TABS Take 5,000 Units by mouth every other day.    .  diazepam (VALIUM) 5 MG tablet Take 2.5 mg by mouth at bedtime.    . famotidine (PEPCID) 20 MG tablet Take 20 mg by mouth 2 (two) times daily before a meal.    . folic acid (FOLVITE) 1 MG tablet Take 2 mg by mouth daily.    Marland Kitchen HYDROcodone-acetaminophen (NORCO/VICODIN) 5-325 MG tablet Take 1 tablet by mouth at bedtime.    Marland Kitchen levothyroxine (SYNTHROID, LEVOTHROID) 75 MCG tablet Take 75 mcg by mouth daily before breakfast.    . predniSONE (DELTASONE) 5 MG tablet Take 5 mg by mouth in the morning.    . pregabalin (LYRICA) 75 MG capsule Take 75 mg by mouth 3 (three) times daily.    . Pseudoephedrine-Ibuprofen (IBUPROFEN COLD & SINUS PO) Take 1 tablet by mouth at bedtime.    . simvastatin (ZOCOR) 20 MG tablet Take 20 mg by mouth at bedtime.     . triamcinolone (NASACORT) 55 MCG/ACT AERO nasal inhaler Place 1 spray into the nose at bedtime.      Results for orders placed or performed during the hospital encounter of 04/05/20 (from the past 48 hour(s))  ABO/Rh     Status: None (Preliminary result)   Collection Time: 04/05/20  6:24 AM  Result Value Ref Range   ABO/RH(D) PENDING   Type and screen     Status: None (Preliminary result)   Collection Time: 04/05/20  6:29 AM  Result Value Ref Range   ABO/RH(D) PENDING    Antibody Screen PENDING    Sample Expiration      04/08/2020,2359 Performed at [facility] [address] [place], [place] [address]   CBC per protocol     Status: None   Collection Time: 04/05/20  6:47 AM  Result Value Ref Range   WBC 7.2 4.0 - 10.5 K/uL   RBC 4.26 3.87 - 5.11 MIL/uL   Hemoglobin 13.6 12.0 - 15.0 g/dL   HCT 42.0 36.0 - 46.0 %   MCV 98.6 80.0 - 100.0 fL   MCH 31.9 26.0 - 34.0 pg   MCHC 32.4 30.0 - 36.0 g/dL   RDW 13.5 11.5 - 15.5 %   Platelets 203 150 - 400 K/uL   nRBC 0.0 0.0 - 0.2 %    Comment: Performed at [facility], [place] [address], [place], [place] [address]   No results found.  Review of Systems  Constitutional: Positive for activity  change.  HENT: Negative.   Eyes: Negative.   Respiratory: Negative.   Cardiovascular: Negative.   Gastrointestinal: Negative.   Endocrine: Negative.   Genitourinary: Negative.   Musculoskeletal: Positive for back pain and myalgias.  Allergic/Immunologic: Negative.   Neurological: Positive for weakness and numbness.  Hematological: Negative.   Psychiatric/Behavioral: Negative.     Blood pressure (!) 141/82, pulse 79, temperature 98.5 F (36.9 C), temperature source Oral, resp. rate 18, height 5\' 2"  (1.575 m), weight 78.5 kg, SpO2 98 %. Physical Exam Constitutional:  Appearance: Normal appearance.  HENT:     Head: Normocephalic and atraumatic.     Right Ear: Tympanic membrane normal.     Left Ear: Tympanic membrane normal.     Nose: Nose normal.     Mouth/Throat:     Mouth: Mucous membranes are moist.  Eyes:     Extraocular Movements: Extraocular movements intact.     Pupils: Pupils are equal, round, and reactive to light.  Cardiovascular:     Rate and Rhythm: Normal rate and regular rhythm.     Pulses: Normal pulses.     Heart sounds: Normal heart sounds.  Pulmonary:     Effort: Pulmonary effort is normal.     Breath sounds: Normal breath sounds.  Abdominal:     General: Abdomen is flat.     Palpations: Abdomen is soft.  Musculoskeletal:     Cervical back: Normal range of motion and neck supple.     Comments: Straight leg raising in either lower extremity at 30 degrees with pain radiating down ipsilateral leg more so into the right lower extremity even with left leg straight leg raising lumbar spine is nontender to palpation or percussion.  Skin:    General: Skin is warm and dry.     Capillary Refill: Capillary refill takes less than 2 seconds.  Neurological:     Mental Status: She is alert.     Comments: Weakness in the iliopsoas graded at 4 out of 5 in the right lower extremity.  Left lower extremity has mild tibialis anterior weakness to 4 out of 5.  Gastroc  strength appears intact.  Deep tendon reflexes are absent in the patella and the Achilles both.  Upper extremity reflexes and strength is normal cranial nerve examination is normal.  Psychiatric:        Mood and Affect: Mood normal.        Behavior: Behavior normal.        Thought Content: Thought content normal.        Judgment: Judgment normal.      Assessment/Plan Degenerative disks L1-L2 3 L3-4 with herniated nucleus pulposus L1-2 and L2-3 bilateral lumbar radiculopathy.  Degenerative scoliosis.  Plan: Posterior decompression fusion L1 and L4.  Earleen Newport, MD 04/05/2020, 7:50 AM

## 2020-04-05 NOTE — Op Note (Signed)
Date of surgery: 04/05/2020 Preoperative diagnosis: Spondylolisthesis L2-3 and L3-4 recurrent herniated nucleus pulposus L2-3 herniated nucleus pulposus L1-2 with lumbar stenosis lumbar radiculopathy neurogenic claudication Postoperative diagnosis: Same Procedure: Laminectomy L1-L2 and L3 with decompression of the common dural tube in the L1 the L2, L3 and L4 nerve roots with more work than required for simple interbody arthrodesis.  Posterior interbody arthrodesis with peek spacers local autograft allograft and Proteus L1-L2 3 L3-4 posterior lateral arthrodesis with local autograft allograft and Proteus L1-L4 segmental fixation L1-L4 with pedicle screws.  Surgeon: Kristeen Miss Anesthesia: General endotracheal Indications: Karen Ramirez is a 70 year old individual who has had a previous decompression with a Coflex device being inserted.  She did well for a number of years but then developed a severe right lumbar radiculopathy was found to have an extraforaminal disc herniation at the level of L2-3.  She underwent decompression of this but despite this had persistent pain and subsequent myelography demonstrated a large herniated disc on the left side at L1-L2 causing severe central stenosis.  After careful consideration I discussed and advised her regarding decompression and fusion of L1-L2 3 and L3-4 because of the degree of spondylolisthesis and stenosis that was present.  She is being taken to the operating room for that procedure.  Procedure: Patient was brought to the operating room supine on the stretcher.  After the smooth induction of general endotracheal anesthesia, she was carefully turned prone.  The back was prepped with alcohol DuraPrep and draped in a sterile fashion.  After infiltrating with lidocaine with epinephrine mixed 50-50 with Marcaine and elliptical incision was made around her posterior previous scar.  This was excised and the dissection was carried to the lumbodorsal fascia.  The  fascia was opened on either side of midline to expose the spinous processes of L2-L4.  The Coflex devices were exposed and these were easily loosened.  There were removed.  Some fascia with metallic staining was removed and this was also sent for pathology.  The dissection was then carried out further to expose the facet joints at L3-4 L2-3 and L1-2 and the transverse processes at L1.  The lateral gutters were packed in this region after being decorticated for intertransverse grafting from L1-L4.  Then laminectomies were undertaken removing the inferior margin lamina of L1-L2 and including the entirety of the facet.  Entire laminar arch of L2 and L3 were removed in a piecemeal fashion and a high-speed bur being used to disarticulate the laminar arch in an en bloc fashion down low.  The dura was identified and carefully inspected and then redundant thickened ligamentous material was removed to decompress the common dural tube and each of the individual nerve roots 2 and a 3 mm Kerrison punch was used for this dissection and care was taken to protect the dura and maintain its integrity.  A small dural laceration on the right side along the L4 Hemmeter arch was identified there is no flush CSF leak and this was easily oversewn with a horizontal mattress of 6-0 Prolene.  Then care was taken to protect and identify the path of the L4 nerve root inferiorly and dissected from the disc space at L3-4.  Once all the disc spaces were isolated individually discectomies were performed first at L3-L4 bilaterally in such a fashion to remove the bulk of the disc material from the disc space endplates were curettage smooth and an interbody spreader was used to distract the interspace.  Here it was felt that a 10 x  9 x 23 mm spacer with 8 degrees lordosis would fit best along with a total of 12 cc of bone graft.  This was placed into the interspace attention was then turned to L2-3 where a similar decompression was carried out in  here after the interspace was emptied was also felt that a 10 x 9 x 23 mm spacer with 8 degrees lordosis would fit best this interspace was packed with 9 cc of bone graft.  L1 to yielded a smaller space inhere 8 x 9 x 23 mm spacers with 8 degrees lordosis were placed into the interspace also with 9 cc of bone graft.  Once all the grafting had been performed and [place] was taken to make sure that the individual nerve roots were well decompressed pedicle entry sites were chosen at L1 L2-L3-L4 and L5 using fluoroscopic guidance then 6.5 x 45 mm pedicle screws were placed individually into the L1-L2-L3 and L4.  All the time monitoring with the fluoroscope was obtained.  Once the screws were placed the lateral gutters which had previously been packed off work packed with a total of 12 cc of bone graft into each lateral gutter from L1-L4.  Then precontoured rods were used to connect the screw heads together these measured 100 mm in length and were 5.5 mm in diameter.  A final radiograph was obtained in AP and lateral projection.  Final inspection was obtained to verify that of the individual nerve roots were well decompressed and when this was verified hemostasis in the soft tissues was obtained meticulously and then 25 cc of half percent Marcaine was injected into the paraspinous fascia.  The lumbodorsal fascia was closed with #1 Vicryl in interrupted fashion 2-0 Vicryl was used in the subcutaneous tissues 3-0 Vicryl in the deep subcuticular layer and 4-0 Vicryl and the final subcuticular closure blood loss for the procedure was 650 cc and 195 cc of Cell Saver blood was returned to the patient.

## 2020-04-05 NOTE — Anesthesia Procedure Notes (Signed)
Procedure Name: Intubation Date/Time: 04/05/2020 8:06 AM Performed by: Genelle Bal, CRNA Pre-anesthesia Checklist: Patient identified, Emergency Drugs available, Suction available and Patient being monitored Patient Re-evaluated:Patient Re-evaluated prior to induction Oxygen Delivery Method: Circle system utilized Preoxygenation: Pre-oxygenation with 100% oxygen Induction Type: IV induction Ventilation: Mask ventilation without difficulty Laryngoscope Size: Miller and 2 Grade View: Grade I Tube type: Oral Tube size: 7.0 mm Number of attempts: 1 Airway Equipment and Method: Stylet and Oral airway Placement Confirmation: ETT inserted through vocal cords under direct vision,  positive ETCO2 and breath sounds checked- equal and bilateral Secured at: 21 cm Tube secured with: Tape Dental Injury: Teeth and Oropharynx as per pre-operative assessment

## 2020-04-05 NOTE — Anesthesia Procedure Notes (Signed)
Arterial Line Insertion Start/End4/05/2020 7:05 AM, 04/05/2020 7:15 AM Performed by: Brennan Bailey, MD, Gwyndolyn Saxon, CRNA, CRNA  Patient location: Pre-op. Preanesthetic checklist: patient identified, IV checked, site marked, risks and benefits discussed, surgical consent, monitors and equipment checked, pre-op evaluation, timeout performed and anesthesia consent Lidocaine 1% used for infiltration Right, radial was placed Catheter size: 20 Fr Hand hygiene performed  and maximum sterile barriers used   Attempts: 1 Procedure performed without using ultrasound guided technique. Following insertion, dressing applied. Post procedure assessment: normal and unchanged  Patient tolerated the procedure well with no immediate complications.

## 2020-04-05 NOTE — Transfer of Care (Signed)
Immediate Anesthesia Transfer of Care Note  Patient: FLOY ANGERT  Procedure(s) Performed: Lumbar one-two, Lumbar two-three, Lumbar three-four Posterior lumbar interbody fusion with pedicle screw fixation from Lumbar one to Lumbar four removal colflex (N/A Back)  Patient Location: PACU  Anesthesia Type:General  Level of Consciousness: drowsy and patient cooperative  Airway & Oxygen Therapy: Patient Spontanous Breathing and Patient connected to face mask oxygen  Post-op Assessment: Report given to RN and Post -op Vital signs reviewed and stable  Post vital signs: Reviewed  Last Vitals:  Vitals Value Taken Time  BP 134/91 04/05/20 1443  Temp    Pulse 96 04/05/20 1446  Resp 16 04/05/20 1446  SpO2 100 % 04/05/20 1446  Vitals shown include unvalidated device data.  Last Pain:  Vitals:   04/05/20 0706  TempSrc:   PainSc: 6       Patients Stated Pain Goal: 3 (12/25/47 7530)  Complications: No complications documented.

## 2020-04-06 LAB — BASIC METABOLIC PANEL
Anion gap: 10 (ref 5–15)
BUN: 14 mg/dL (ref 8–23)
CO2: 25 mmol/L (ref 22–32)
Calcium: 8.5 mg/dL — ABNORMAL LOW (ref 8.9–10.3)
Chloride: 101 mmol/L (ref 98–111)
Creatinine, Ser: 1.08 mg/dL — ABNORMAL HIGH (ref 0.44–1.00)
GFR, Estimated: 56 mL/min — ABNORMAL LOW (ref >60)
Glucose, Bld: 161 mg/dL — ABNORMAL HIGH (ref 70–99)
Potassium: 3.7 mmol/L (ref 3.5–5.1)
Sodium: 136 mmol/L (ref 135–145)

## 2020-04-06 LAB — CBC
HCT: 30.2 % — ABNORMAL LOW (ref 36.0–46.0)
Hemoglobin: 9.9 g/dL — ABNORMAL LOW (ref 12.0–15.0)
MCH: 31.7 pg (ref 26.0–34.0)
MCHC: 32.8 g/dL (ref 30.0–36.0)
MCV: 96.8 fL (ref 80.0–100.0)
Platelets: 164 10*3/uL (ref 150–400)
RBC: 3.12 MIL/uL — ABNORMAL LOW (ref 3.87–5.11)
RDW: 13.8 % (ref 11.5–15.5)
WBC: 14.1 10*3/uL — ABNORMAL HIGH (ref 4.0–10.5)
nRBC: 0 % (ref 0.0–0.2)

## 2020-04-06 LAB — SURGICAL PATHOLOGY

## 2020-04-06 MED FILL — Thrombin For Soln 5000 Unit: CUTANEOUS | Qty: 5000 | Status: AC

## 2020-04-06 MED FILL — Thrombin For Soln Kit 20000 Unit: CUTANEOUS | Qty: 1 | Status: AC

## 2020-04-06 NOTE — Progress Notes (Signed)
PT Cancellation Note  Patient Details Name: Karen Ramirez MRN: [id number] DOB: [birth year]   Cancelled Treatment:    Reason Eval/Treat Not Completed: Fatigue/lethargy limiting ability to participate. Pt finishing up with OT upon PT arrival. Pt appears very fatigued and having difficulty maintaining midline in chair. OT suggesting pt needs time to rest prior to PT session and pt nodding head in agreement. Will check back as schedule allows to initiate PT evaluation.    Thelma Comp 04/06/2020, 9:51 AM   Rolinda Roan, PT, DPT Acute Rehabilitation Services Pager: [phone] Office: [phone]

## 2020-04-06 NOTE — Progress Notes (Signed)
Patient ID: Karen Ramirez, female   DOB: 1950-07-02, 70 y.o.   MRN: [id number] Vital signs show blood pressure 98/60 heart rate is under 100 Temperature is normal labs show hemoglobin 9.9 white count 14,000 and blood sugars elevated at 167 creatinine with slight bump to 1.08 At this point I believe she would do well with continued hydration with her IV fluids running at 150 an hour She feels generally weak today She notes that she does not tolerate Percocet We will increase her hydrocodone to 2 tabs every 4 hours Her motor function appears good distally she has some proximal weakness in the iliopsoas at 4 out of 5 bilaterally Incision appears clean and dry   I have discussed her care with Dr. Pieter Partridge Dawley he will see her in my absence over the next 2 days Overall she is doing well day 1 postop

## 2020-04-06 NOTE — Evaluation (Signed)
Physical Therapy Evaluation Patient Details Name: Karen Ramirez MRN: [id number] DOB: Jul 30, 1950 Today's Date: 04/06/2020   History of Present Illness  Pt is a 70 y/o admitted s/p L1-L4 PLIF on 04/05/2020. PMH includes DDD, Diverticulosis, Dysrhythmia, Hypothyroidism,  Osteoporosis, PMR, Shoulder arthroscopy with distal clavicle resection, bicepstenotomy, subacromial decompression (Right, 05/10/2014); Bilateral carpal tunnel release (2014).  Clinical Impression  Pt admitted with above diagnosis. At the time of PT eval, pt was able to demonstrate transfers and ambulation with up to +2 max assist for transfers initially. Husband present for session as well. They were both educated on precautions, brace application/wearing schedule, appropriate activity progression, and car transfer. Pt requiring significant assistance at times for functional mobility. If she does not show improvement next session may want to consider a higher level of care at d/c. Pt currently with functional limitations due to the deficits listed below (see PT Problem List). Pt will benefit from skilled PT to increase their independence and safety with mobility to allow discharge to the venue listed below.      Follow Up Recommendations Home health PT;Supervision for mobility/OOB    Equipment Recommendations  Rolling walker with 5" wheels    Recommendations for Other Services       Precautions / Restrictions Precautions Precautions: Back;Fall Precaution Booklet Issued: Yes (comment) Precaution Comments: Reviewed handout briefly and pt was cued for precautions during functional mobility. Required Braces or Orthoses: Spinal Brace Spinal Brace: Lumbar corset;Applied in sitting position Restrictions Weight Bearing Restrictions: No      Mobility  Bed Mobility Overal bed mobility: Needs Assistance Bed Mobility: Sidelying to Sit   Sidelying to sit: Min assist       General bed mobility comments: VC's for log roll  technique. Assist to elevate trunk to full sitting position and assist required to don brace.    Transfers Overall transfer level: Needs assistance Equipment used: Rolling walker (2 wheeled) Transfers: Sit to/from Stand Sit to Stand: Max assist;Mod assist;Min assist;+2 physical assistance;+2 safety/equipment;From elevated surface         General transfer comment: Pt initially required +2 max assist and raised bed height to power-up to full stand. Later in session pt requiring progressively less assist, moving to +2 mod assist and then +2 min assist by end of session.  Ambulation/Gait Ambulation/Gait assistance: Min assist;+2 safety/equipment Gait Distance (Feet): 200 Feet Assistive device: Rolling walker (2 wheeled) Gait Pattern/deviations: Step-through pattern;Decreased stride length;Trunk flexed;Narrow base of support Gait velocity: Decreased Gait velocity interpretation: <1.31 ft/sec, indicative of household ambulator General Gait Details: Slow and guarded due to pain. Pt appeared effortful with steps initially and then appeared to be more comfortable by end of gait training. Chair follow utilized as pt with mild buckling of LLE throughout gait training.  Stairs            Wheelchair Mobility    Modified Rankin (Stroke Patients Only)       Balance Overall balance assessment: Needs assistance Sitting-balance support: Single extremity supported;Feet supported Sitting balance-Leahy Scale: Fair     Standing balance support: Single extremity supported;Bilateral upper extremity supported Standing balance-Leahy Scale: Poor Standing balance comment: dependent on UE support                             Pertinent Vitals/Pain Pain Assessment: Faces Faces Pain Scale: Hurts even more Pain Location: L hip and back Pain Descriptors / Indicators: Discomfort;Grimacing;Sore;Constant;Aching Pain Intervention(s): Limited activity within patient's tolerance;Monitored  during  session;Repositioned    Home Living Family/patient expects to be discharged to:: Private residence Living Arrangements: Spouse/significant other Available Help at Discharge: Family;Available 24 hours/day Type of Home: House Home Access: Level entry     Home Layout: One level Home Equipment: Shower seat - built in;Hand held shower head      Prior Function Level of Independence: Independent               Hand Dominance        Extremity/Trunk Assessment   Upper Extremity Assessment Upper Extremity Assessment: Defer to OT evaluation    Lower Extremity Assessment Lower Extremity Assessment: LLE deficits/detail LLE Deficits / Details: LLE weak and buckling at times during functional mobility. LLE Coordination: decreased gross motor    Cervical / Trunk Assessment Cervical / Trunk Assessment: Other exceptions Cervical / Trunk Exceptions: s/p surgery  Communication   Communication: No difficulties  Cognition Arousal/Alertness: Awake/alert Behavior During Therapy: Flat affect Overall Cognitive Status: Impaired/Different from baseline Area of Impairment: Memory;Safety/judgement;Problem solving                     Memory: Decreased recall of precautions;Decreased short-term memory   Safety/Judgement: Decreased awareness of deficits   Problem Solving: Decreased initiation;Requires verbal cues;Requires tactile cues General Comments: Pt able to remember 1 back precaution from previous sx, overall slow throughout session, required education with multiple repetitions, very flat.      General Comments      Exercises     Assessment/Plan    PT Assessment Patient needs continued PT services  PT Problem List Decreased strength;Decreased activity tolerance;Decreased balance;Decreased mobility;Decreased knowledge of use of DME;Decreased safety awareness;Decreased knowledge of precautions;Pain       PT Treatment Interventions DME instruction;Gait  training;Stair training;Functional mobility training;Therapeutic activities;Therapeutic exercise;Neuromuscular re-education;Patient/family education    PT Goals (Current goals can be found in the Care Plan section)  Acute Rehab PT Goals Patient Stated Goal: get back to walking with husband around pond PT Goal Formulation: With patient/family Time For Goal Achievement: 04/13/20 Potential to Achieve Goals: Good    Frequency Min 5X/week   Barriers to discharge        Co-evaluation               AM-PAC PT "6 Clicks" Mobility  Outcome Measure Help needed turning from your back to your side while in a flat bed without using bedrails?: None Help needed moving from lying on your back to sitting on the side of a flat bed without using bedrails?: A Little Help needed moving to and from a bed to a chair (including a wheelchair)?: A Lot Help needed standing up from a chair using your arms (e.g., wheelchair or bedside chair)?: A Lot Help needed to walk in hospital room?: A Little Help needed climbing 3-5 steps with a railing? : A Lot 6 Click Score: 16    End of Session Equipment Utilized During Treatment: Gait belt;Back brace Activity Tolerance: Patient tolerated treatment well Patient left: in chair;with call bell/phone within reach;with family/visitor present Nurse Communication: Mobility status PT Visit Diagnosis: Unsteadiness on feet (R26.81);Pain Pain - part of body:  (back)    Time: 1401-1431 PT Time Calculation (min) (ACUTE ONLY): 30 min   Charges:   PT Evaluation $PT Eval Low Complexity: 1 Low PT Treatments $Gait Training: 8-22 mins        Rolinda Roan, PT, DPT Acute Rehabilitation Services Pager: [phone] Office: [phone]   Thelma Comp 04/06/2020, 2:56 PM

## 2020-04-06 NOTE — Evaluation (Signed)
Occupational Therapy Evaluation Patient Details Name: Karen Ramirez MRN: [id number] DOB: Sep 04, 1950 Today's Date: 04/06/2020    History of Present Illness Pt is a 70 y/o admitted 04/05/20 for Spondylolisthesis L2-3 and L3-4 recurrent herniated nucleus pulposus L1-3  Now s/p Laminectomy L1-L2 and L3 with decompression and fixation in L1 the L2, L3 and L4 nerve roots with more work than required for simple interbody arthrodesis with pedicle screws. PMH includes Arthritis, DDD, Diverticulosis, Dysrhythmia, Hypercholesteremia, Hypothyroidism, Leg edema, Osteoporosis, Obesity, PMR, Shoulder arthroscopy with distal clavicle resection, bicepstenotomy, subacromial decompression (Right, 05/10/2014); Bilateral carpal tunnel release (2014).   Clinical Impression   Pt reports that she is typically independent in ADL and mobility. Today Pt is max A to come to standing and min A for in room mobility with RW. She is max to total A for all ADL from the knees down. Pt educated on don/doff of brace with demonstration. Able to don brace in sitting with max A. Pt able to perform front peri care after toilet with lateral leans and set up - but asking about assist for rear peri care (will need to bring AE kit next session for toilet aide AND for LB ADL) Pt was able to stand at sink for grooming (face wash, oral care, hand wash) but was VERY fatigued after including L knee buckling to the point where it was a safety concern and Pt had to sit on commode, and OT brought chair as close to the bathroom as possible. Pt required MAX A to come to standing again and was mod A to come to recliner. Pt reporting 8/10 pain in L hip and leg. RN aware and getting medicine. Pt flat throughout session and required repetition of education/safety cues. OT will continue to follow acutely and if transfers do not improve, Pt will need SNF post-acute. Per RN Pt is declining SNF at this time - so OT will also recommend [facility] with hopes of improvement  next session. Next session to bring AE bag for education and focus on transfers.     Follow Up Recommendations  SNF;Home health OT;Supervision/Assistance - 24 hour (if transfers do not improve, will need SNF)    Equipment Recommendations  3 in 1 bedside commode;Other (comment) (potentially AE)    Recommendations for Other Services PT consult     Precautions / Restrictions Precautions Precautions: Back;Fall Precaution Booklet Issued: Yes (comment) Required Braces or Orthoses: Spinal Brace Spinal Brace: Lumbar corset;Applied in sitting position Restrictions Weight Bearing Restrictions: No      Mobility Bed Mobility               General bed mobility comments: recieved sitting EOB and left in recliner    Transfers Overall transfer level: Needs assistance Equipment used: Rolling walker (2 wheeled) Transfers: Sit to/from [facility] Sit to Stand: Max assist Stand pivot transfers: Min assist       General transfer comment: very heavy max of 1 to come into standing from bed and toilet. vc for safe hand placement and biomechanics (bring feet under you etc)    Balance Overall balance assessment: Needs assistance Sitting-balance support: Single extremity supported;Feet supported Sitting balance-Leahy Scale: Fair     Standing balance support: Single extremity supported;Bilateral upper extremity supported Standing balance-Leahy Scale: Poor Standing balance comment: dependent on UE support                           ADL either performed or assessed with  clinical judgement   ADL Overall ADL's : Needs assistance/impaired Eating/Feeding: Set up;Sitting   Grooming: Wash/dry hands;Wash/dry face;Oral care;Min guard;Standing Grooming Details (indicate cue type and reason): fatigues quickly with LLE buckling at the end having to sit on commode for support Upper Body Bathing: Moderate assistance   Lower Body Bathing: Maximal assistance Lower Body  Bathing Details (indicate cue type and reason): unable to reach past knees in sitting or bring foot up to opposite knee for figure 4 position Upper Body Dressing : Maximal assistance;Sitting Upper Body Dressing Details (indicate cue type and reason): brace adjusted and educated on don/doff with demonstration. required step by step assist Lower Body Dressing: Maximal assistance;Sit to/from stand Lower Body Dressing Details (indicate cue type and reason): unable to perform figure 4 or reach past knees Toilet Transfer: Maximal assistance;Cueing for safety;Cueing for sequencing;Minimal assistance;Ambulation;RW Toilet Transfer Details (indicate cue type and reason): max A to come to stand, once on feet min A - L knee buckling with fatigue at EOS Toileting- Clothing Manipulation and Hygiene: Maximal assistance;Sit to/from stand Toileting - Clothing Manipulation Details (indicate cue type and reason): able to perform front peri care with lateral leans, will need significant assist for rear peri care - will need education on AE     Functional mobility during ADLs: Minimal assistance;Maximal assistance;Rolling walker;Cueing for safety;Cueing for sequencing (max A to come to standing and Min A once on feet - watch L knee buckle) General ADL Comments: handout and emphasis on compensatory strategies for ADL - cues throughout session. WILL NEED AE EDUCATION     Vision Baseline Vision/History: Wears glasses Wears Glasses: At all times Patient Visual Report: No change from baseline Vision Assessment?: No apparent visual deficits Additional Comments: with glasses on throughout session     Perception     Praxis      Pertinent Vitals/Pain Pain Assessment: 0-10 Pain Score: 8  Pain Location: L hip and back Pain Descriptors / Indicators: Discomfort;Grimacing;Sore;Constant;Aching Pain Intervention(s): Limited activity within patient's tolerance;Monitored during session;Repositioned;Patient requesting pain  meds-RN notified     Hand Dominance     Extremity/Trunk Assessment Upper Extremity Assessment Upper Extremity Assessment: Generalized weakness   Lower Extremity Assessment Lower Extremity Assessment: LLE deficits/detail;Defer to PT evaluation LLE Deficits / Details: LLE buckling significantly during transfers and ambulation nearing the end of session, pain in L hip at the EOS LLE Coordination: decreased gross motor   Cervical / Trunk Assessment Cervical / Trunk Assessment: Other exceptions Cervical / Trunk Exceptions: s/p lumar sx   Communication Communication Communication: No difficulties   Cognition Arousal/Alertness: Awake/alert Behavior During Therapy: Flat affect Overall Cognitive Status: No family/caregiver present to determine baseline cognitive functioning Area of Impairment: Memory;Safety/judgement;Problem solving                     Memory: Decreased recall of precautions;Decreased short-term memory   Safety/Judgement: Decreased awareness of deficits   Problem Solving: Decreased initiation;Requires verbal cues;Requires tactile cues General Comments: Pt able to remember 1 back precaution from previous sx, overall slow throughout session, required education with multiple repetitions, very flat.   General Comments       Exercises     Shoulder Instructions      Home Living Family/patient expects to be discharged to:: Private residence Living Arrangements: Spouse/significant other Available Help at Discharge: Family;Available 24 hours/day Type of Home: House Home Access: Level entry     Home Layout: One level     Bathroom Shower/Tub: Occupational psychologist:  Standard     Home Equipment: Shower seat - built in;Hand held shower head          Prior Functioning/Environment Level of Independence: Independent                 OT Problem List: Decreased range of motion;Decreased activity tolerance;Impaired balance (sitting and/or  standing);Decreased cognition;Decreased safety awareness;Decreased knowledge of use of DME or AE;Decreased knowledge of precautions;Obesity;Pain      OT Treatment/Interventions: Self-care/ADL training;Energy conservation;DME and/or AE instruction;Therapeutic activities;Patient/family education;Balance training    OT Goals(Current goals can be found in the care plan section) Acute Rehab OT Goals Patient Stated Goal: get back to walking with husband around pond OT Goal Formulation: With patient Time For Goal Achievement: 04/20/20 Potential to Achieve Goals: Good ADL Goals Pt Will Perform Grooming: with supervision;standing Pt Will Perform Upper Body Dressing: with supervision;sitting Pt Will Perform Lower Body Dressing: with mod assist;with caregiver independent in assisting;sit to/from stand Pt Will Transfer to Toilet: with min assist;ambulating Pt Will Perform Toileting - Clothing Manipulation and hygiene: with min guard assist;sit to/from stand;with adaptive equipment;with caregiver independent in assisting Additional ADL Goal #1: Pt will perform bed mobility at min A level maintaining back precautions prior to engaging in ADL  OT Frequency: Min 3X/week   Barriers to D/C:            Co-evaluation              AM-PAC OT "6 Clicks" Daily Activity     Outcome Measure Help from another person eating meals?: None Help from another person taking care of personal grooming?: A Little Help from another person toileting, which includes using toliet, bedpan, or urinal?: A Lot Help from another person bathing (including washing, rinsing, drying)?: A Lot Help from another person to put on and taking off regular upper body clothing?: A Lot Help from another person to put on and taking off regular lower body clothing?: A Lot 6 Click Score: 15   End of Session Equipment Utilized During Treatment: Gait belt;Rolling walker;Back brace Nurse Communication: Mobility status;Patient requests pain  meds;Precautions (3 in 1 put over the toilet)  Activity Tolerance: Patient tolerated treatment well Patient left: in chair;with call bell/phone within reach  OT Visit Diagnosis: Unsteadiness on feet (R26.81);Other abnormalities of gait and mobility (R26.89);Muscle weakness (generalized) (M62.81);Pain Pain - Right/Left: Left Pain - part of body: Hip (lumbar spine)                Time: 0938-1829 OT Time Calculation (min): 40 min Charges:  OT General Charges $OT Visit: 1 Visit OT Evaluation $OT Eval Moderate Complexity: 1 Mod OT Treatments $Self Care/Home Management : 8-22 mins $Therapeutic Activity: 8-22 mins  Jesse Sans OTR/L Acute Rehabilitation Services Pager: [phone] Office: [place] 04/06/2020, 10:27 AM

## 2020-04-06 NOTE — Anesthesia Postprocedure Evaluation (Signed)
Anesthesia Post Note  Patient: OVETTA BAZZANO  Procedure(s) Performed: Lumbar one-two, Lumbar two-three, Lumbar three-four Posterior lumbar interbody fusion with pedicle screw fixation from Lumbar one to Lumbar four removal colflex (N/A Back)     Patient location during evaluation: PACU Anesthesia Type: General Level of consciousness: awake and alert and oriented Pain management: pain level controlled Vital Signs Assessment: post-procedure vital signs reviewed and stable Respiratory status: spontaneous breathing, nonlabored ventilation and respiratory function stable Cardiovascular status: blood pressure returned to baseline Postop Assessment: no apparent nausea or vomiting Anesthetic complications: no   No complications documented.        Brennan Bailey

## 2020-04-07 NOTE — TOC Initial Note (Addendum)
Transition of Care [facility]) - Initial/Assessment Note    Patient Details  Name: Karen Ramirez MRN: [id number] Date of Birth: September 03, 1950  [facility] CM/SW Contact:    Joanne Chars, LCSW Phone Number: 04/07/2020, 10:48 AM  Clinical Narrative:    CSW met with pt and husband to discuss SNF recommendation.  Permission given to speak with husband Karen Ramirez.  Pt does want SNF, choice document given, permission given to send out referral in hub,  pt particularly interested in [facility] as she is from [place].  Pt is vaccinated for covid and boosted.  No equipment currently in home.  PCP in place.   Pt sent out in hub for SNF.    1440: Pt received bed offer from [facility].              Expected Discharge Plan: Skilled Nursing Facility Barriers to Discharge: SNF Pending bed offer   Patient Goals and CMS Choice Patient states their goals for this hospitalization and ongoing recovery are:: "be able to get in and out of bed and my chair" CMS Medicare.gov Compare Post Acute Care list provided to:: Patient Choice offered to / list presented to : Patient  Expected Discharge Plan and Services Expected Discharge Plan: [place] In-house Referral: Clinical Social Work   Post Acute Care Choice: [facility] Living arrangements for the past 2 months: [place]                                      Prior Living Arrangements/Services Living arrangements for the past 2 months: Single Family Home Lives with:: Spouse Patient language and need for interpreter reviewed:: Yes Do you feel safe going back to the place where you live?: Yes      Need for Family Participation in Patient Care: Yes (Comment) Care giver support system in place?: Yes (comment) Current home services: Other (comment) (none) Criminal Activity/Legal Involvement Pertinent to Current Situation/Hospitalization: No - Comment as needed  Activities of Daily Living Home  Assistive Devices/Equipment: Eyeglasses,Hearing aid,Brace (specify type) (back brace) ADL Screening (condition at time of admission) Patient's cognitive ability adequate to safely complete daily activities?: No Is the patient deaf or have difficulty hearing?: Yes (wears hearing aids) Does the patient have difficulty seeing, even when wearing glasses/contacts?: No Does the patient have difficulty concentrating, remembering, or making decisions?: No Patient able to express need for assistance with ADLs?: Yes Does the patient have difficulty dressing or bathing?: No Independently performs ADLs?: Yes (appropriate for developmental age) Does the patient have difficulty walking or climbing stairs?: Yes Weakness of Legs: Right Weakness of Arms/Hands: None  Permission Sought/Granted Permission sought to share information with : Family Supports Permission granted to share information with : Yes, Verbal Permission Granted  Share Information with NAME: husband Karen Ramirez           Emotional Assessment Appearance:: Appears stated age Attitude/Demeanor/Rapport: Engaged Affect (typically observed): Appropriate,Pleasant Orientation: : Oriented to Self,Oriented to Place,Oriented to  Time,Oriented to Situation Alcohol / Substance Use: Not Applicable Psych Involvement: No (comment)  Admission diagnosis:  Spondylolisthesis of lumbar region [M43.16] Patient Active Problem List   Diagnosis Date Noted  . Spondylolisthesis of lumbar region 04/05/2020  . Lumbar stenosis with neurogenic claudication 04/12/2015   PCP:  Adin Hector, MD Pharmacy:   [id number] [address], [place] [address]  [place] [address] Phone: [phone] Fax: [phone]     Social Determinants of Health (SDOH) Interventions    Readmission Risk Interventions No flowsheet data found.

## 2020-04-07 NOTE — Progress Notes (Signed)
Physical Therapy Treatment Patient Details Name: Karen Ramirez MRN: [id number] DOB: Jan 24, 1950 Today's Date: 04/07/2020    History of Present Illness Pt is a 70 y/o admitted s/p L1-L4 PLIF on 04/05/2020. PMH includes DDD, Diverticulosis, Dysrhythmia, Hypothyroidism,  Osteoporosis, PMR, Shoulder arthroscopy with distal clavicle resection, bicepstenotomy, subacromial decompression (Right, 05/10/2014); Bilateral carpal tunnel release (2014).    PT Comments    Pt progressing slowly towards physical therapy goals. She continues to require significant +2 assist for transfers, but is able to ambulate fairly once up with gait deficits but no overt LOB or knee buckling this date. Discussed the recommendation for further skilled therapy follow-up at d/c to maximize functional independence and decrease burden of care on husband prior to return home. Will continue to follow.    Follow Up Recommendations  SNF;Supervision for mobility/OOB     Equipment Recommendations  Rolling walker with 5" wheels    Recommendations for Other Services       Precautions / Restrictions Precautions Precautions: Back;Fall Precaution Booklet Issued: Yes (comment) Precaution Comments: Pt was cued for precautions during functional mobility. Required Braces or Orthoses: Spinal Brace Spinal Brace: Lumbar corset;Applied in sitting position Restrictions Weight Bearing Restrictions: No    Mobility  Bed Mobility               General bed mobility comments: Pt was received sitting EOB with OT present.    Transfers Overall transfer level: Needs assistance Equipment used: Rolling walker (2 wheeled) Transfers: Sit to/from Stand Sit to Stand: Max assist;+2 physical assistance;+2 safety/equipment;Mod assist         General transfer comment: +2 max assist required from low bed height. +2 mod assist for power-up to full stand from raised surface. Assist required for controlled descent during  stand>sit.  Ambulation/Gait Ambulation/Gait assistance: Min assist;+2 safety/equipment Gait Distance (Feet): 200 Feet Assistive device: Rolling walker (2 wheeled) Gait Pattern/deviations: Step-through pattern;Decreased stride length;Trunk flexed;Narrow base of support Gait velocity: Decreased Gait velocity interpretation: <1.31 ft/sec, indicative of household ambulator General Gait Details: Very slow and guarded. Pt drifting to the L and required several cues to get back to middle of hallway. Occasional assist for balance and walker management, and +2 for chair follow. No obvious knee buckling.   Stairs             Wheelchair Mobility    Modified Rankin (Stroke Patients Only)       Balance Overall balance assessment: Needs assistance Sitting-balance support: Single extremity supported;Feet supported Sitting balance-Leahy Scale: Fair     Standing balance support: Single extremity supported;Bilateral upper extremity supported Standing balance-Leahy Scale: Poor Standing balance comment: dependent on UE support                            Cognition Arousal/Alertness: Awake/alert Behavior During Therapy: Flat affect Overall Cognitive Status: Impaired/Different from baseline Area of Impairment: Memory;Safety/judgement;Problem solving                     Memory: Decreased recall of precautions;Decreased short-term memory   Safety/Judgement: Decreased awareness of deficits   Problem Solving: Decreased initiation;Requires verbal cues;Requires tactile cues        Exercises      General Comments        Pertinent Vitals/Pain Pain Assessment: Faces Faces Pain Scale: Hurts little more Pain Location: L hip and back Pain Descriptors / Indicators: Discomfort;Grimacing;Sore;Constant;Aching Pain Intervention(s): Limited activity within patient's tolerance;Monitored during session;Repositioned  Home Living                      Prior  Function            PT Goals (current goals can now be found in the care plan section) Acute Rehab PT Goals Patient Stated Goal: get back to walking with husband around pond PT Goal Formulation: With patient/family Time For Goal Achievement: 04/13/20 Potential to Achieve Goals: Good Progress towards PT goals: Not progressing toward goals - comment    Frequency    Min 5X/week      PT Plan Discharge plan needs to be updated    Co-evaluation PT/OT/SLP Co-Evaluation/Treatment: Yes Reason for Co-Treatment: Necessary to address cognition/behavior during functional activity;For patient/therapist safety;To address functional/ADL transfers PT goals addressed during session: Balance;Proper use of DME;Mobility/safety with mobility        AM-PAC PT "6 Clicks" Mobility   Outcome Measure  Help needed turning from your back to your side while in a flat bed without using bedrails?: None Help needed moving from lying on your back to sitting on the side of a flat bed without using bedrails?: A Little Help needed moving to and from a bed to a chair (including a wheelchair)?: A Lot Help needed standing up from a chair using your arms (e.g., wheelchair or bedside chair)?: A Lot Help needed to walk in hospital room?: A Little Help needed climbing 3-5 steps with a railing? : A Lot 6 Click Score: 16    End of Session Equipment Utilized During Treatment: Gait belt;Back brace Activity Tolerance: Patient tolerated treatment well Patient left: in chair;with call bell/phone within reach;with family/visitor present Nurse Communication: Mobility status PT Visit Diagnosis: Unsteadiness on feet (R26.81);Pain Pain - part of body:  (back)     Time: 0488-8916 PT Time Calculation (min) (ACUTE ONLY): 21 min  Charges:  $Gait Training: 8-22 mins                     Karen Ramirez, PT, DPT Acute Rehabilitation Services Pager: [phone] Office: [phone]    Karen Ramirez 04/07/2020, 9:45  AM

## 2020-04-07 NOTE — NC FL2 (Signed)
[place] LEVEL OF CARE SCREENING TOOL     IDENTIFICATION  Patient Name: Karen Ramirez Birthdate: 02/02/1950 Sex: female Admission Date (Current Location): 04/05/2020  [facility] and [place] Number:  Herbalist and Address:  [facility] [employer] [facility], [place] [address] [place], [place] [address]      Provider Number: [id number]  Attending Physician Name and Address:  Kristeen Miss, MD  Relative Name and Phone Number:  Sen[place], Dillin [phone]  [phone]    Current Level of Care: Hospital Recommended Level of Care: [place] Prior Approval Number:    Date Approved/Denied:   PASRR Number: [id number]  Discharge Plan: SNF    Current Diagnoses: Patient Active Problem List   Diagnosis Date Noted  . Spondylolisthesis of lumbar region 04/05/2020  . Lumbar stenosis with neurogenic claudication 04/12/2015    Orientation RESPIRATION BLADDER Height & Weight     Self,Time,Situation,Place  Normal Continent Weight: 173 lb (78.5 kg) Height:  5\' 2"  (157.5 cm)  BEHAVIORAL SYMPTOMS/MOOD NEUROLOGICAL BOWEL NUTRITION STATUS      Continent Diet (Regular diet.  See discharge summary.)  AMBULATORY STATUS COMMUNICATION OF NEEDS Skin   Limited Assist Verbally Surgical wounds                       Personal Care Assistance Level of Assistance  Bathing,Feeding,Dressing Bathing Assistance: Maximum assistance Feeding assistance: Independent Dressing Assistance: Maximum assistance     Functional Limitations Info  Sight,Hearing,Speech Sight Info: Adequate Hearing Info: Adequate Speech Info: Adequate    SPECIAL CARE FACTORS FREQUENCY  PT (By licensed PT),OT (By licensed OT)     PT Frequency: 5x week OT Frequency: 5x week            Contractures Contractures Info: Not present    Additional Factors Info  Code Status,Allergies Code Status Info: full Allergies Info: Azithromycin, Levaquin (Levofloxacin),  Ultram (Tramadol)           Current Medications (04/07/2020):  This is the current hospital active medication list Current Facility-Administered Medications  Medication Dose Route Frequency Provider Last Rate Last Admin  . acetaminophen (TYLENOL) tablet 650 mg  650 mg Oral Q4H PRN Kristeen Miss, MD       Or  . acetaminophen (TYLENOL) suppository 650 mg  650 mg Rectal Q4H PRN Kristeen Miss, MD      . alum & mag hydroxide-simeth (MAALOX/MYLANTA) 200-200-20 MG/5ML suspension 30 mL  30 mL Oral Q6H PRN Kristeen Miss, MD      . bisacodyl (DULCOLAX) suppository 10 mg  10 mg Rectal Daily PRN Kristeen Miss, MD      . diazepam (VALIUM) tablet 2.5 mg  2.5 mg Oral Antoine Poche, MD   2.5 mg at 04/05/20 2021  . docusate sodium (COLACE) capsule 100 mg  100 mg Oral BID Kristeen Miss, MD   100 mg at 04/07/20 0825  . famotidine (PEPCID) tablet 20 mg  20 mg Oral BID Camillo Flaming, MD   20 mg at 04/07/20 5053  . HYDROcodone-acetaminophen (NORCO/VICODIN) 5-325 MG per tablet 1 tablet  1 tablet Oral QHS Kristeen Miss, MD      . HYDROcodone-acetaminophen (NORCO/VICODIN) 5-325 MG per tablet 1-2 tablet  1-2 tablet Oral Q4H PRN Kristeen Miss, MD   1 tablet at 04/07/20 [phone]  . lactated ringers infusion   Intravenous Continuous Kristeen Miss, MD      . levothyroxine (SYNTHROID) tablet 75 mcg  75 mcg Oral Q0600 Elsner,  Mallie Mussel, MD   75 mcg at 04/07/20 [phone]  . menthol-cetylpyridinium (CEPACOL) lozenge 3 mg  1 lozenge Oral PRN Kristeen Miss, MD       Or  . phenol (CHLORASEPTIC) mouth spray 1 spray  1 spray Mouth/Throat PRN Kristeen Miss, MD      . methocarbamol (ROBAXIN) tablet 500 mg  500 mg Oral Q6H PRN Kristeen Miss, MD   500 mg at 04/07/20 0015   Or  . methocarbamol (ROBAXIN) 500 mg in dextrose 5 % 50 mL IVPB  500 mg Intravenous Q6H PRN Kristeen Miss, MD      . morphine 2 MG/ML injection 2-4 mg  2-4 mg Intravenous Q2H PRN Kristeen Miss, MD      . ondansetron (ZOFRAN) tablet 4 mg  4 mg Oral Q6H PRN Kristeen Miss,  MD       Or  . ondansetron (ZOFRAN) injection 4 mg  4 mg Intravenous Q6H PRN Kristeen Miss, MD   4 mg at 04/05/20 2307  . polyethylene glycol (MIRALAX / GLYCOLAX) packet 17 g  17 g Oral Daily PRN Kristeen Miss, MD      . polyvinyl alcohol (LIQUIFILM TEARS) 1.4 % ophthalmic solution 1 drop  1 drop Both Eyes QHS Kristeen Miss, MD   1 drop at 04/06/20 2040  . predniSONE (DELTASONE) tablet 5 mg  5 mg Oral q AM Kristeen Miss, MD   5 mg at 04/07/20 8416  . pregabalin (LYRICA) capsule 75 mg  75 mg Oral TID Kristeen Miss, MD   75 mg at 04/07/20 0826  . senna (SENOKOT) tablet 8.6 mg  1 tablet Oral BID Kristeen Miss, MD   8.6 mg at 04/07/20 0826  . simvastatin (ZOCOR) tablet 20 mg  20 mg Oral Antoine Poche, MD   20 mg at 04/06/20 2038  . sodium chloride flush (NS) 0.9 % injection 3 mL  3 mL Intravenous Q12H Kristeen Miss, MD   3 mL at 04/06/20 2200  . sodium chloride flush (NS) 0.9 % injection 3 mL  3 mL Intravenous PRN Kristeen Miss, MD      . sodium phosphate (FLEET) 7-19 GM/118ML enema 1 enema  1 enema Rectal Once PRN Kristeen Miss, MD      . triamcinolone (NASACORT) nasal inhaler 1 spray  1 spray Nasal QHS Kristeen Miss, MD   1 spray at 04/06/20 2040     Discharge Medications: Please see discharge summary for a list of discharge medications.  Relevant Imaging Results:  Relevant Lab Results:   Additional Information SSN [id number].  Pt is vaccinated and boosted for covid.  Joanne Chars, LCSW

## 2020-04-07 NOTE — Progress Notes (Addendum)
Occupational Therapy Treatment Patient Details Name: Karen Ramirez MRN: [id number] DOB: [birth year] Today's Date: 04/07/2020    History of present illness Pt is a 70 y/o admitted s/p L1-L4 PLIF on 04/05/2020. PMH includes DDD, Diverticulosis, Dysrhythmia, Hypothyroidism,  Osteoporosis, PMR, Shoulder arthroscopy with distal clavicle resection, bicepstenotomy, subacromial decompression (Right, 05/10/2014); Bilateral carpal tunnel release (2014).   OT comments  Pt making gradual progress towards OT goals this session. Pt continues to present with increased pain, impaired balance, back precautions and generalized weakness impacting pts ability to complete BADLs independently. Pt currently requires significant +2 assist for functional mobility. Full education and demonstration provided on all LB AE for bathing and dressing with pt able to don sock with sock aid with MOD A, however pt very lethargic during demonstration, therefore review of AE maybe beneficial next OT session.  Pt currently requires MOD A for UB ADLs and up to MAX A+2 for ADL transfers with RW. D/t increased need for functional assist feel pt most appropriate for ST SNF placement, however if pt improves or declines SNF feel pt would need HHOT and DME listed below. Will continue to follow acutely per POC and update DC recs pending progress.   pt on 2L during session, briefly doffed O2 to check SpO2 on RA with sats reaching 85%, applied 2L during session with SpO2 96% and HR WFL post ambulation.   Follow Up Recommendations  SNF;Home health OT;Supervision/Assistance - 24 hour;Other (comment) (if transfers do not improve, will need SNF)    Equipment Recommendations  3 in 1 bedside commode;Other (comment) (potentially AE)    Recommendations for Other Services      Precautions / Restrictions Precautions Precautions: Back;Fall Precaution Booklet Issued: Yes (comment) Precaution Comments: pt able to state 3/3 precautions Required Braces  or Orthoses: Spinal Brace Spinal Brace: Lumbar corset;Applied in sitting position Restrictions Weight Bearing Restrictions: No       Mobility Bed Mobility Overal bed mobility: Needs Assistance Bed Mobility: Rolling;Sidelying to Sit Rolling: Min guard Sidelying to sit: Min assist       General bed mobility comments: min guard to roll into sidelying towards pts L side, light MIN A to elevate trunk into sitting. increased time and effort noted with pt needing step by step cues to sequence bed mobility    Transfers Overall transfer level: Needs assistance Equipment used: Rolling walker (2 wheeled) Transfers: Sit to/from Stand Sit to Stand: Max assist;+2 physical assistance;+2 safety/equipment;Mod assist         General transfer comment: +2 max assist required from low bed height. +2 mod assist for power-up to full stand from raised surface. Assist required for controlled descent during stand>sit.    Balance Overall balance assessment: Needs assistance Sitting-balance support: Single extremity supported;Feet supported Sitting balance-Leahy Scale: Fair Sitting balance - Comments: sitting EOB for LB ADLS with no LOB   Standing balance support: Bilateral upper extremity supported Standing balance-Leahy Scale: Poor Standing balance comment: dependent on UE support                           ADL either performed or assessed with clinical judgement   ADL Overall ADL's : Needs assistance/impaired               Lower Body Bathing Details (indicate cue type and reason): education provided on LB AE for LB bathing Upper Body Dressing : Moderate assistance;Sitting Upper Body Dressing Details (indicate cue type and reason): to don brace from  EOB Lower Body Dressing: Moderate assistance;With adaptive equipment;Sitting/lateral leans Lower Body Dressing Details (indicate cue type and reason): to don socks with sock aid, education/ demonstration provided on using reacher to  don pants from EOB Toilet Transfer: Moderate assistance;+2 for physical assistance;RW;Ambulation;Minimal assistance Toilet Transfer Details (indicate cue type and reason): simulated via functional mobiltiy with pt needing MOD A +2 to rise into standing, progressing to MIN A +2 as session progressed with chair follow   Toileting - Clothing Manipulation Details (indicate cue type and reason): education and demonstration provided on methods for completing posterior pericare including AE and lateral lean method   Tub/Shower Transfer Details (indicate cue type and reason): pt reports walkin shower with built in  seat Functional mobility during ADLs: Minimal assistance;Moderate assistance;+2 for physical assistance;+2 for safety/equipment;Rolling walker General ADL Comments: education and demo provided on all LB AE for bathing and dressing however pt quite lethargic during sesssion, pt may benefit from additonal session to review ADL items. pt currently requires significant +2 assist to rise from EOB and MIN A +2 for functional mobility. pt requires MOD A for LB ADL with AE and MOD A for UB ADLS from EOB     Vision       Perception     Praxis      Cognition Arousal/Alertness: Awake/alert;Lethargic;Suspect due to medications (moments of lethargy during education) Behavior During Therapy: Flat affect Overall Cognitive Status: Impaired/Different from baseline Area of Impairment: Safety/judgement;Problem solving                     Memory: Decreased recall of precautions;Decreased short-term memory   Safety/Judgement: Decreased awareness of deficits   Problem Solving: Decreased initiation;Requires verbal cues;Requires tactile cues General Comments: pt seems to be more aware of deficits this session in comparision to previous OT session now more agreeable to ST rehab. pt overall flat and slow to process but following one and two step commands with increased time.pt very lethargic during  AE education needing multimodal cues to stay awake, although likely d/t meds        Exercises     Shoulder Instructions       General Comments pt on 2L during session, briefly doffed O2 to check SpO2 on RA with sats reaching 85%, applied 2L during session with SpO2 96% and HR WFL post ambulation    Pertinent Vitals/ Pain       Pain Assessment: 0-10 Pain Score: 7  Faces Pain Scale: Hurts little more Pain Location: back Pain Descriptors / Indicators: Discomfort;Grimacing;Sore Pain Intervention(s): Limited activity within patient's tolerance;Monitored during session;Repositioned  Home Living                                          Prior Functioning/Environment              Frequency  Min 3X/week        Progress Toward Goals  OT Goals(current goals can now be found in the care plan section)  Progress towards OT goals: Progressing toward goals  Acute Rehab OT Goals Patient Stated Goal: none stated Time For Goal Achievement: 04/20/20 Potential to Achieve Goals: Good  Plan Discharge plan remains appropriate;Frequency remains appropriate    Co-evaluation      Reason for Co-Treatment: Necessary to address cognition/behavior during functional activity;For patient/therapist safety;To address functional/ADL transfers PT goals addressed during session: Balance;Proper use of DME;Mobility/safety  with mobility        AM-PAC OT "6 Clicks" Daily Activity     Outcome Measure   Help from another person eating meals?: None Help from another person taking care of personal grooming?: A Little Help from another person toileting, which includes using toliet, bedpan, or urinal?: A Lot Help from another person bathing (including washing, rinsing, drying)?: A Lot Help from another person to put on and taking off regular upper body clothing?: A Lot Help from another person to put on and taking off regular lower body clothing?: A Lot 6 Click Score: 15    End  of Session Equipment Utilized During Treatment: Oxygen;Other (comment);Gait belt;Rolling walker;Back brace (2L)  OT Visit Diagnosis: Unsteadiness on feet (R26.81);Other abnormalities of gait and mobility (R26.89);Muscle weakness (generalized) (M62.81);Pain Pain - part of body:  (back)   Activity Tolerance Patient tolerated treatment well   Patient Left in chair;with call bell/phone within reach   Nurse Communication Mobility status        Time: 0903-0149 OT Time Calculation (min): 40 min  Charges: OT Treatments $Self Care/Home Management : 23-37 mins  Harley Alto., COTA/L Acute Rehabilitation Services [place] 04/07/2020, 10:32 AM

## 2020-04-07 NOTE — Progress Notes (Signed)
   Providing Compassionate, Quality Care - Together  NEUROSURGERY PROGRESS NOTE   S: No issues overnight. Progressing slowly  O: EXAM:  BP 106/67 (BP Location: Right Arm)   Pulse 95   Temp 99.2 F (37.3 C) (Oral)   Resp 16   Ht 5\' 2"  (1.575 m)   Wt 78.5 kg   SpO2 97%   BMI 31.64 kg/m   Awake, alert, oriented  Speech fluent, appropriate  CNs grossly intact  5/5 BUE 4/5 BLE prox 4+/5 BLE distal Dressing c/d/i  ASSESSMENT:  70 y.o. female with  1. S/p L1-4 fusion for spondylosis  PLAN: - pt/ot Rehab pending Pain control Encourage progression Tolerating diet    Thank you for allowing me to participate in this patient's care.  Please do not hesitate to call with questions or concerns.   Elwin Sleight, [facility] Cell: [phone]

## 2020-04-08 MED ORDER — METHOCARBAMOL 500 MG PO TABS: 500.0000 mg | ORAL_TABLET | Freq: Four times a day (QID) | ORAL | 1 refills | Status: DC | PRN

## 2020-04-08 MED ORDER — SENNOSIDES-DOCUSATE SODIUM 8.6-50 MG PO TABS
1.0000 | ORAL_TABLET | Freq: Every evening | ORAL | Status: DC | PRN
  Administered 2020-04-08: 1 via ORAL
  Filled 2020-04-08: qty 1

## 2020-04-08 MED ORDER — HYDROCODONE-ACETAMINOPHEN 5-325 MG PO TABS: 1.0000 | ORAL_TABLET | ORAL | 0 refills | Status: DC | PRN

## 2020-04-08 MED FILL — Heparin Sodium (Porcine) Inj 1000 Unit/ML: INTRAMUSCULAR | Qty: 60 | Status: AC

## 2020-04-08 MED FILL — Sodium Chloride IV Soln 0.9%: INTRAVENOUS | Qty: 2000 | Status: AC

## 2020-04-08 MED FILL — Sodium Chloride Irrigation Soln 0.9%: Qty: 3000 | Status: AC

## 2020-04-08 NOTE — Progress Notes (Signed)
Physical Therapy Treatment Patient Details Name: Karen Ramirez MRN: [id number] DOB: 08/16/1950 Today's Date: 04/08/2020    History of Present Illness Pt is a 70 y/o admitted s/p L1-L4 PLIF on 04/05/2020. PMH includes DDD, Diverticulosis, Dysrhythmia, Hypothyroidism,  Osteoporosis, PMR, Shoulder arthroscopy with distal clavicle resection, bicepstenotomy, subacromial decompression (Right, 05/10/2014); Bilateral carpal tunnel release (2014).    PT Comments    Pt progressing well with post-op mobility. She was able to demonstrate transfers and ambulation with up to +2 max assist and RW for support. Pt reporting she feels her LLE was going to give out on her today however BLE's appeared to be buckling and knees were soft during stance even with multimodal cues to initiate TKE. Pt was educated on precautions, brace application/wearing schedule, appropriate activity progression, and car transfer. Will continue to follow.      Follow Up Recommendations  SNF;Supervision for mobility/OOB     Equipment Recommendations  Rolling walker with 5" wheels    Recommendations for Other Services       Precautions / Restrictions Precautions Precautions: Back;Fall Precaution Booklet Issued: Yes (comment) Precaution Comments: pt able to state 3/3 precautions Required Braces or Orthoses: Spinal Brace Spinal Brace: Lumbar corset;Applied in sitting position Restrictions Weight Bearing Restrictions: No    Mobility  Bed Mobility Overal bed mobility: Needs Assistance Bed Mobility: Rolling;Sit to Sidelying Rolling: Min guard       Sit to sidelying: Mod assist;+2 for physical assistance General bed mobility comments: Mod A to elevate BLEs back to bed duirng log roll and guide shoulders down to bed.    Transfers Overall transfer level: Needs assistance Equipment used: Rolling walker (2 wheeled) Transfers: Sit to/from Stand Sit to Stand: Max assist;+2 physical assistance;+2 safety/equipment;Mod  assist Stand pivot transfers: Min assist       General transfer comment: pt completed x3 sit<>stands from recliner with pt needing cues for hand placement each trial and MOD - MAX A+2 to power into standing, cues needed for pt to shift hips anteriorly and elevate trunk into standing.  Ambulation/Gait Ambulation/Gait assistance: Min assist;+2 safety/equipment Gait Distance (Feet): 40 Feet (x2) Assistive device: Rolling walker (2 wheeled) Gait Pattern/deviations: Step-through pattern;Decreased stride length;Trunk flexed;Narrow base of support Gait velocity: Decreased Gait velocity interpretation: <1.31 ft/sec, indicative of household ambulator General Gait Details: Very slow and guarded. Pt not able to tolerate distance longer than ~40' before requiring a seated rest break. Husband present to assist with chair follow so PT/OT could both keep hands on the patient during gait training.   Stairs             Wheelchair Mobility    Modified Rankin (Stroke Patients Only)       Balance Overall balance assessment: Needs assistance Sitting-balance support: Single extremity supported;Feet supported Sitting balance-Leahy Scale: Fair Sitting balance - Comments: sitting EOB for LB ADLS with no LOB   Standing balance support: Bilateral upper extremity supported Standing balance-Leahy Scale: Poor Standing balance comment: dependent on UE support and external assist                            Cognition Arousal/Alertness: Awake/alert Behavior During Therapy: Flat affect Overall Cognitive Status: Impaired/Different from baseline Area of Impairment: Problem solving;Memory                     Memory: Decreased short-term memory (requries each trial for hand placement during sit<>stand despite edcucation)   Safety/Judgement: Decreased awareness  of deficits   Problem Solving: Decreased initiation;Requires verbal cues;Requires tactile cues General Comments: pt seems  to be more aware of deficits this session in comparision to previous sessions and now more agreeable to ST rehab. pt overall flat and slow to process but following one and two step commands with increased time.pt very lethargic during AE education needing multimodal cues to stay awake, although likely d/t meds      Exercises      General Comments General comments (skin integrity, edema, etc.): pt on RA during session with SpO2 >96% during session. pts husband present during session      Pertinent Vitals/Pain Pain Assessment: Faces Pain Score: 10-Worst pain ever Faces Pain Scale: Hurts little more Pain Location: back / LLE Pain Descriptors / Indicators: Discomfort;Grimacing;Sore Pain Intervention(s): Monitored during session;Limited activity within patient's tolerance;Repositioned    Home Living Family/patient expects to be discharged to:: Private residence Living Arrangements: Spouse/significant other Available Help at Discharge: Family;Available 24 hours/day Type of Home: House Home Access: Level entry   Home Layout: One level Home Equipment: Shower seat - built in;Hand held shower head      Prior Function Level of Independence: Independent          PT Goals (current goals can now be found in the care plan section) Acute Rehab PT Goals Patient Stated Goal: to get better PT Goal Formulation: With patient/family Time For Goal Achievement: 04/13/20 Potential to Achieve Goals: Good Progress towards PT goals: Progressing toward goals    Frequency    Min 5X/week      PT Plan Current plan remains appropriate    Co-evaluation PT/OT/SLP Co-Evaluation/Treatment: Yes Reason for Co-Treatment: Necessary to address cognition/behavior during functional activity;For patient/therapist safety;To address functional/ADL transfers PT goals addressed during session: Mobility/safety with mobility;Balance;Proper use of DME OT goals addressed during session: ADL's and self-care       AM-PAC PT "6 Clicks" Mobility   Outcome Measure  Help needed turning from your back to your side while in a flat bed without using bedrails?: None Help needed moving from lying on your back to sitting on the side of a flat bed without using bedrails?: A Little Help needed moving to and from a bed to a chair (including a wheelchair)?: A Lot Help needed standing up from a chair using your arms (e.g., wheelchair or bedside chair)?: Total Help needed to walk in hospital room?: A Lot Help needed climbing 3-5 steps with a railing? : A Lot 6 Click Score: 14    End of Session Equipment Utilized During Treatment: Gait belt;Back brace Activity Tolerance: Patient limited by fatigue;Patient limited by pain Patient left: in chair;with call bell/phone within reach;with family/visitor present Nurse Communication: Mobility status PT Visit Diagnosis: Unsteadiness on feet (R26.81);Pain Pain - part of body:  (back)     Time: 1041-1101 PT Time Calculation (min) (ACUTE ONLY): 20 min  Charges:  $Gait Training: 8-22 mins                     Rolinda Roan, PT, DPT Acute Rehabilitation Services Pager: [phone] Office: [phone]    Thelma Comp 04/08/2020, 3:02 PM

## 2020-04-08 NOTE — Plan of Care (Signed)
Patient adequately ready for discharged to facility

## 2020-04-08 NOTE — Discharge Summary (Signed)
Physician Discharge Summary  Patient ID: Karen Ramirez MRN: [id number] DOB/AGE: 09/18/1950 70 y.o.  Admit date: 04/05/2020 Discharge date: 04/08/2020  Admission Diagnoses:  1.  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 female that presented for an L1-4 posterior lumbar interbody fusion by Dr. Ellene Route.  She tolerated the surgery well.  She was monitored postoperatively and evaluated by physical therapy and Occupational Therapy who recommended she go to a nursing facility for rehabilitation.  Postoperatively she had good motor strength, was tolerating normal diet.  Her pain was controlled on oral medication.  Her incision and dressing was clean dry and intact.  She had no new complaints, her previous radiculopathy was improved.   Treatments: Surgery -L1-4 open posterior lumbar interbody fusion and decompression  Discharge Exam: Blood pressure (!) 103/56, pulse 100, temperature 98.2 F (36.8 C), temperature source Oral, resp. rate 17, height 5\' 2"  (1.575 m), weight 78.5 kg, SpO2 94 %. Awake, alert, oriented Speech fluent, appropriate CN grossly intact 5/5 BUE 4+/5 BLE Wound c/d/i Abdomen soft  Disposition: Discharge disposition: 03-Skilled Nursing Facility       Discharge Instructions    Incentive spirometry RT   Complete by: As directed      Allergies as of 04/08/2020      Reactions   Azithromycin Diarrhea   Levaquin [levofloxacin] Nausea And Vomiting   Ultram [tramadol] Nausea And Vomiting      Medication List    STOP taking these medications   celecoxib 200 MG capsule Commonly known as: CELEBREX   folic acid 1 MG tablet Commonly known as: FOLVITE   IBUPROFEN COLD & SINUS PO     TAKE these medications   carboxymethylcellulose 0.5 % Soln Commonly known as: REFRESH PLUS Place 1 drop into both eyes at bedtime.    diazepam 5 MG tablet Commonly known as: VALIUM Take 2.5 mg by mouth at bedtime.   famotidine 20 MG tablet Commonly known as: PEPCID Take 20 mg by mouth 2 (two) times daily before a meal.   HYDROcodone-acetaminophen 5-325 MG tablet Commonly known as: NORCO/VICODIN Take 1-2 tablets by mouth every 4 (four) hours as needed for moderate pain. What changed:   how much to take  when to take this  reasons to take this   levothyroxine 75 MCG tablet Commonly known as: SYNTHROID Take 75 mcg by mouth daily before breakfast.   methocarbamol 500 MG tablet Commonly known as: ROBAXIN Take 1 tablet (500 mg total) by mouth every 6 (six) hours as needed for muscle spasms.   predniSONE 5 MG tablet Commonly known as: DELTASONE Take 5 mg by mouth in the morning.   pregabalin 75 MG capsule Commonly known as: LYRICA Take 75 mg by mouth 3 (three) times daily.   simvastatin 20 MG tablet Commonly known as: ZOCOR Take 20 mg by mouth at bedtime.   triamcinolone 55 MCG/ACT Aero nasal inhaler Commonly known as: NASACORT Place 1 spray into the nose at bedtime.   Vitamin D3 125 MCG (5000 UT) Tabs Take 5,000 Units by mouth every other day.       Contact information for follow-up providers    Kristeen Miss, MD Follow up in 3 week(s).   Specialty: Neurosurgery Contact information: [address] [place] [address] [phone]            Contact information for after-discharge  care    Destination    [facility] .   Service: Skilled Nursing Contact information: [address] [place] [phone]                  Signed: Theodoro Doing Oneil Behney 04/08/2020, 9:51 AM

## 2020-04-08 NOTE — Progress Notes (Signed)
Occupational Therapy Treatment Patient Details Name: Karen Ramirez MRN: [id number] DOB: 06/09/1950 Today's Date: 04/08/2020    History of present illness Pt is a 70 y/o admitted s/p L1-L4 PLIF on 04/05/2020. PMH includes DDD, Diverticulosis, Dysrhythmia, Hypothyroidism,  Osteoporosis, PMR, Shoulder arthroscopy with distal clavicle resection, bicepstenotomy, subacromial decompression (Right, 05/10/2014); Bilateral carpal tunnel release (2014).   OT comments  Pt making steady progress towards OT goals this session. Pt seen in conjunction with PT as pt with increased pain and weakness in LLE this session. Pt continues to present with decreased activity tolerance, increased pain, and generalized deconditioning. Pt currently requires MOD- MAX A +2 for sit<>stands from recliner and MIN- MOD A +2 for functional mobility with RW and chair follow. Pt with good carryover of AE from previous OT session as well as education related to back precautions. Pt with more awareness into deficits agreeable to ST SNF for rehab. Agree with ST SNF placement to facilitate functional independence in order to return to prior level of function. Will follow acutely per POC.    Follow Up Recommendations  SNF;Home health OT;Supervision/Assistance - 24 hour;Other (comment) (if transfers do not improve, will need SNF)    Equipment Recommendations  3 in 1 bedside commode;Other (comment) (potentially AE)    Recommendations for Other Services      Precautions / Restrictions Precautions Precautions: Back;Fall Precaution Booklet Issued: Yes (comment) Precaution Comments: pt able to state 3/3 precautions Required Braces or Orthoses: Spinal Brace Spinal Brace: Lumbar corset;Applied in sitting position Restrictions Weight Bearing Restrictions: No       Mobility Bed Mobility Overal bed mobility: Needs Assistance Bed Mobility: Rolling;Sit to Sidelying Rolling: Min guard       Sit to sidelying: Mod assist General bed  mobility comments: MOD A to elevate BLEs back to bed duirng log roll    Transfers Overall transfer level: Needs assistance Equipment used: Rolling walker (2 wheeled) Transfers: Sit to/from Stand Sit to Stand: Max assist;+2 physical assistance;+2 safety/equipment;Mod assist         General transfer comment: pt completed x3 sit<>stands from recliner with pt needing cues for hand placement each trial and MOD - MAX A+2 to power into standing, cues needed for pt to shift hips anteriorly and elevate trunk into standing.    Balance Overall balance assessment: Needs assistance Sitting-balance support: Single extremity supported;Feet supported Sitting balance-Leahy Scale: Fair     Standing balance support: Bilateral upper extremity supported Standing balance-Leahy Scale: Poor Standing balance comment: dependent on UE support and external assist                           ADL either performed or assessed with clinical judgement   ADL       Grooming: Set up;Sitting Grooming Details (indicate cue type and reason): pt seated in recliner donning make up with husband reporting she had set pt up for grooming tasks in chair                 Toilet Transfer: Moderate assistance;Maximal assistance;RW;Ambulation;+2 for physical assistance Toilet Transfer Details (indicate cue type and reason): simualted via functional mobility with pt needing MAX A+2 to stand and MOD A +2 for functional mobility with RW and chair follow         Functional mobility during ADLs: Moderate assistance;Maximal assistance;+2 for physical assistance;+2 for safety/equipment;Rolling walker General ADL Comments: pt with good carryover of back precautions and AE from last session, demo'ed use  of gait belt as leg lifter as pt with increased weakness in LLE this session. pt required increased assist for sit<>stand this session and presents with decreased activity tolerance needing more rest breaks      Vision       Perception     Praxis      Cognition Arousal/Alertness: Awake/alert Behavior During Therapy: Flat affect Overall Cognitive Status: Impaired/Different from baseline Area of Impairment: Problem solving;Memory                     Memory: Decreased short-term memory (requries each trial for hand placement during sit<>stand despite edcucation)       Problem Solving: Decreased initiation;Requires verbal cues;Requires tactile cues          Exercises     Shoulder Instructions       General Comments pt on RA during session with SpO2 >96% during session. pts husband present during session    Pertinent Vitals/ Pain       Pain Assessment: 0-10 Pain Score: 10-Worst pain ever Pain Location: back / LLE Pain Descriptors / Indicators: Discomfort;Grimacing;Sore Pain Intervention(s): Limited activity within patient's tolerance;Monitored during session;Repositioned;RN gave pain meds during session;Patient requesting pain meds-RN notified  Home Living                                          Prior Functioning/Environment              Frequency  Min 3X/week        Progress Toward Goals  OT Goals(current goals can now be found in the care plan section)  Progress towards OT goals: Progressing toward goals  Acute Rehab OT Goals Patient Stated Goal: to get better OT Goal Formulation: With patient/family Time For Goal Achievement: 04/20/20 Potential to Achieve Goals: Good  Plan Discharge plan remains appropriate;Frequency remains appropriate    Co-evaluation    PT/OT/SLP Co-Evaluation/Treatment: Yes Reason for Co-Treatment: For patient/therapist safety;To address functional/ADL transfers   OT goals addressed during session: ADL's and self-care      AM-PAC OT "6 Clicks" Daily Activity     Outcome Measure   Help from another person eating meals?: None Help from another person taking care of personal grooming?: A  Little Help from another person toileting, which includes using toliet, bedpan, or urinal?: A Lot Help from another person bathing (including washing, rinsing, drying)?: A Lot Help from another person to put on and taking off regular upper body clothing?: A Lot Help from another person to put on and taking off regular lower body clothing?: A Lot 6 Click Score: 15    End of Session Equipment Utilized During Treatment: Gait belt;Rolling walker;Back brace  OT Visit Diagnosis: Unsteadiness on feet (R26.81);Other abnormalities of gait and mobility (R26.89);Muscle weakness (generalized) (M62.81);Pain Pain - Right/Left: Left Pain - part of body: Leg (LLE and back)   Activity Tolerance Patient tolerated treatment well   Patient Left in bed;with call bell/phone within reach;with family/visitor present   Nurse Communication Mobility status;Other (comment);Patient requests pain meds        Time: 1033-1101 OT Time Calculation (min): 28 min  Charges: OT General Charges $OT Visit: 1 Visit OT Treatments $Self Care/Home Management : 8-22 mins Harley Alto., COTA/L Acute Rehabilitation Services [phone]    Precious Haws 04/08/2020, 2:18 PM

## 2020-04-08 NOTE — TOC Transition Note (Signed)
Transition of Care [facility]) - CM/SW Discharge Note   Patient Details  Name: Karen Ramirez MRN: [id number] Date of Birth: Mar 23, 1950  Transition of [facility]) CM/SW Contact:  Joanne Chars, LCSW Phone Number: 04/08/2020, 12:12 PM   Clinical Narrative:   Pt discharging to [facility] in [place], room 105.  RN call report to [phone].    Final next level of care: Skilled Nursing Facility Barriers to Discharge: Barriers Resolved   Patient Goals and CMS Choice Patient states their goals for this hospitalization and ongoing recovery are:: "be able to get in and out of bed and my chair" CMS Medicare.gov Compare Post Acute Care list provided to:: Patient Choice offered to / list presented to : Patient  Discharge Placement              Patient chooses bed at:  [facility]) Patient to be transferred to facility by: [employer] Name of family member notified: husband Annie Main Patient and family notified of of transfer: 04/08/20  Discharge Plan and Services In-house Referral: Clinical Social Work   Post Acute Care Choice: [place]                               Social Determinants of Health (SDOH) Interventions     Readmission Risk Interventions No flowsheet data found.

## 2020-04-08 NOTE — Discharge Instructions (Signed)
Wound Care Leave incision open to air. You may shower. Do not scrub directly on incision.  Do not put any creams, lotions, or ointments on incision. Activity Walk each and every day, increasing distance each day. No lifting greater than 5 lbs.  Avoid bending, arching, and twisting. No driving for 2 weeks; may ride as a passenger locally. If provided with back brace, wear when out of bed.  It is not necessary to wear in bed. Diet Resume your normal diet.  Return to Work Will be discussed at you follow up appointment. Call Your Doctor If Any of These Occur Redness, drainage, or swelling at the wound.  Temperature greater than 101 degrees. Severe pain not relieved by pain medication. Incision starts to come apart. Follow Up Appt Call today for appointment in 2-3 weeks ([phone]) or for problems.  If you have any hardware placed in your spine, you will need an x-ray before your appointment. 

## 2020-04-08 NOTE — Progress Notes (Signed)
Patient being discharged to [facility].  Patient to be transported by [facility].  IV removed with the catheter intact.  Discharge instructions and prescription information placed in the packet for discharge.  Report called to receiving Nurse at [facility]. Discharged instructions also given to patient's husband.

## 2020-04-11 DIAGNOSIS — M353 Polymyalgia rheumatica: Secondary | ICD-10-CM

## 2020-04-11 DIAGNOSIS — E039 Hypothyroidism, unspecified: Secondary | ICD-10-CM

## 2020-04-11 DIAGNOSIS — K219 Gastro-esophageal reflux disease without esophagitis: Secondary | ICD-10-CM

## 2020-04-11 DIAGNOSIS — M5136 Other intervertebral disc degeneration, lumbar region: Secondary | ICD-10-CM

## 2020-04-11 DIAGNOSIS — I872 Venous insufficiency (chronic) (peripheral): Secondary | ICD-10-CM

## 2020-04-14 DIAGNOSIS — I872 Venous insufficiency (chronic) (peripheral): Secondary | ICD-10-CM

## 2020-05-01 DIAGNOSIS — I82403 Acute embolism and thrombosis of unspecified deep veins of lower extremity, bilateral: Secondary | ICD-10-CM

## 2020-05-01 DIAGNOSIS — I2699 Other pulmonary embolism without acute cor pulmonale: Secondary | ICD-10-CM

## 2020-05-01 HISTORY — DX: Acute embolism and thrombosis of unspecified deep veins of lower extremity, bilateral: I82.403

## 2020-05-01 HISTORY — DX: Other pulmonary embolism without acute cor pulmonale: I26.99

## 2020-05-06 ENCOUNTER — Other Ambulatory Visit: Payer: Self-pay | Admitting: Neurological Surgery

## 2020-05-06 DIAGNOSIS — S32048A Other fracture of fourth lumbar vertebra, initial encounter for closed fracture: Secondary | ICD-10-CM

## 2020-05-09 ENCOUNTER — Other Ambulatory Visit: Payer: Self-pay

## 2020-05-09 ENCOUNTER — Ambulatory Visit
Admission: RE | Admit: 2020-05-09 | Discharge: 2020-05-09 | Disposition: A | Discharge status: Home / Self Care | Payer: Medicare Other | Source: Ambulatory Visit | Attending: Neurological Surgery | Admitting: Neurological Surgery

## 2020-05-09 DIAGNOSIS — S32048A Other fracture of fourth lumbar vertebra, initial encounter for closed fracture: Secondary | ICD-10-CM | POA: Diagnosis present

## 2020-05-10 ENCOUNTER — Other Ambulatory Visit: Payer: Self-pay | Admitting: Neurological Surgery

## 2020-05-11 NOTE — Progress Notes (Incomplete)
Surgical Instructions    Your procedure is scheduled on Saturday, May 14th, 2022  Report to [facility] Main Entrance "A" at 05:30 A.M., then check in with the Admitting office.  Call this number if you have problems the morning of surgery:  [phone]   If you have any questions prior to your surgery date call [phone]: Open Monday-Friday 8am-4pm    Remember:  Do not eat or drink after midnight the night before your surgery   Take these medicines the morning of surgery with A SIP OF WATER   famotidine (PEPCID) levothyroxine (SYNTHROID, LEVOTHROID) predniSONE (DELTASONE)  pregabalin (LYRICA)   If needed  HYDROcodone-acetaminophen (NORCO/VICODIN) methocarbamol (ROBAXIN)    As of today, STOP taking any Aspirin (unless otherwise instructed by your surgeon) Aleve, Naproxen, Ibuprofen, Motrin, Advil, Celebrex, Goody's, BC's, all herbal medications, fish oil, and all vitamins.                     Do not wear jewelry, make up, or nail polish            Do not wear lotions, powders, perfumes, or deodorant.            Do not shave 48 hours prior to surgery.             Do not bring valuables to the hospital.            [facility] is not responsible for any belongings or valuables.  Do NOT Smoke (Tobacco/Vaping) or drink Alcohol 24 hours prior to your procedure If you use a CPAP at night, you may bring all equipment for your overnight stay.   Contacts, glasses, dentures or bridgework may not be worn into surgery, please bring cases for these belongings   For patients admitted to the hospital, discharge time will be determined by your treatment team.   Patients discharged the day of surgery will not be allowed to drive home, and someone needs to stay with them for 24 hours.    Special instructions:   [facility]- Preparing For Surgery  Before surgery, you can play an important role. Because skin is not sterile, your skin needs to be as free of germs as possible. You can  reduce the number of germs on your skin by washing with CHG (chlorahexidine gluconate) Soap before surgery.  CHG is an antiseptic cleaner which kills germs and bonds with the skin to continue killing germs even after washing.    Oral Hygiene is also important to reduce your risk of infection.  Remember - BRUSH YOUR TEETH THE MORNING OF SURGERY WITH YOUR REGULAR TOOTHPASTE  Please do not use if you have an allergy to CHG or antibacterial soaps. If your skin becomes reddened/irritated stop using the CHG.  Do not shave (including legs and underarms) for at least 48 hours prior to first CHG shower. It is OK to shave your face.  Please follow these instructions carefully.   1. Shower the NIGHT BEFORE SURGERY and the MORNING OF SURGERY  2. If you chose to wash your hair, wash your hair first as usual with your normal shampoo.  3. After you shampoo, rinse your hair and body thoroughly to remove the shampoo.  4. Use CHG Soap as you would any other liquid soap. You can apply CHG directly to the skin and wash gently with a scrungie or a clean washcloth.   5. Apply the CHG Soap to your body ONLY FROM THE NECK DOWN.  Do  not use on open wounds or open sores. Avoid contact with your eyes, ears, mouth and genitals (private parts). Wash Face and genitals (private parts)  with your normal soap.   6. Wash thoroughly, paying special attention to the area where your surgery will be performed.  7. Thoroughly rinse your body with warm water from the neck down.  8. DO NOT shower/wash with your normal soap after using and rinsing off the CHG Soap.  9. Pat yourself dry with a CLEAN TOWEL.  10. Wear CLEAN PAJAMAS to bed the night before surgery  11. Place CLEAN SHEETS on your bed the night before your surgery  12. DO NOT SLEEP WITH PETS.   Day of Surgery: Take a shower with CHG soap. Wear Clean/Comfortable clothing the morning of surgery Do not apply any deodorants/lotions.   Remember to brush your teeth  WITH YOUR REGULAR TOOTHPASTE.   Please read over the following fact sheets that you were given.

## 2020-05-12 ENCOUNTER — Encounter (HOSPITAL_COMMUNITY)
Admission: RE | Admit: 2020-05-12 | Discharge: 2020-05-12 | Disposition: A | Discharge status: Home / Self Care | Payer: Medicare Other | Source: Ambulatory Visit | Attending: Neurological Surgery | Admitting: Neurological Surgery

## 2020-05-12 ENCOUNTER — Other Ambulatory Visit: Payer: Self-pay

## 2020-05-12 ENCOUNTER — Encounter (HOSPITAL_COMMUNITY): Payer: Self-pay

## 2020-05-12 DIAGNOSIS — Z20822 Contact with and (suspected) exposure to covid-19: Secondary | ICD-10-CM | POA: Insufficient documentation

## 2020-05-12 DIAGNOSIS — Z01812 Encounter for preprocedural laboratory examination: Secondary | ICD-10-CM | POA: Insufficient documentation

## 2020-05-12 HISTORY — DX: Personal history of urinary calculi: Z87.442

## 2020-05-12 LAB — SARS CORONAVIRUS 2 (TAT 6-24 HRS): SARS Coronavirus 2: NEGATIVE

## 2020-05-12 LAB — BASIC METABOLIC PANEL
Anion gap: 8 (ref 5–15)
BUN: 19 mg/dL (ref 8–23)
CO2: 27 mmol/L (ref 22–32)
Calcium: 9.5 mg/dL (ref 8.9–10.3)
Chloride: 104 mmol/L (ref 98–111)
Creatinine, Ser: 0.82 mg/dL (ref 0.44–1.00)
GFR, Estimated: >60 mL/min (ref >60)
Glucose, Bld: 139 mg/dL — ABNORMAL HIGH (ref 70–99)
Potassium: 4.2 mmol/L (ref 3.5–5.1)
Sodium: 139 mmol/L (ref 135–145)

## 2020-05-12 LAB — SURGICAL PCR SCREEN
MRSA, PCR: NEGATIVE
Staphylococcus aureus: POSITIVE — AB

## 2020-05-12 NOTE — Progress Notes (Signed)
Surgical Instructions    Your procedure is scheduled on Saturday, May 14th, 2022  Report to [facility] Emergency Department at 05:30 A.M., then check in with the Admitting office.  Call this number if you have problems the morning of surgery:  [phone]   If you have any questions prior to your surgery date call [phone]: Open Monday-Friday 8am-4pm   Remember:  Do not eat or drink after midnight the night before your surgery    Take these medicines the morning of surgery with A SIP OF WATER   famotidine (PEPCID) levothyroxine (SYNTHROID, LEVOTHROID)  predniSONE (DELTASONE) pregabalin (LYRICA)   If needed   HYDROcodone-acetaminophen (NORCO/VICODIN) methocarbamol (ROBAXIN)  As of today, STOP taking any Aspirin (unless otherwise instructed by your surgeon) Aleve, Naproxen, Celebrex, Ibuprofen, Motrin, Advil, Goody's, BC's, all herbal medications, fish oil, and all vitamins.                     Do not wear jewelry, make up, or nail polish            Do not wear lotions, powders, perfumes, or deodorant.            Do not shave 48 hours prior to surgery.             Do not bring valuables to the hospital.            [facility] is not responsible for any belongings or valuables.  Do NOT Smoke (Tobacco/Vaping) or drink Alcohol 24 hours prior to your procedure If you use a CPAP at night, you may bring all equipment for your overnight stay.   Contacts, glasses, dentures or bridgework may not be worn into surgery, please bring cases for these belongings   For patients admitted to the hospital, discharge time will be determined by your treatment team.   Patients discharged the day of surgery will not be allowed to drive home, and someone needs to stay with them for 24 hours.    Special instructions:   - Preparing For Surgery  Before surgery, you can play an important role. Because skin is not sterile, your skin needs to be as free of germs as possible. You can  reduce the number of germs on your skin by washing with CHG (chlorahexidine gluconate) Soap before surgery.  CHG is an antiseptic cleaner which kills germs and bonds with the skin to continue killing germs even after washing.    Oral Hygiene is also important to reduce your risk of infection.  Remember - BRUSH YOUR TEETH THE MORNING OF SURGERY WITH YOUR REGULAR TOOTHPASTE  Please do not use if you have an allergy to CHG or antibacterial soaps. If your skin becomes reddened/irritated stop using the CHG.  Do not shave (including legs and underarms) for at least 48 hours prior to first CHG shower. It is OK to shave your face.  Please follow these instructions carefully.   1. Shower the NIGHT BEFORE SURGERY and the MORNING OF SURGERY  2. If you chose to wash your hair, wash your hair first as usual with your normal shampoo.  3. After you shampoo, rinse your hair and body thoroughly to remove the shampoo.  4. Use CHG Soap as you would any other liquid soap. You can apply CHG directly to the skin and wash gently with a scrungie or a clean washcloth.   5. Apply the CHG Soap to your body ONLY FROM THE NECK DOWN.  Do not use  on open wounds or open sores. Avoid contact with your eyes, ears, mouth and genitals (private parts). Wash Face and genitals (private parts)  with your normal soap.   6. Wash thoroughly, paying special attention to the area where your surgery will be performed.  7. Thoroughly rinse your body with warm water from the neck down.  8. DO NOT shower/wash with your normal soap after using and rinsing off the CHG Soap.  9. Pat yourself dry with a CLEAN TOWEL.  10. Wear CLEAN PAJAMAS to bed the night before surgery  11. Place CLEAN SHEETS on your bed the night before your surgery  12. DO NOT SLEEP WITH PETS.   Day of Surgery: Take a shower with CHG soap. Wear Clean/Comfortable clothing the morning of surgery Do not apply any deodorants/lotions.   Remember to brush your teeth  WITH YOUR REGULAR TOOTHPASTE.   Please read over the following fact sheets that you were given.

## 2020-05-12 NOTE — Progress Notes (Signed)
PCP - Tama High III, MD Cardiologist - [facility]  PPM/ICD - denies Device Orders - N/A Rep Notified - N/A  Chest x-ray - N/A EKG - 04/05/2020 Stress Test - 12/11/2018 ECHO - denies Cardiac Cath - denies  Sleep Study - denies CPAP - N/A  Fasting Blood Sugar - N/A  Blood Thinner Instructions: N/A Aspirin Instructions: Patient was instructed: As of today, STOP taking any Aspirin (unless otherwise instructed by your surgeon) Aleve, Naproxen, Celebrex, Ibuprofen, Motrin, Advil, Goody's, BC's, all herbal medications, fish oil, and all vitamins.  ERAS Protcol - no  COVID TEST- 05/12/2020   Anesthesia review: yes; Hx of irregular heart beats  Patient denies shortness of breath, fever, cough and chest pain at PAT appointment   All instructions explained to the patient, with a verbal understanding of the material. Patient agrees to go over the instructions while at home for a better understanding. Patient also instructed to self quarantine after being tested for COVID-19. The opportunity to ask questions was provided.

## 2020-05-14 ENCOUNTER — Inpatient Hospital Stay (HOSPITAL_COMMUNITY): Payer: Medicare Other

## 2020-05-14 ENCOUNTER — Inpatient Hospital Stay (HOSPITAL_COMMUNITY): Payer: Medicare Other | Admitting: Physician Assistant

## 2020-05-14 ENCOUNTER — Other Ambulatory Visit: Payer: Self-pay

## 2020-05-14 ENCOUNTER — Inpatient Hospital Stay (HOSPITAL_COMMUNITY)
Admission: RE | Disposition: A | Discharge status: Skilled Nursing Facility | Payer: Self-pay | Source: Home / Self Care | Attending: Neurological Surgery

## 2020-05-14 ENCOUNTER — Encounter (HOSPITAL_COMMUNITY): Payer: Self-pay | Admitting: Neurological Surgery

## 2020-05-14 ENCOUNTER — Inpatient Hospital Stay (HOSPITAL_COMMUNITY)
Admission: RE | Admit: 2020-05-14 | Discharge: 2020-05-23 | DRG: 459 | Disposition: A | Discharge status: Skilled Nursing Facility | Payer: Medicare Other | Attending: Internal Medicine | Admitting: Internal Medicine

## 2020-05-14 DIAGNOSIS — E559 Vitamin D deficiency, unspecified: Secondary | ICD-10-CM | POA: Diagnosis present

## 2020-05-14 DIAGNOSIS — K219 Gastro-esophageal reflux disease without esophagitis: Secondary | ICD-10-CM | POA: Diagnosis present

## 2020-05-14 DIAGNOSIS — M48062 Spinal stenosis, lumbar region with neurogenic claudication: Secondary | ICD-10-CM | POA: Diagnosis present

## 2020-05-14 DIAGNOSIS — I2699 Other pulmonary embolism without acute cor pulmonale: Secondary | ICD-10-CM | POA: Diagnosis present

## 2020-05-14 DIAGNOSIS — M353 Polymyalgia rheumatica: Secondary | ICD-10-CM | POA: Diagnosis present

## 2020-05-14 DIAGNOSIS — Z79899 Other long term (current) drug therapy: Secondary | ICD-10-CM

## 2020-05-14 DIAGNOSIS — Z791 Long term (current) use of non-steroidal anti-inflammatories (NSAID): Secondary | ICD-10-CM

## 2020-05-14 DIAGNOSIS — Z881 Allergy status to other antibiotic agents status: Secondary | ICD-10-CM | POA: Diagnosis not present

## 2020-05-14 DIAGNOSIS — D62 Acute posthemorrhagic anemia: Secondary | ICD-10-CM | POA: Diagnosis not present

## 2020-05-14 DIAGNOSIS — M810 Age-related osteoporosis without current pathological fracture: Secondary | ICD-10-CM | POA: Diagnosis present

## 2020-05-14 DIAGNOSIS — I10 Essential (primary) hypertension: Secondary | ICD-10-CM | POA: Diagnosis not present

## 2020-05-14 DIAGNOSIS — I2721 Secondary pulmonary arterial hypertension: Secondary | ICD-10-CM | POA: Diagnosis present

## 2020-05-14 DIAGNOSIS — Z09 Encounter for follow-up examination after completed treatment for conditions other than malignant neoplasm: Secondary | ICD-10-CM | POA: Diagnosis not present

## 2020-05-14 DIAGNOSIS — I5031 Acute diastolic (congestive) heart failure: Secondary | ICD-10-CM | POA: Diagnosis not present

## 2020-05-14 DIAGNOSIS — R339 Retention of urine, unspecified: Secondary | ICD-10-CM | POA: Diagnosis not present

## 2020-05-14 DIAGNOSIS — Z90710 Acquired absence of both cervix and uterus: Secondary | ICD-10-CM | POA: Diagnosis not present

## 2020-05-14 DIAGNOSIS — Z20822 Contact with and (suspected) exposure to covid-19: Secondary | ICD-10-CM | POA: Diagnosis not present

## 2020-05-14 DIAGNOSIS — Z87442 Personal history of urinary calculi: Secondary | ICD-10-CM

## 2020-05-14 DIAGNOSIS — E785 Hyperlipidemia, unspecified: Secondary | ICD-10-CM | POA: Diagnosis present

## 2020-05-14 DIAGNOSIS — M5116 Intervertebral disc disorders with radiculopathy, lumbar region: Secondary | ICD-10-CM | POA: Diagnosis present

## 2020-05-14 DIAGNOSIS — E7800 Pure hypercholesterolemia, unspecified: Secondary | ICD-10-CM | POA: Diagnosis present

## 2020-05-14 DIAGNOSIS — E039 Hypothyroidism, unspecified: Secondary | ICD-10-CM | POA: Diagnosis not present

## 2020-05-14 DIAGNOSIS — I82401 Acute embolism and thrombosis of unspecified deep veins of right lower extremity: Secondary | ICD-10-CM | POA: Diagnosis present

## 2020-05-14 DIAGNOSIS — R7989 Other specified abnormal findings of blood chemistry: Secondary | ICD-10-CM | POA: Diagnosis not present

## 2020-05-14 DIAGNOSIS — Z79890 Hormone replacement therapy (postmenopausal): Secondary | ICD-10-CM

## 2020-05-14 DIAGNOSIS — J9601 Acute respiratory failure with hypoxia: Secondary | ICD-10-CM | POA: Diagnosis not present

## 2020-05-14 DIAGNOSIS — I82491 Acute embolism and thrombosis of other specified deep vein of right lower extremity: Secondary | ICD-10-CM | POA: Diagnosis not present

## 2020-05-14 DIAGNOSIS — K5900 Constipation, unspecified: Secondary | ICD-10-CM | POA: Diagnosis not present

## 2020-05-14 DIAGNOSIS — L8990 Pressure ulcer of unspecified site, unspecified stage: Secondary | ICD-10-CM | POA: Diagnosis present

## 2020-05-14 DIAGNOSIS — Z7952 Long term (current) use of systemic steroids: Secondary | ICD-10-CM

## 2020-05-14 DIAGNOSIS — M4846XA Fatigue fracture of vertebra, lumbar region, initial encounter for fracture: Secondary | ICD-10-CM | POA: Diagnosis not present

## 2020-05-14 DIAGNOSIS — M4846XS Fatigue fracture of vertebra, lumbar region, sequela of fracture: Secondary | ICD-10-CM | POA: Diagnosis not present

## 2020-05-14 DIAGNOSIS — E876 Hypokalemia: Secondary | ICD-10-CM | POA: Diagnosis not present

## 2020-05-14 DIAGNOSIS — M7989 Other specified soft tissue disorders: Secondary | ICD-10-CM | POA: Diagnosis not present

## 2020-05-14 DIAGNOSIS — R0602 Shortness of breath: Secondary | ICD-10-CM

## 2020-05-14 DIAGNOSIS — Z419 Encounter for procedure for purposes other than remedying health state, unspecified: Secondary | ICD-10-CM | POA: Diagnosis not present

## 2020-05-14 DIAGNOSIS — M549 Dorsalgia, unspecified: Secondary | ICD-10-CM | POA: Diagnosis present

## 2020-05-14 DIAGNOSIS — Z885 Allergy status to narcotic agent status: Secondary | ICD-10-CM

## 2020-05-14 HISTORY — PX: POSTERIOR LUMBAR FUSION 4 LEVEL: SHX6037

## 2020-05-14 LAB — CBC
HCT: 40.1 % (ref 36.0–46.0)
Hemoglobin: 12.2 g/dL (ref 12.0–15.0)
MCH: 28.8 pg (ref 26.0–34.0)
MCHC: 30.4 g/dL (ref 30.0–36.0)
MCV: 94.8 fL (ref 80.0–100.0)
Platelets: 268 10*3/uL (ref 150–400)
RBC: 4.23 MIL/uL (ref 3.87–5.11)
RDW: 14.9 % (ref 11.5–15.5)
WBC: 11 10*3/uL — ABNORMAL HIGH (ref 4.0–10.5)
nRBC: 0 % (ref 0.0–0.2)

## 2020-05-14 SURGERY — POSTERIOR LUMBAR FUSION 4 LEVEL
Anesthesia: General | Site: Back

## 2020-05-14 MED ORDER — KETAMINE HCL 50 MG/5ML IJ SOSY
PREFILLED_SYRINGE | INTRAMUSCULAR | Status: AC
  Filled 2020-05-14: qty 5

## 2020-05-14 MED ORDER — LEVOTHYROXINE SODIUM 75 MCG PO TABS
75.0000 ug | ORAL_TABLET | Freq: Every day | ORAL | Status: DC
  Administered 2020-05-15 – 2020-05-23 (×9): 75 ug via ORAL
  Filled 2020-05-14 (×9): qty 1

## 2020-05-14 MED ORDER — ROCURONIUM BROMIDE 10 MG/ML (PF) SYRINGE
PREFILLED_SYRINGE | INTRAVENOUS | Status: DC | PRN
  Administered 2020-05-14 (×3): 10 mg via INTRAVENOUS
  Administered 2020-05-14: 60 mg via INTRAVENOUS
  Administered 2020-05-14 (×4): 10 mg via INTRAVENOUS

## 2020-05-14 MED ORDER — CEFAZOLIN SODIUM 1 G IJ SOLR
INTRAMUSCULAR | Status: AC
  Filled 2020-05-14: qty 20

## 2020-05-14 MED ORDER — SODIUM CHLORIDE 0.9% FLUSH: 3.0000 mL | INTRAVENOUS | Status: DC | PRN

## 2020-05-14 MED ORDER — MENTHOL 3 MG MT LOZG: 1.0000 | LOZENGE | OROMUCOSAL | Status: DC | PRN

## 2020-05-14 MED ORDER — SODIUM CHLORIDE 0.9 % IV SOLN
250.0000 mL | INTRAVENOUS | Status: DC
  Administered 2020-05-14: 250 mL via INTRAVENOUS

## 2020-05-14 MED ORDER — CEFAZOLIN SODIUM-DEXTROSE 2-4 GM/100ML-% IV SOLN
2.0000 g | Freq: Three times a day (TID) | INTRAVENOUS | Status: AC
  Administered 2020-05-14 – 2020-05-15 (×2): 2 g via INTRAVENOUS
  Filled 2020-05-14 (×3): qty 100

## 2020-05-14 MED ORDER — ACETAMINOPHEN 325 MG PO TABS
650.0000 mg | ORAL_TABLET | ORAL | Status: DC | PRN
  Administered 2020-05-23: 650 mg via ORAL
  Filled 2020-05-14: qty 2

## 2020-05-14 MED ORDER — ALBUMIN HUMAN 5 % IV SOLN: INTRAVENOUS | Status: DC | PRN

## 2020-05-14 MED ORDER — FAMOTIDINE 20 MG PO TABS
20.0000 mg | ORAL_TABLET | Freq: Two times a day (BID) | ORAL | Status: DC
  Administered 2020-05-14 – 2020-05-23 (×18): 20 mg via ORAL
  Filled 2020-05-14 (×21): qty 1

## 2020-05-14 MED ORDER — DEXAMETHASONE SODIUM PHOSPHATE 10 MG/ML IJ SOLN
INTRAMUSCULAR | Status: AC
  Filled 2020-05-14: qty 1

## 2020-05-14 MED ORDER — LIDOCAINE 2% (20 MG/ML) 5 ML SYRINGE
INTRAMUSCULAR | Status: AC
  Filled 2020-05-14: qty 5

## 2020-05-14 MED ORDER — ACETAMINOPHEN 650 MG RE SUPP: 650.0000 mg | RECTAL | Status: DC | PRN

## 2020-05-14 MED ORDER — PREGABALIN 75 MG PO CAPS
75.0000 mg | ORAL_CAPSULE | Freq: Three times a day (TID) | ORAL | Status: DC
  Administered 2020-05-14 – 2020-05-23 (×26): 75 mg via ORAL
  Filled 2020-05-14 (×25): qty 1

## 2020-05-14 MED ORDER — FOLIC ACID 1 MG PO TABS
2.0000 mg | ORAL_TABLET | Freq: Every day | ORAL | Status: DC
  Administered 2020-05-14 – 2020-05-23 (×9): 2 mg via ORAL
  Filled 2020-05-14 (×10): qty 2

## 2020-05-14 MED ORDER — THROMBIN 20000 UNITS EX SOLR
CUTANEOUS | Status: AC
  Filled 2020-05-14: qty 20000

## 2020-05-14 MED ORDER — KETAMINE HCL 10 MG/ML IJ SOLN
INTRAMUSCULAR | Status: DC | PRN
  Administered 2020-05-14: 20 mg via INTRAVENOUS
  Administered 2020-05-14: 15 mg via INTRAVENOUS

## 2020-05-14 MED ORDER — LIDOCAINE-EPINEPHRINE 1 %-1:100000 IJ SOLN
INTRAMUSCULAR | Status: AC
  Filled 2020-05-14: qty 1

## 2020-05-14 MED ORDER — CHLORHEXIDINE GLUCONATE 0.12 % MT SOLN
15.0000 mL | Freq: Once | OROMUCOSAL | Status: AC
  Administered 2020-05-14: 15 mL via OROMUCOSAL
  Filled 2020-05-14: qty 15

## 2020-05-14 MED ORDER — ONDANSETRON HCL 4 MG/2ML IJ SOLN
INTRAMUSCULAR | Status: DC | PRN
  Administered 2020-05-14: 4 mg via INTRAVENOUS

## 2020-05-14 MED ORDER — OXYCODONE HCL 5 MG PO TABS
5.0000 mg | ORAL_TABLET | Freq: Once | ORAL | Status: DC | PRN
Start: 2020-05-14 — End: 2020-05-14

## 2020-05-14 MED ORDER — MIDAZOLAM HCL 2 MG/2ML IJ SOLN
INTRAMUSCULAR | Status: AC
  Filled 2020-05-14: qty 2

## 2020-05-14 MED ORDER — LIDOCAINE-EPINEPHRINE 1 %-1:100000 IJ SOLN
INTRAMUSCULAR | Status: DC | PRN
  Administered 2020-05-14: 10 mL
  Administered 2020-05-14: 5 mL

## 2020-05-14 MED ORDER — LACTATED RINGERS IV SOLN: INTRAVENOUS | Status: DC

## 2020-05-14 MED ORDER — DIAZEPAM 5 MG PO TABS
2.5000 mg | ORAL_TABLET | Freq: Every day | ORAL | Status: DC
  Administered 2020-05-14 – 2020-05-22 (×9): 2.5 mg via ORAL
  Filled 2020-05-14 (×9): qty 1

## 2020-05-14 MED ORDER — PHENYLEPHRINE 40 MCG/ML (10ML) SYRINGE FOR IV PUSH (FOR BLOOD PRESSURE SUPPORT)
PREFILLED_SYRINGE | INTRAVENOUS | Status: DC | PRN
  Administered 2020-05-14: 120 ug via INTRAVENOUS
  Administered 2020-05-14 (×2): 80 ug via INTRAVENOUS

## 2020-05-14 MED ORDER — ROCURONIUM BROMIDE 10 MG/ML (PF) SYRINGE
PREFILLED_SYRINGE | INTRAVENOUS | Status: AC
  Filled 2020-05-14: qty 10

## 2020-05-14 MED ORDER — PROPOFOL 10 MG/ML IV BOLUS
INTRAVENOUS | Status: DC | PRN
  Administered 2020-05-14: 110 mg via INTRAVENOUS

## 2020-05-14 MED ORDER — METHOCARBAMOL 1000 MG/10ML IJ SOLN
500.0000 mg | Freq: Four times a day (QID) | INTRAVENOUS | Status: DC | PRN
  Filled 2020-05-14 (×2): qty 5

## 2020-05-14 MED ORDER — DIPHENHYDRAMINE HCL 50 MG/ML IJ SOLN
INTRAMUSCULAR | Status: DC | PRN
  Administered 2020-05-14: 10 mg via INTRAVENOUS

## 2020-05-14 MED ORDER — PROPOFOL 10 MG/ML IV BOLUS
INTRAVENOUS | Status: AC
  Filled 2020-05-14: qty 20

## 2020-05-14 MED ORDER — FLEET ENEMA 7-19 GM/118ML RE ENEM: 1.0000 | ENEMA | Freq: Once | RECTAL | Status: DC | PRN

## 2020-05-14 MED ORDER — ONDANSETRON HCL 4 MG/2ML IJ SOLN
4.0000 mg | Freq: Four times a day (QID) | INTRAMUSCULAR | Status: DC | PRN
  Administered 2020-05-21: 4 mg via INTRAVENOUS

## 2020-05-14 MED ORDER — TRIAMCINOLONE ACETONIDE 55 MCG/ACT NA AERO
1.0000 | INHALATION_SPRAY | Freq: Every day | NASAL | Status: DC
Start: 2020-05-14 — End: 2020-05-23
  Administered 2020-05-14 – 2020-05-22 (×9): 1 via NASAL
  Filled 2020-05-14: qty 10.8

## 2020-05-14 MED ORDER — ONDANSETRON HCL 4 MG/2ML IJ SOLN
INTRAMUSCULAR | Status: AC
  Filled 2020-05-14: qty 2

## 2020-05-14 MED ORDER — THROMBIN 5000 UNITS EX SOLR
CUTANEOUS | Status: AC
  Filled 2020-05-14: qty 5000

## 2020-05-14 MED ORDER — POLYETHYLENE GLYCOL 3350 17 G PO PACK
17.0000 g | PACK | Freq: Every day | ORAL | Status: DC | PRN
  Administered 2020-05-16 – 2020-05-22 (×2): 17 g via ORAL
  Filled 2020-05-14: qty 1

## 2020-05-14 MED ORDER — ONDANSETRON HCL 4 MG PO TABS: 4.0000 mg | ORAL_TABLET | Freq: Four times a day (QID) | ORAL | Status: DC | PRN

## 2020-05-14 MED ORDER — MORPHINE SULFATE (PF) 2 MG/ML IV SOLN
2.0000 mg | INTRAVENOUS | Status: DC | PRN
  Administered 2020-05-14 – 2020-05-21 (×3): 2 mg via INTRAVENOUS
  Filled 2020-05-14 (×3): qty 1

## 2020-05-14 MED ORDER — BUPIVACAINE HCL (PF) 0.5 % IJ SOLN
INTRAMUSCULAR | Status: DC | PRN
  Administered 2020-05-14: 10 mL
  Administered 2020-05-14: 5 mL
  Administered 2020-05-14: 15 mL

## 2020-05-14 MED ORDER — MIDAZOLAM HCL 5 MG/5ML IJ SOLN
INTRAMUSCULAR | Status: DC | PRN
  Administered 2020-05-14: 2 mg via INTRAVENOUS

## 2020-05-14 MED ORDER — SENNA 8.6 MG PO TABS
1.0000 | ORAL_TABLET | Freq: Two times a day (BID) | ORAL | Status: DC
  Administered 2020-05-16 – 2020-05-23 (×8): 8.6 mg via ORAL
  Filled 2020-05-14 (×15): qty 1

## 2020-05-14 MED ORDER — 0.9 % SODIUM CHLORIDE (POUR BTL) OPTIME
TOPICAL | Status: DC | PRN
  Administered 2020-05-14: 1000 mL

## 2020-05-14 MED ORDER — PHENYLEPHRINE HCL (PRESSORS) 10 MG/ML IV SOLN
INTRAVENOUS | Status: AC
  Filled 2020-05-14: qty 1

## 2020-05-14 MED ORDER — METHOCARBAMOL 500 MG PO TABS
500.0000 mg | ORAL_TABLET | Freq: Four times a day (QID) | ORAL | Status: DC | PRN
  Administered 2020-05-15 – 2020-05-23 (×5): 500 mg via ORAL
  Filled 2020-05-14 (×5): qty 1

## 2020-05-14 MED ORDER — BUPIVACAINE HCL (PF) 0.5 % IJ SOLN
INTRAMUSCULAR | Status: AC
  Filled 2020-05-14: qty 30

## 2020-05-14 MED ORDER — DEXAMETHASONE 2 MG PO TABS
2.0000 mg | ORAL_TABLET | Freq: Two times a day (BID) | ORAL | Status: DC
  Administered 2020-05-14 – 2020-05-23 (×17): 2 mg via ORAL
  Filled 2020-05-14 (×17): qty 1

## 2020-05-14 MED ORDER — VITAMIN D 25 MCG (1000 UNIT) PO TABS
5000.0000 [IU] | ORAL_TABLET | ORAL | Status: DC
  Administered 2020-05-14 – 2020-05-22 (×5): 5000 [IU] via ORAL
  Filled 2020-05-14 (×9): qty 5

## 2020-05-14 MED ORDER — SODIUM CHLORIDE (PF) 0.9 % IJ SOLN
INTRAMUSCULAR | Status: AC
  Filled 2020-05-14: qty 10

## 2020-05-14 MED ORDER — OXYCODONE HCL 5 MG/5ML PO SOLN: 5.0000 mg | Freq: Once | ORAL | Status: DC | PRN

## 2020-05-14 MED ORDER — LIDOCAINE 2% (20 MG/ML) 5 ML SYRINGE
INTRAMUSCULAR | Status: DC | PRN
  Administered 2020-05-14: 50 mg via INTRAVENOUS

## 2020-05-14 MED ORDER — DOCUSATE SODIUM 100 MG PO CAPS
100.0000 mg | ORAL_CAPSULE | Freq: Two times a day (BID) | ORAL | Status: DC
  Administered 2020-05-16 – 2020-05-23 (×12): 100 mg via ORAL
  Filled 2020-05-14 (×16): qty 1

## 2020-05-14 MED ORDER — FENTANYL CITRATE (PF) 100 MCG/2ML IJ SOLN: 25.0000 ug | INTRAMUSCULAR | Status: DC | PRN

## 2020-05-14 MED ORDER — HYDROCODONE-ACETAMINOPHEN 5-325 MG PO TABS
1.0000 | ORAL_TABLET | ORAL | Status: DC | PRN
Start: 2020-05-14 — End: 2020-05-23
  Administered 2020-05-14 – 2020-05-15 (×4): 2 via ORAL
  Administered 2020-05-15: 1 via ORAL
  Administered 2020-05-15 – 2020-05-17 (×6): 2 via ORAL
  Administered 2020-05-17: 1 via ORAL
  Administered 2020-05-18 – 2020-05-19 (×7): 2 via ORAL
  Administered 2020-05-19: 1 via ORAL
  Administered 2020-05-20 – 2020-05-21 (×6): 2 via ORAL
  Administered 2020-05-22 – 2020-05-23 (×5): 1 via ORAL
  Filled 2020-05-14 (×5): qty 2
  Filled 2020-05-14: qty 1
  Filled 2020-05-14 (×2): qty 2
  Filled 2020-05-14: qty 1
  Filled 2020-05-14: qty 2
  Filled 2020-05-14: qty 1
  Filled 2020-05-14 (×2): qty 2
  Filled 2020-05-14 (×2): qty 1
  Filled 2020-05-14 (×7): qty 2
  Filled 2020-05-14: qty 1
  Filled 2020-05-14: qty 2
  Filled 2020-05-14: qty 1
  Filled 2020-05-14: qty 2
  Filled 2020-05-14 (×2): qty 1
  Filled 2020-05-14: qty 2
  Filled 2020-05-14: qty 1
  Filled 2020-05-14 (×3): qty 2
  Filled 2020-05-14: qty 1

## 2020-05-14 MED ORDER — ORAL CARE MOUTH RINSE: 15.0000 mL | Freq: Once | OROMUCOSAL | Status: AC

## 2020-05-14 MED ORDER — PHENOL 1.4 % MT LIQD: 1.0000 | OROMUCOSAL | Status: DC | PRN

## 2020-05-14 MED ORDER — DIPHENHYDRAMINE HCL 50 MG/ML IJ SOLN
INTRAMUSCULAR | Status: AC
  Filled 2020-05-14: qty 1

## 2020-05-14 MED ORDER — PHENYLEPHRINE HCL-NACL 10-0.9 MG/250ML-% IV SOLN
INTRAVENOUS | Status: DC | PRN
  Administered 2020-05-14: 25 ug/min via INTRAVENOUS

## 2020-05-14 MED ORDER — PREDNISONE 5 MG PO TABS: 10.0000 mg | ORAL_TABLET | Freq: Every morning | ORAL | Status: DC

## 2020-05-14 MED ORDER — SUGAMMADEX SODIUM 200 MG/2ML IV SOLN
INTRAVENOUS | Status: DC | PRN
  Administered 2020-05-14: 200 mg via INTRAVENOUS

## 2020-05-14 MED ORDER — DEXAMETHASONE SODIUM PHOSPHATE 10 MG/ML IJ SOLN
INTRAMUSCULAR | Status: DC | PRN
  Administered 2020-05-14: 10 mg via INTRAVENOUS

## 2020-05-14 MED ORDER — SUFENTANIL CITRATE 50 MCG/ML IV SOLN
INTRAVENOUS | Status: AC
  Filled 2020-05-14: qty 1

## 2020-05-14 MED ORDER — ONDANSETRON HCL 4 MG/2ML IJ SOLN: 4.0000 mg | Freq: Four times a day (QID) | INTRAMUSCULAR | Status: DC | PRN

## 2020-05-14 MED ORDER — POLYVINYL ALCOHOL 1.4 % OP SOLN
1.0000 [drp] | Freq: Every day | OPHTHALMIC | Status: DC
  Administered 2020-05-14 – 2020-05-22 (×9): 1 [drp] via OPHTHALMIC
  Filled 2020-05-14: qty 15

## 2020-05-14 MED ORDER — THROMBIN 5000 UNITS EX SOLR: OROMUCOSAL | Status: DC | PRN

## 2020-05-14 MED ORDER — SODIUM CHLORIDE 0.9% FLUSH
3.0000 mL | Freq: Two times a day (BID) | INTRAVENOUS | Status: DC
  Administered 2020-05-14 – 2020-05-23 (×17): 3 mL via INTRAVENOUS

## 2020-05-14 MED ORDER — METHOCARBAMOL 500 MG PO TABS: 500.0000 mg | ORAL_TABLET | Freq: Four times a day (QID) | ORAL | Status: DC | PRN

## 2020-05-14 MED ORDER — CEFAZOLIN SODIUM-DEXTROSE 2-4 GM/100ML-% IV SOLN
2.0000 g | Freq: Once | INTRAVENOUS | Status: AC
  Administered 2020-05-14 (×2): 2 g via INTRAVENOUS

## 2020-05-14 MED ORDER — CEFAZOLIN SODIUM-DEXTROSE 2-4 GM/100ML-% IV SOLN
INTRAVENOUS | Status: AC
  Filled 2020-05-14: qty 100

## 2020-05-14 MED ORDER — THROMBIN 20000 UNITS EX SOLR: CUTANEOUS | Status: DC | PRN

## 2020-05-14 MED ORDER — BISACODYL 10 MG RE SUPP
10.0000 mg | Freq: Every day | RECTAL | Status: DC | PRN
  Administered 2020-05-22: 10 mg via RECTAL
  Filled 2020-05-14: qty 1

## 2020-05-14 MED ORDER — KETOROLAC TROMETHAMINE 15 MG/ML IJ SOLN
7.5000 mg | Freq: Four times a day (QID) | INTRAMUSCULAR | Status: AC
  Administered 2020-05-14 – 2020-05-15 (×2): 7.5 mg via INTRAVENOUS
  Filled 2020-05-14 (×3): qty 1

## 2020-05-14 MED ORDER — SIMVASTATIN 20 MG PO TABS
20.0000 mg | ORAL_TABLET | Freq: Every day | ORAL | Status: DC
  Administered 2020-05-14 – 2020-05-22 (×9): 20 mg via ORAL
  Filled 2020-05-14 (×9): qty 1

## 2020-05-14 MED ORDER — SUFENTANIL CITRATE 50 MCG/ML IV SOLN
INTRAVENOUS | Status: DC | PRN
  Administered 2020-05-14: 5 ug via INTRAVENOUS
  Administered 2020-05-14: 10 ug via INTRAVENOUS
  Administered 2020-05-14: 5 ug via INTRAVENOUS
  Administered 2020-05-14 (×4): 10 ug via INTRAVENOUS

## 2020-05-14 MED ORDER — HYDROCHLOROTHIAZIDE 12.5 MG PO CAPS
12.5000 mg | ORAL_CAPSULE | Freq: Every day | ORAL | Status: DC
  Administered 2020-05-14 – 2020-05-20 (×7): 12.5 mg via ORAL
  Filled 2020-05-14 (×7): qty 1

## 2020-05-14 SURGICAL SUPPLY — 63 items
BASKET BONE COLLECTION (BASKET) ×2 IMPLANT
BONE CANC CHIPS 20CC PCAN1/4 (Bone Implant) ×2 IMPLANT
BUR MATCHSTICK NEURO 3.0 LAGG (BURR) ×2 IMPLANT
CANISTER SUCT 3000ML PPV (MISCELLANEOUS) ×2 IMPLANT
CHIPS CANC BONE 20CC PCAN1/4 (Bone Implant) ×1 IMPLANT
CNTNR URN SCR LID CUP LEK RST (MISCELLANEOUS) ×1 IMPLANT
CONNECTOR RELINE 30MM OPEN OFF (Connector) ×4 IMPLANT
CONT SPEC 4OZ STRL OR WHT (MISCELLANEOUS) ×1
COVER BACK TABLE 60X90IN (DRAPES) ×2 IMPLANT
DERMABOND ADVANCED (GAUZE/BANDAGES/DRESSINGS) ×1
DERMABOND ADVANCED .7 DNX12 (GAUZE/BANDAGES/DRESSINGS) ×1 IMPLANT
DEVICE DISSECT PLASMABLAD 3.0S (MISCELLANEOUS) ×1 IMPLANT
DIGITIZER BENDINI (MISCELLANEOUS) ×2 IMPLANT
DRAPE C-ARM 42X72 X-RAY (DRAPES) ×4 IMPLANT
DRAPE HALF SHEET 40X57 (DRAPES) ×2 IMPLANT
DRAPE LAPAROTOMY 100X72X124 (DRAPES) ×2 IMPLANT
DRSG OPSITE POSTOP 4X12 (GAUZE/BANDAGES/DRESSINGS) ×2 IMPLANT
DURAPREP 26ML APPLICATOR (WOUND CARE) ×2 IMPLANT
DURASEAL APPLICATOR TIP (TIP) IMPLANT
DURASEAL SPINE SEALANT 3ML (MISCELLANEOUS) IMPLANT
ELECT REM PT RETURN 9FT ADLT (ELECTROSURGICAL) ×2
ELECTRODE REM PT RTRN 9FT ADLT (ELECTROSURGICAL) ×1 IMPLANT
GAUZE 4X4 16PLY RFD (DISPOSABLE) ×2 IMPLANT
GAUZE SPONGE 4X4 12PLY STRL (GAUZE/BANDAGES/DRESSINGS) ×2 IMPLANT
GLOVE BIOGEL PI IND STRL 8.5 (GLOVE) ×2 IMPLANT
GLOVE BIOGEL PI INDICATOR 8.5 (GLOVE) ×2
GLOVE ECLIPSE 8.5 STRL (GLOVE) ×4 IMPLANT
GLOVE SURG ENC MOIS LTX SZ6.5 (GLOVE) ×4 IMPLANT
GLOVE SURG ENC MOIS LTX SZ7 (GLOVE) ×4 IMPLANT
GLOVE SURG ENC TEXT LTX SZ6.5 (GLOVE) ×4 IMPLANT
GLOVE SURG ENC TEXT LTX SZ7 (GLOVE) ×4 IMPLANT
GOWN STRL REUS W/ TWL LRG LVL3 (GOWN DISPOSABLE) ×2 IMPLANT
GOWN STRL REUS W/ TWL XL LVL3 (GOWN DISPOSABLE) IMPLANT
GOWN STRL REUS W/TWL 2XL LVL3 (GOWN DISPOSABLE) ×4 IMPLANT
GOWN STRL REUS W/TWL LRG LVL3 (GOWN DISPOSABLE) ×2
GOWN STRL REUS W/TWL XL LVL3 (GOWN DISPOSABLE)
GRAFT BONE PROTEIOS LRG 5CC (Orthopedic Implant) ×2 IMPLANT
HEMOSTAT POWDER KIT SURGIFOAM (HEMOSTASIS) ×6 IMPLANT
KIT BASIN OR (CUSTOM PROCEDURE TRAY) ×2 IMPLANT
KIT TURNOVER KIT B (KITS) ×2 IMPLANT
MILL MEDIUM DISP (BLADE) IMPLANT
NEEDLE HYPO 22GX1.5 SAFETY (NEEDLE) ×2 IMPLANT
NS IRRIG 1000ML POUR BTL (IV SOLUTION) ×2 IMPLANT
PACK LAMINECTOMY NEURO (CUSTOM PROCEDURE TRAY) ×2 IMPLANT
PATTIES SURGICAL .5 X1 (DISPOSABLE) ×2 IMPLANT
PLASMABLADE 3.0S (MISCELLANEOUS) ×2
ROD RELINE-O 5.5X300 STRT NS (Rod) ×2 IMPLANT
ROD RELINE-O 5.5X300MM STRT (Rod) ×2 IMPLANT
SCREW LOCK RELINE 5.5 TULIP (Screw) ×28 IMPLANT
SCREW RELINE-O POLY 6.5X45 (Screw) ×8 IMPLANT
SCREW SPINAL 8.5X80 RELINE (Screw) ×4 IMPLANT
SPONGE LAP 4X18 RFD (DISPOSABLE) IMPLANT
SPONGE SURGIFOAM ABS GEL 100 (HEMOSTASIS) ×2 IMPLANT
SUT PROLENE 6 0 BV (SUTURE) IMPLANT
SUT VIC AB 1 CT1 18XBRD ANBCTR (SUTURE) ×1 IMPLANT
SUT VIC AB 1 CT1 8-18 (SUTURE) ×1
SUT VIC AB 2-0 CP2 18 (SUTURE) ×2 IMPLANT
SUT VIC AB 3-0 SH 8-18 (SUTURE) ×2 IMPLANT
SUT VIC AB 4-0 RB1 18 (SUTURE) ×2 IMPLANT
TOWEL GREEN STERILE (TOWEL DISPOSABLE) ×2 IMPLANT
TOWEL GREEN STERILE FF (TOWEL DISPOSABLE) ×2 IMPLANT
TRAY FOLEY MTR SLVR 16FR STAT (SET/KITS/TRAYS/PACK) ×2 IMPLANT
WATER STERILE IRR 1000ML POUR (IV SOLUTION) ×2 IMPLANT

## 2020-05-14 NOTE — Progress Notes (Signed)
Orthopedic Tech Progress Note Patient Details:  Karen Ramirez 1950/10/31 [id number] Patient has back brace  Patient ID: Karen Ramirez, female   DOB: 12/16/1950, 71 y.o.   MRN: [id number]   [name] 05/14/2020, 6:50 PM

## 2020-05-14 NOTE — Op Note (Signed)
Date of surgery: 05/14/2020 Preoperative diagnosis: Status post decompression and fusion L1-L4.  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  Lumbar radiculopathy. Postoperative diagnosis: Same Procedure: Decompression of L4 fracture with loss of fixation of L4.  Revision of posterior fixation from L1 to the pelvis with pedicle screws and iliac screws.  Posterior lateral arthrodesis with allograft and Proteus.  L4 to sacrum. Surgeon: Kristeen Miss Anesthesia: General endotracheal Indications: Karen Ramirez is a 70 year old individual who on April 5 underwent decompression and fusion at L1-2 L2-3 and L3-4.  She struggled with significant postoperative pain and follow-up films demonstrate the presence of loss of fixation of the L4 vertebrae.  She developed a kyphotic angulation with retropulsion of bone.  She had severe left lumbar radicular pain.  She is advised regarding surgical decompression and stabilization.  Procedure: The patient was brought to the operating room supine on the stretcher.  After the smooth induction of general endotracheal anesthesia, she was carefully turned prone.  The back was prepped with alcohol DuraPrep and draped in a sterile fashion.  The previous midline incision was reopened and the dissection was carried down through the lumbodorsal fascia.  The spinous process of L4 was identified.  This was carefully dissected and the lumbar space was then entered to expose the hardware at the level of L4 L3 and L2 all the way up to L1.  Once the hardware was exposed then the caps were released to remove the rods.  The L4 screws were removed.  They are noted to be substantially loose.  Then carefully I performed a dissection in the epidural space to decompress the common dural tube and the path of the L4 nerve root on the superior aspect and the common dural tube and the L5 nerve root inferiorly across the disc  space there is significant retropulsed bone and bone graft material that was encountered in this region as a decompression continued is felt that there was some reduction of the kyphotic deformity.  The implants were then ultimately able to be seen and they had retroverted themselves into the disc space which is a had been preoperatively.  Once the decompression was carefully completed hemostasis around the soft tissues was obtained by using some Surgifoam and cottonoid patties.  There was continuous ooze as this was a very hyperemic area having recently had surgery 5 weeks ago.  With the decompression being secured sacral and L5 screws were then planned by dissecting inferiorly to expose down to the sacral ala the transverse process of L5 and the facet joints at L5-S1.  There is not much movement at all across the L5-S1 interspace.  Then iliac entry sites were chosen over the posterior suprailiac crest region by direct palpation.  This area was infiltrated with 10 cc of lidocaine with epinephrine mixed with half percent Marcaine and the fascia overlying the insertion site was opened using a Leksell rongeur a pedicle probe was then passed to the iliac bone and checked radiographically.  8.5 x 80 mm screws were then passed into the ilium.  Pedicle entry sites were then chosen at L5 and S1 with fluoroscopic guidance and these were 6.5 x 45 mm screws.  Prior to doing this the ala transverse process of L5 transverse process area of L4 was decorticated amply.  Prior to placing the screws then 20 cc of cancellous chips allograft was mixed with Proteus and packed into the lateral gutters along with any  bone that was removed during the decompression.  Once the pedicle screws were placed transverse connector was placed onto the iliac screw and Bandini was used to secure appropriate rod length and contour to rods to fit between L1 and the iliac screws.  These were then tightened in a neutral construct.  The rod length was  noted to be approximately 175 mm for each rod.  Once the rods were placed in a neutral construct final radiographs were obtained in AP and lateral projection.  Then the path of the common dural tube and the L5 nerve roots and the L4 nerve roots were checked to make sure that there are amply decompressed Neuropad's out the foramen when this was verified the remainder of the bone graft was packed into the lateral gutters across the L4 and L5 interval we will and then retractors were removed hemostasis was secured and additional 15 cc of half percent Marcaine was injected into the paraspinous fascia and then the lumbodorsal fascia was closed with #1 Vicryl in interrupted fashion 2-0 Vicryl in the subcutaneous tissues 3-0 Vicryl subcuticularly along with some 4-0 Vicryl subcuticularly.  Surgical staples were placed in the skin.  Blood loss for the procedure was estimated at 1100 cc 300  cc of Cell Saver blood was returned to the patient.

## 2020-05-14 NOTE — Anesthesia Procedure Notes (Signed)
Procedure Name: Intubation Date/Time: 05/14/2020 7:45 AM Performed by: Moshe Salisbury, CRNA Pre-anesthesia Checklist: Patient identified, Emergency Drugs available, Suction available and Patient being monitored Patient Re-evaluated:Patient Re-evaluated prior to induction Oxygen Delivery Method: Circle System Utilized Preoxygenation: Pre-oxygenation with 100% oxygen Induction Type: IV induction Ventilation: Mask ventilation without difficulty Laryngoscope Size: Mac and 3 Grade View: Grade I Tube type: Oral Tube size: 7.5 mm Number of attempts: 1 Airway Equipment and Method: Stylet Placement Confirmation: ETT inserted through vocal cords under direct vision,  positive ETCO2 and breath sounds checked- equal and bilateral Secured at: 22 cm Tube secured with: Tape Dental Injury: Teeth and Oropharynx as per pre-operative assessment

## 2020-05-14 NOTE — Transfer of Care (Signed)
Immediate Anesthesia Transfer of Care Note  Patient: Karen Ramirez  Procedure(s) Performed: Revision of fixation from Lumbar Two to lium with decompression of Lumbar Four vertebral fracture (N/A Back)  Patient Location: PACU  Anesthesia Type:General  Level of Consciousness: drowsy and patient cooperative  Airway & Oxygen Therapy: Patient Spontanous Breathing and Patient connected to nasal cannula oxygen  Post-op Assessment: Report given to RN, Post -op Vital signs reviewed and stable and Patient moving all extremities  Post vital signs: Reviewed and stable  Last Vitals:  Vitals Value Taken Time  BP 155/103 05/14/20 1225  Temp    Pulse 115 05/14/20 1228  Resp 11 05/14/20 1228  SpO2 100 % 05/14/20 1228  Vitals shown include unvalidated device data.  Last Pain:  Vitals:   05/14/20 0653  TempSrc: Oral         Complications: No complications documented.

## 2020-05-14 NOTE — Anesthesia Preprocedure Evaluation (Signed)
Anesthesia Evaluation  Patient identified by MRN, date of birth, ID band Patient awake    Reviewed: Allergy & Precautions, H&P , NPO status , Patient's Chart, lab work & pertinent test results  History of Anesthesia Complications (+) PONV and history of anesthetic complications  Airway Mallampati: II   Neck ROM: full    Dental   Pulmonary neg pulmonary ROS,    breath sounds clear to auscultation       Cardiovascular  Rhythm:regular Rate:Normal     Neuro/Psych    GI/Hepatic GERD  ,  Endo/Other  Hypothyroidism   Renal/GU stones     Musculoskeletal  (+) Arthritis ,   Abdominal   Peds  Hematology   Anesthesia Other Findings   Reproductive/Obstetrics                            Anesthesia Physical Anesthesia Plan  ASA: II  Anesthesia Plan: General   Post-op Pain Management:    Induction: Intravenous  PONV Risk Score and Plan: 4 or greater and Ondansetron, Dexamethasone, Midazolam and Treatment may vary due to age or medical condition  Airway Management Planned: Oral ETT  Additional Equipment:   Intra-op Plan:   Post-operative Plan: Extubation in OR  Informed Consent: I have reviewed the patients History and Physical, chart, labs and discussed the procedure including the risks, benefits and alternatives for the proposed anesthesia with the patient or authorized representative who has indicated his/her understanding and acceptance.     Dental advisory given  Plan Discussed with: CRNA, Anesthesiologist and Surgeon  Anesthesia Plan Comments:         Anesthesia Quick Evaluation

## 2020-05-14 NOTE — H&P (Signed)
Karen Ramirez is an 70 y.o. female.   Chief Complaint: Back and left lower extremity pain worsened since surgery April 5 HPI: Karen Ramirez is a 70 year old individual who had significant lumbar spinal stenosis with herniated nucleus pulposus at L1-2 and L2-3.  She has had a previous lumbar decompression using a Coflex device.  She had a microdiscectomy for a herniated disc on the right side which yielded only modest relief and ultimately was advised that she should undergo surgical decompression formally at L1-2 L2-3 and L3-4.  She tolerated the surgery well on April 5 however as she was rather debilitated and underwent a stay at a skilled nursing facility.  During that time her pain continued to worsen.  When seen in the office for first postoperative visit it was apparent that she had lost fixation at L4 with a kyphotic deformity and had retropulsed the interbody spacers.  Because of this she is now being admitted to undergo revision surgery to decompress L4 and performed fixation to the pelvis in order to stabilize the mid lumbar spine.  Past Medical History:  Diagnosis Date  . Arthritis   . Complication of anesthesia   . DDD (degenerative disc disease), lumbar   . Dermatitis   . Diverticulosis   . Dysrhythmia    pt states "occassionally"   . GERD (gastroesophageal reflux disease)   . Hemorrhoids   . History of kidney stones   . Hypercholesteremia   . Hyperglycemia   . Hypothyroidism   . Leg edema   . Osteoporosis   . PMR (polymyalgia rheumatica) (HCC)   . PONV (postoperative nausea and vomiting)   . Renal stone   . Vitamin D deficiency   . Wears glasses     Past Surgical History:  Procedure Laterality Date  . ABDOMINAL HYSTERECTOMY  1986  . APPENDECTOMY    . BACK SURGERY    . BILATERAL CARPAL TUNNEL RELEASE  2014  . BLEPHAROPLASTY  2016   both eyes  . COLONOSCOPY    . ESOPHAGOGASTRODUODENOSCOPY    . LUMBAR LAMINECTOMY  1989  . LUMBAR LAMINECTOMY WITH COFLEX 2 LEVEL  N/A 04/12/2015   Procedure: Lumbar two- three,  Lumbar three- four Laminectomy with Coflex;  Surgeon: Kristeen Miss, MD;  Location: [facility];  Service: Neurosurgery;  Laterality: N/A;  L2-3 L3-4 Laminectomy with Coflex  . SHOULDER ARTHROSCOPY WITH BICEPSTENOTOMY Right 05/10/2014   Procedure: SHOULDER ARTHROSCOPY WITH BICEPSTENOTOMY;  Surgeon: Tania Ade, MD;  Location: [facility];  Service: Orthopedics;  Laterality: Right;  . SHOULDER ARTHROSCOPY WITH DISTAL CLAVICLE RESECTION Right 05/10/2014   Procedure: SHOULDER ARTHROSCOPY WITH DISTAL CLAVICLE RESECTION DEBRIDEMENT OF ROTATOR CUFF TEAR;  Surgeon: Tania Ade, MD;  [facility];  Service: Orthopedics;  Laterality: Right;  . SHOULDER ARTHROSCOPY WITH SUBACROMIAL DECOMPRESSION Right 05/10/2014   Procedure: SHOULDER ARTHROSCOPY WITH SUBACROMIAL DECOMPRESSION;  Surgeon: Tania Ade, MD;  Location: [facility];  Service: Orthopedics;  Laterality: Right;  . THUMB ARTHROSCOPY  3/16   left  . TONSILLECTOMY    . VEIN LIGATION     left leg    Family History  Problem Relation Age of Onset  . Breast cancer Maternal Aunt 70  . Breast cancer Maternal Aunt 70   Social History:  reports that she has never smoked. She has never used smokeless tobacco. She reports current alcohol use. She reports that she does not use drugs.  Allergies:  Allergies  Allergen Reactions  . Azithromycin Diarrhea  .  Levaquin [Levofloxacin] Nausea And Vomiting  . Ultram [Tramadol] Nausea And Vomiting    Medications Prior to Admission  Medication Sig Dispense Refill  . carboxymethylcellulose (REFRESH PLUS) 0.5 % SOLN Place 1 drop into both eyes at bedtime.    . celecoxib (CELEBREX) 200 MG capsule Take 200 mg by mouth in the morning and at bedtime.    . Cholecalciferol (VITAMIN D3) 125 MCG (5000 UT) TABS Take 5,000 Units by mouth every other day.    . diazepam (VALIUM) 5 MG tablet Take 2.5 mg by mouth at  bedtime.    . famotidine (PEPCID) 20 MG tablet Take 20 mg by mouth 2 (two) times daily before a meal.    . folic acid (FOLVITE) 1 MG tablet Take 2 mg by mouth daily.    . hydrochlorothiazide (HYDRODIURIL) 12.5 MG tablet Take 12.5 mg by mouth daily.    Marland Kitchen HYDROcodone-acetaminophen (NORCO/VICODIN) 5-325 MG tablet Take 1-2 tablets by mouth every 4 (four) hours as needed for moderate pain. 30 tablet 0  . levothyroxine (SYNTHROID, LEVOTHROID) 75 MCG tablet Take 75 mcg by mouth daily before breakfast.    . predniSONE (DELTASONE) 10 MG tablet Take 10 mg by mouth in the morning.    . pregabalin (LYRICA) 75 MG capsule Take 75 mg by mouth 3 (three) times daily.    . simvastatin (ZOCOR) 20 MG tablet Take 20 mg by mouth at bedtime.     . triamcinolone (NASACORT) 55 MCG/ACT AERO nasal inhaler Place 1 spray into the nose at bedtime.    . methocarbamol (ROBAXIN) 500 MG tablet Take 1 tablet (500 mg total) by mouth every 6 (six) hours as needed for muscle spasms. 90 tablet 1    Results for orders placed or performed during the hospital encounter of 05/14/20 (from the past 48 hour(s))  CBC     Status: Abnormal   Collection Time: 05/14/20  6:16 AM  Result Value Ref Range   WBC 11.0 (H) 4.0 - 10.5 K/uL   RBC 4.23 3.87 - 5.11 MIL/uL   Hemoglobin 12.2 12.0 - 15.0 g/dL   HCT 40.1 36.0 - 46.0 %   MCV 94.8 80.0 - 100.0 fL   MCH 28.8 26.0 - 34.0 pg   MCHC 30.4 30.0 - 36.0 g/dL   RDW 14.9 11.5 - 15.5 %   Platelets 268 150 - 400 K/uL   nRBC 0.0 0.0 - 0.2 %    Comment: Performed at [facility], [place] [address] [place], [place] [address]   No results found.  Review of Systems  Constitutional: Positive for activity change.  HENT: Negative.   Eyes: Negative.   Respiratory: Negative.   Cardiovascular: Negative.   Gastrointestinal: Negative.   Endocrine: Negative.   Genitourinary: Negative.   Musculoskeletal: Positive for back pain and myalgias.  Allergic/Immunologic: Negative.   Neurological: Positive  for weakness and numbness.  Hematological: Negative.   Psychiatric/Behavioral: Negative.     Blood pressure 122/78, pulse 90, temperature 98.3 F (36.8 C), temperature source Oral, resp. rate 16, height 5' 2.52" (1.588 m), weight 78.5 kg, SpO2 95 %. Physical Exam Constitutional:      Appearance: Normal appearance.  HENT:     Head: Normocephalic and atraumatic.     Nose: Nose normal.     Mouth/Throat:     Mouth: Mucous membranes are moist.  Eyes:     Extraocular Movements: Extraocular movements intact.     Pupils: Pupils are equal, round, and reactive to light.  Cardiovascular:  Rate and Rhythm: Normal rate and regular rhythm.     Pulses: Normal pulses.     Heart sounds: Normal heart sounds.  Pulmonary:     Effort: Pulmonary effort is normal.     Breath sounds: Normal breath sounds.  Abdominal:     General: Abdomen is flat. Bowel sounds are normal.     Palpations: Abdomen is soft.  Musculoskeletal:     Comments: Marked tenderness in the lumbar spine positive straight leg raising in either lower extremity at 15 degrees.  Patrick's maneuver is negative  Skin:    General: Skin is warm and dry.     Capillary Refill: Capillary refill takes less than 2 seconds.  Neurological:     Mental Status: She is alert.     Comments: [facility] in the left iliopsoas and quad graded 3 out of 5 tibialis anterior is graded at 4 out of 5 gastroc strength is 4 out of 5.  Absent reflexes in the left lower extremity.  Motor strength is 5 out of 5 in the right lower extremity.  Absent reflexes in the patellae and Achilles are noted.  Upper extremity strength and reflexes are normal.  Cranial nerve examination is normal.  Psychiatric:        Mood and Affect: Mood normal.        Behavior: Behavior normal.        Thought Content: Thought content normal.        Judgment: Judgment normal.      Assessment/Plan Loss of fixation at L4 with kyphotic deformity and retropulsion of the implanted hardware.   Left lumbar radicular pain.  Plan: Lumbar decompression of L4 revision of hardware to include fixation to the pelvis from L1.  Earleen Newport, MD 05/14/2020, 7:33 AM

## 2020-05-14 NOTE — Evaluation (Signed)
Physical Therapy Evaluation Patient Details Name: Karen Ramirez MRN: [id number] DOB: Jun 15, 1950 Today's Date: 05/14/2020   History of Present Illness  Karen Ramirez is a 70 year old individual who on April 5 underwent decompression and fusion at L1-2 L2-3 and L3-4.  She struggled with significant postoperative pain and follow-up films demonstrate the presence of loss of fixation of the L4 vertebrae.  She developed a kyphotic angulation with retropulsion of bone.  She had severe left lumbar radicular pain. 05/14/20 s/p Lumbar decompression of L4 revision of hardware to include fixation to the pelvis from L1.  Clinical Impression  Patient is s/p above surgery presenting with functional limitations due to the deficits listed below (see PT Problem List). Reviewed bed mobility including log roll this afternoon. Pt tolerated sitting EOB however did not attempt to ambulate due to drop in SpO2 (79% on 2L - increased to 4L and improved to 95%.) RN notified, getting continuous monitor and leaving at 4L for now. Husband present very supportive. Likely need SNF at d/c as she made limited progress PTA for this revision but we will continue to monitor and progress as tolerated. Patient will benefit from skilled PT to increase their independence and safety with mobility to allow discharge to the venue listed below.       Follow Up Recommendations SNF;Other (comment) (Will continue to monitor and update recs as appropriate.)    Equipment Recommendations  None recommended by PT    Recommendations for Other Services       Precautions / Restrictions Precautions Precautions: Back;Fall Precaution Booklet Issued: Yes (comment) Precaution Comments: reviewed with pt and husband Restrictions Weight Bearing Restrictions: No      Mobility  Bed Mobility Overal bed mobility: Needs Assistance Bed Mobility: Rolling;Sidelying to Sit;Sit to Sidelying Rolling: Min assist Sidelying to sit: Mod assist      Sit to sidelying: Min assist General bed mobility comments: Practiced log roll technique to Lt and Rt, mod assist for trunk and LE support out of bed. Mina ssist to roll and for LEs back into bed.    Transfers                    Ambulation/Gait                Stairs            Wheelchair Mobility    Modified Rankin (Stroke Patients Only)       Balance Overall balance assessment: Needs assistance Sitting-balance support: Bilateral upper extremity supported;Feet supported Sitting balance-Leahy Scale: Poor Sitting balance - Comments: slight posterior lean Postural control: Posterior lean                                   Pertinent Vitals/Pain Pain Assessment: 0-10 Pain Score: 5  Pain Location: back and hips Pain Descriptors / Indicators: Aching Pain Intervention(s): Limited activity within patient's tolerance;Monitored during session;Repositioned;RN gave pain meds during session    Home Living Family/patient expects to be discharged to:: Skilled nursing facility Living Arrangements: Spouse/significant other Available Help at Discharge: Family;Available 24 hours/day Type of Home: House Home Access: Ramped entrance     Home Layout: Two level;Able to live on main level with bedroom/bathroom Home Equipment: Shower seat - built in;Hand held Tourist information centre manager - 2 wheels;Wheelchair - manual      Prior Function Level of Independence: Needs assistance   Gait / Transfers Assistance Needed: Amb RW with  assist at SNF prior to revision. Prior to SNF pt independent.  ADL's / Homemaking Assistance Needed: Assistance at SNF. Pt independent at home prior to SNF        Hand Dominance        Extremity/Trunk Assessment   Upper Extremity Assessment Upper Extremity Assessment: Defer to OT evaluation    Lower Extremity Assessment Lower Extremity Assessment: Generalized weakness       Communication   Communication: No difficulties   Cognition Arousal/Alertness: Lethargic Behavior During Therapy: WFL for tasks assessed/performed Overall Cognitive Status: Within Functional Limits for tasks assessed                                        General Comments General comments (skin integrity, edema, etc.): SpO2 fluctuating heavily, limiting further progression today. 79% on 2L. Increased to 95% on 4L with deep breathing, but decreases to low 90s when she stops the deep breaths. RN notifed, getting continuous monitor, and left at 4L. Pt comfortable, without SOB or HA.    Exercises     Assessment/Plan    PT Assessment Patient needs continued PT services  PT Problem List Decreased strength;Decreased range of motion;Decreased activity tolerance;Decreased balance;Decreased mobility;Decreased knowledge of use of DME;Decreased knowledge of precautions;Obesity;Pain       PT Treatment Interventions DME instruction;Gait training;Functional mobility training;Therapeutic activities;Therapeutic exercise;Balance training;Neuromuscular re-education;Patient/family education    PT Goals (Current goals can be found in the Care Plan section)  Acute Rehab PT Goals Patient Stated Goal: Get well PT Goal Formulation: With patient Time For Goal Achievement: 05/28/20 Potential to Achieve Goals: Good    Frequency Min 4X/week   Barriers to discharge        Co-evaluation               AM-PAC PT "6 Clicks" Mobility  Outcome Measure Help needed turning from your back to your side while in a flat bed without using bedrails?: A Little Help needed moving from lying on your back to sitting on the side of a flat bed without using bedrails?: A Lot Help needed moving to and from a bed to a chair (including a wheelchair)?: A Lot Help needed standing up from a chair using your arms (e.g., wheelchair or bedside chair)?: A Lot Help needed to walk in hospital room?: A Lot Help needed climbing 3-5 steps with a railing? :  Total 6 Click Score: 12    End of Session   Activity Tolerance: Treatment limited secondary to medical complications (Comment) (Low O2) Patient left: in bed;with call bell/phone within reach;with bed alarm set;with family/visitor present Nurse Communication: Mobility status;Other (comment) (Sats low) PT Visit Diagnosis: Muscle weakness (generalized) (M62.81);Difficulty in walking, not elsewhere classified (R26.2);Pain Pain - part of body:  (back)    Time: 5784-6962 PT Time Calculation (min) (ACUTE ONLY): 34 min   Charges:   PT Evaluation $PT Eval Low Complexity: 1 Low PT Treatments $Therapeutic Activity: 8-22 mins        Elayne Snare, PT, DPT  Ellouise Newer 05/14/2020, 5:03 PM

## 2020-05-14 NOTE — Anesthesia Postprocedure Evaluation (Signed)
Anesthesia Post Note  Patient: Karen Ramirez  Procedure(s) Performed: Revision of fixation from Lumbar Two to lium with decompression of Lumbar Four vertebral fracture (N/A Back)     Patient location during evaluation: PACU Anesthesia Type: General Level of consciousness: awake and alert Pain management: pain level controlled Vital Signs Assessment: post-procedure vital signs reviewed and stable Respiratory status: spontaneous breathing, nonlabored ventilation, respiratory function stable and patient connected to nasal cannula oxygen Cardiovascular status: blood pressure returned to baseline and stable Postop Assessment: no apparent nausea or vomiting Anesthetic complications: no   No complications documented.  Last Vitals:  Vitals:   05/14/20 1506 05/14/20 2011  BP: 131/77 97/62  Pulse: 98 100  Resp: 15 16  Temp: 36.6 C 36.8 C  SpO2:  94%    Last Pain:  Vitals:   05/14/20 2011  TempSrc: Oral  PainSc:                  [place]

## 2020-05-14 NOTE — Progress Notes (Signed)
Patient ID: Karen Ramirez, female   DOB: 23-Jul-1950, 70 y.o.   MRN: [id number] Awake and alert moving lower extremities well back with reasonable comfort.  Dressing clean and dry.  Stable postop

## 2020-05-15 LAB — BASIC METABOLIC PANEL
Anion gap: 10 (ref 5–15)
BUN: 20 mg/dL (ref 8–23)
CO2: 25 mmol/L (ref 22–32)
Calcium: 8.3 mg/dL — ABNORMAL LOW (ref 8.9–10.3)
Chloride: 102 mmol/L (ref 98–111)
Creatinine, Ser: 0.97 mg/dL (ref 0.44–1.00)
GFR, Estimated: >60 mL/min (ref >60)
Glucose, Bld: 157 mg/dL — ABNORMAL HIGH (ref 70–99)
Potassium: 4.2 mmol/L (ref 3.5–5.1)
Sodium: 137 mmol/L (ref 135–145)

## 2020-05-15 LAB — CBC
HCT: 25.8 % — ABNORMAL LOW (ref 36.0–46.0)
Hemoglobin: 7.8 g/dL — ABNORMAL LOW (ref 12.0–15.0)
MCH: 28.9 pg (ref 26.0–34.0)
MCHC: 30.2 g/dL (ref 30.0–36.0)
MCV: 95.6 fL (ref 80.0–100.0)
Platelets: 168 10*3/uL (ref 150–400)
RBC: 2.7 MIL/uL — ABNORMAL LOW (ref 3.87–5.11)
RDW: 14.7 % (ref 11.5–15.5)
WBC: 13.7 10*3/uL — ABNORMAL HIGH (ref 4.0–10.5)
nRBC: 0 % (ref 0.0–0.2)

## 2020-05-15 NOTE — Progress Notes (Signed)
Physical Therapy Treatment Patient Details Name: YERALDI FIDLER MRN: [id number] DOB: [birth year] Today's Date: 05/15/2020    History of Present Illness Ryliegh Mcduffey is a 70 year old individual who on April 5 underwent decompression and fusion at L1-2 L2-3 and L3-4.  She struggled with significant postoperative pain and follow-up films demonstrate the presence of loss of fixation of the L4 vertebrae.  She developed a kyphotic angulation with retropulsion of bone.  She had severe left lumbar radicular pain. 05/14/20 s/p Lumbar decompression of L4 revision of hardware to include fixation to the pelvis from L1.    PT Comments    Lower extremities weak making transfers out of bed difficult, up to mod assist from elevated bed surface to stand and pivot to recliner. Rt knee noted to be a little unsteady but without buckling. Noted Rt ankle dorsiflexion weakness (3/5 strength) today, RN notified. Denies sensory changes. Pain radiating into rt hip this morning. Pt sitting in recliner at end of therapy session. Patient will continue to benefit from skilled physical therapy services to further improve independence with functional mobility.    Follow Up Recommendations  SNF     Equipment Recommendations  None recommended by PT    Recommendations for Other Services       Precautions / Restrictions Precautions Precautions: Back;Fall Precaution Comments: reviewed prec and booklet with pt and husband Restrictions Weight Bearing Restrictions: No    Mobility  Bed Mobility Overal bed mobility: Needs Assistance Bed Mobility: Rolling;Sidelying to Sit Rolling: Min guard Sidelying to sit: Min assist       General bed mobility comments: Progressed today to min guard for Rt roll with review of log roll technique. Min assist to lower LEs from bed, and min assist for trunk to rise, good push through RUE this date.    Transfers Overall transfer level: Needs assistance Equipment used: Rolling  walker (2 wheeled) Transfers: Sit to/from [facility] Sit to Stand: Mod assist;From elevated surface Stand pivot transfers: Mod assist       General transfer comment: Mod assist to stand twice from elevated bed surface, as she did not have enough strength to boost from lower bed setting. LEs notably weak with sit<>stand. Cues for technique and hand placement. Rt knee unstable but did not overtly buckle. Mod assist with upright support and RW control to transfer to recliner.  Ambulation/Gait             General Gait Details: Not safe at this time.   Stairs             Wheelchair Mobility    Modified Rankin (Stroke Patients Only)       Balance Overall balance assessment: Needs assistance Sitting-balance support: Feet supported;No upper extremity supported Sitting balance-Leahy Scale: Fair Sitting balance - Comments: Sat EOB independently, able to brush her teeth.                                    Cognition Arousal/Alertness: Awake/alert Behavior During Therapy: WFL for tasks assessed/performed Overall Cognitive Status: Within Functional Limits for tasks assessed                                        Exercises General Exercises - Lower Extremity Ankle Circles/Pumps: AROM;Both;15 reps    General Comments General comments (skin integrity, edema, etc.):  on 3L supplemental O2, no complaints of dyspnea.      Pertinent Vitals/Pain Pain Assessment: 0-10 Pain Score: 5  Pain Location: Rt hip Pain Descriptors / Indicators: Aching Pain Intervention(s): Monitored during session;Repositioned;Premedicated before session    Home Living                      Prior Function            PT Goals (current goals can now be found in the care plan section) Acute Rehab PT Goals Patient Stated Goal: Get well PT Goal Formulation: With patient Time For Goal Achievement: 05/28/20 Potential to Achieve Goals:  Good Progress towards PT goals: Progressing toward goals    Frequency    Min 4X/week      PT Plan Current plan remains appropriate    Co-evaluation              AM-PAC PT "6 Clicks" Mobility   Outcome Measure  Help needed turning from your back to your side while in a flat bed without using bedrails?: A Little Help needed moving from lying on your back to sitting on the side of a flat bed without using bedrails?: A Little Help needed moving to and from a bed to a chair (including a wheelchair)?: A Lot Help needed standing up from a chair using your arms (e.g., wheelchair or bedside chair)?: A Lot Help needed to walk in hospital room?: A Lot Help needed climbing 3-5 steps with a railing? : Total 6 Click Score: 13    End of Session Equipment Utilized During Treatment: Gait belt;Oxygen Activity Tolerance: Patient tolerated treatment well Patient left: with call bell/phone within reach;with family/visitor present;in chair;with chair alarm set Nurse Communication: Mobility status (2+ from nursing staff due to low height of chair.) PT Visit Diagnosis: Muscle weakness (generalized) (M62.81);Difficulty in walking, not elsewhere classified (R26.2);Pain Pain - part of body:  (back - Rt hip today)     Time: 1856-3149 PT Time Calculation (min) (ACUTE ONLY): 32 min  Charges:  $Therapeutic Activity: 23-37 mins                     Elayne Snare, PT, DPT   Ellouise Newer 05/15/2020, 12:30 PM

## 2020-05-15 NOTE — Evaluation (Signed)
Occupational Therapy Evaluation Patient Details Name: Karen Ramirez MRN: [id number] DOB: 22-Jan-1950 Today's Date: 05/15/2020    History of Present Illness Karen Ramirez is a 70 year old individual who on April 5 underwent decompression and fusion at L1-2 L2-3 and L3-4.  She struggled with significant postoperative pain and follow-up films demonstrate the presence of loss of fixation of the L4 vertebrae.  She developed a kyphotic angulation with retropulsion of bone.  She had severe left lumbar radicular pain. 05/14/20 s/p Lumbar decompression of L4 revision of hardware to include fixation to the pelvis from L1.   Clinical Impression   Prior to admission, while at SNF following sx in April, pt was using RW for functional mobility and required assistance with ADL. At baseline, pt was independent with functional mobility and independent with ADL. Pt currently requires max to stand and transfer to [facility] using the sara steady. Pt requires maxA for LB dressing and minA for donning/doffing back brace. Pt reports bilateral LE pain this date and expresses increased weakness in RLE. Pt on 3lnc at rest SpO2 94%, pt desats to 88% with exertion on 3lnc, required 5lnc with pursed lip breathing to return SpO2 >92%.  Due to decline in current level of function, pt would benefit from acute OT to address established goals to facilitate safe D/C to venue listed below. At this time, recommend SNF follow-up. Will continue to follow acutely.     Follow Up Recommendations  SNF    Equipment Recommendations  3 in 1 bedside commode    Recommendations for Other Services       Precautions / Restrictions Precautions Precautions: Back;Fall Precaution Booklet Issued: Yes (comment) Precaution Comments: reviewed prec and booklet with pt and husband Required Braces or Orthoses: Spinal Brace Spinal Brace: Lumbar corset Restrictions Weight Bearing Restrictions: No      Mobility Bed Mobility Overal bed mobility:  Needs Assistance Bed Mobility: Rolling;Sit to Sidelying Rolling: Min guard Sidelying to sit: Min assist     Sit to sidelying: Mod assist General bed mobility comments: modA for BLE return to bed, cues for sequencing log roll technique    Transfers Overall transfer level: Needs assistance Equipment used: Rolling walker (2 wheeled) Transfers: Sit to/from Stand Sit to Stand: Max assist Stand pivot transfers: Mod assist       General transfer comment: maxA to powerup into standing from recliner and from [facility]. Pt with tranfsfer using sara stedy from recliner > BSC, BSC >bed    Balance Overall balance assessment: Needs assistance Sitting-balance support: Feet supported;No upper extremity supported Sitting balance-Leahy Scale: Fair Sitting balance - Comments: sat EOB independently Postural control: Posterior lean (intermittent, pt able to correct) Standing balance support: Bilateral upper extremity supported Standing balance-Leahy Scale: Poor Standing balance comment: pt stood on sara stedy with minA for safety, pt tolerated standing x30seconds                           ADL either performed or assessed with clinical judgement   ADL Overall ADL's : Needs assistance/impaired Eating/Feeding: Set up;Sitting   Grooming: Set up;Sitting   Upper Body Bathing: Minimal assistance;Sitting   Lower Body Bathing: Moderate assistance;Sitting/lateral leans   Upper Body Dressing : Minimal assistance Upper Body Dressing Details (indicate cue type and reason): wtih donning back brace Lower Body Dressing: Moderate assistance Lower Body Dressing Details (indicate cue type and reason): to access Bilateral feet Toilet Transfer: Maximal assistance;BSC Toilet Transfer Details (indicate cue type and reason):  with use of sara stedy to transfer pt from recliner to [place] and Hygiene: Maximal assistance;Sit to/from stand Toileting - Clothing Manipulation Details  (indicate cue type and reason): maxA to powerup into standing, assist for pericare in standing     Functional mobility during ADLs: Maximal assistance General ADL Comments: pt limited by BLE weakness, pain, and decreased activity tolerance     Vision         Perception     Praxis      Pertinent Vitals/Pain Pain Assessment: 0-10 Pain Score: 5  Pain Location: BLE Pain Descriptors / Indicators: Aching Pain Intervention(s): Limited activity within patient's tolerance;Monitored during session     Hand Dominance Right   Extremity/Trunk Assessment Upper Extremity Assessment Upper Extremity Assessment: Overall WFL for tasks assessed   Lower Extremity Assessment Lower Extremity Assessment: Defer to PT evaluation (generalized weakness, appears weaker in RLE)       Communication Communication Communication: No difficulties   Cognition Arousal/Alertness: Awake/alert Behavior During Therapy: WFL for tasks assessed/performed Overall Cognitive Status: Within Functional Limits for tasks assessed                                     General Comments  on 3lnc, at rest SpO2 94%, with exertion pt desats to 88%, required 5lnc with 3 min pursed lip breathing to return SpO2 >92%. Left pt on 3lnc at end of session.    Exercises Exercises: General Lower Extremity General Exercises - Lower Extremity Ankle Circles/Pumps: AROM;Both;15 reps   Shoulder Instructions      Home Living Family/patient expects to be discharged to:: Skilled nursing facility Living Arrangements: Spouse/significant other Available Help at Discharge: Family;Available 24 hours/day Type of Home: House Home Access: Ramped entrance     Home Layout: Two level;Able to live on main level with bedroom/bathroom     Bathroom Shower/Tub: Occupational psychologist: Standard     Home Equipment: Shower seat - built in;Hand held Tourist information centre manager - 2 wheels;Wheelchair - manual          Prior  Functioning/Environment Level of Independence: Needs assistance  Gait / Transfers Assistance Needed: Amb RW with assist at SNF prior to revision. Prior to SNF pt independent. ADL's / Homemaking Assistance Needed: Assistance at SNF. Pt independent at home prior to SNF            OT Problem List: Decreased strength;Decreased range of motion;Decreased activity tolerance;Impaired balance (sitting and/or standing);Decreased safety awareness;Decreased knowledge of use of DME or AE;Decreased knowledge of precautions;Pain      OT Treatment/Interventions: Self-care/ADL training;Therapeutic exercise;Energy conservation;DME and/or AE instruction;Therapeutic activities;Patient/family education;Balance training    OT Goals(Current goals can be found in the care plan section) Acute Rehab OT Goals Patient Stated Goal: to get stronger and return home after SNF OT Goal Formulation: With patient Time For Goal Achievement: 05/29/20 Potential to Achieve Goals: Good ADL Goals Pt Will Perform Upper Body Dressing: with modified independence;sitting Pt Will Transfer to Toilet: with min assist;stand pivot transfer Additional ADL Goal #1: Pt will complete bed mobility at modified independent level in preparation for ADL and functional mobility.  OT Frequency:     Barriers to D/C:            Co-evaluation              AM-PAC OT "6 Clicks" Daily Activity     Outcome Measure Help  from another person eating meals?: A Little Help from another person taking care of personal grooming?: A Little Help from another person toileting, which includes using toliet, bedpan, or urinal?: A Lot Help from another person bathing (including washing, rinsing, drying)?: A Lot Help from another person to put on and taking off regular upper body clothing?: A Little Help from another person to put on and taking off regular lower body clothing?: A Lot 6 Click Score: 15   End of Session Equipment Utilized During Treatment:  Gait belt;Back brace (sara stedy) Nurse Communication: Mobility status;Other (comment) (100cc urine output, pt requesting meds for heartburn)  Activity Tolerance: Patient tolerated treatment well Patient left: in bed;with call bell/phone within reach;with bed alarm set;with family/visitor present  OT Visit Diagnosis: Unsteadiness on feet (R26.81);Other abnormalities of gait and mobility (R26.89);Muscle weakness (generalized) (M62.81);Pain Pain - part of body:  (back and BLE)                Time: 1300-1340 OT Time Calculation (min): 40 min Charges:  OT General Charges $OT Visit: 1 Visit OT Evaluation $OT Eval Moderate Complexity: 1 Mod OT Treatments $Self Care/Home Management : 23-37 mins  Helene Kelp OTR/L Acute Rehabilitation Services Office: [place] 05/15/2020, 2:02 PM

## 2020-05-15 NOTE — Plan of Care (Addendum)
No acute events VSS, afebrile Dressing remains dry and intact, old drainage noted Pt c/o 7-8/10 pain, PRN morphine and scheduled toradol given with relief Pt tolerating PO intake well Currently resting comfortably Will continue to monitor and follow plan of care   Problem: Education: Goal: Knowledge of General Education information will improve  Outcome: Progressing   Problem: Health Behavior/Discharge Planning: Goal: Ability to manage health-related needs will improve Outcome: Progressing   Problem: Clinical Measurements: Goal: Ability to maintain clinical measurements within normal limits will improve Outcome: Progressing Goal: Will remain free from infection Outcome: Progressing Goal: Diagnostic test results will improve Outcome: Progressing Goal: Respiratory complications will improve Outcome: Progressing Goal: Cardiovascular complication will be avoided Outcome: Progressing   Problem: Activity: Goal: Risk for activity intolerance will decrease Outcome: Progressing   Problem: Nutrition: Goal: Adequate nutrition will be maintained Outcome: Progressing   Problem: Coping: Goal: Level of anxiety will decrease Outcome: Progressing   Problem: Elimination: Goal: Will not experience complications related to bowel motility Outcome: Progressing Goal: Will not experience complications related to urinary retention Outcome: Progressing   Problem: Pain Managment: Goal: General experience of comfort will improve Outcome: Progressing   Problem: Safety: Goal: Ability to remain free from injury will improve Outcome: Progressing   Problem: Skin Integrity: Goal: Risk for impaired skin integrity will decrease Outcome: Progressing

## 2020-05-15 NOTE — Progress Notes (Signed)
[facility] on-call to notify of pt complaint of new onset numbness & weakness in right foot. On assessment weaker than morning and noon strength. Call back staff states she wants CT to r/o hematoma. Pain medication offered prior to calling [facility], re-evaluation of pain and pt states pain and numbness have improved. Pt asking to advance diet. Will continue to monitor.

## 2020-05-15 NOTE — Progress Notes (Signed)
She looks good on postop day 1.  She is moving her legs well.  She states her pain is better than preop.  Encouraged mobilization today.  She has moved to a sitting position on the side of the bed once.

## 2020-05-16 ENCOUNTER — Inpatient Hospital Stay (HOSPITAL_COMMUNITY): Payer: Medicare Other

## 2020-05-16 MED FILL — Thrombin For Soln 5000 Unit: CUTANEOUS | Qty: 5000 | Status: AC

## 2020-05-16 NOTE — Care Management Important Message (Signed)
Important Message  Patient Details  Name: Karen Ramirez MRN: [id number] Date of Birth: 01-03-1950   Medicare Important Message Given:  Yes     [name] 05/16/2020, 3:35 PM

## 2020-05-16 NOTE — TOC Initial Note (Signed)
Transition of Care [facility]) - Initial/Assessment Note    Patient Details  Name: Karen Ramirez MRN: [id number] Date of Birth: [birth year]  [facility] CM/SW Contact:    Pollie Friar, RN Phone Number: 05/16/2020, 11:27 AM  Clinical Narrative:                 Recommendations are for SNF rehab. CM met with the patient and her spouse at the bedside. Pt was recently d/ced to [facility] rehab and would like to return there for rehab. CM has completed FL2 and faxed the referral to [facility].  Pt has been vaccinated and has had a booster.  TOC following.     Expected Discharge Plan: Skilled Nursing Facility Barriers to Discharge: Continued Medical Work up   Patient Goals and CMS Choice   CMS Medicare.gov Compare Post Acute Care list provided to:: Patient Choice offered to / list presented to : [facility]  Expected Discharge Plan and Services Expected Discharge Plan: [place] In-house Referral: Clinical Social Work Discharge Planning Services: CM Consult Post Acute Care Choice: [place] arrangements for the past 2 months: [place]                                      Prior Living Arrangements/Services Living arrangements for the past 2 months: Single Family Home Lives with:: Spouse Patient language and need for interpreter reviewed:: Yes Do you feel safe going back to the place where you live?: Yes      Need for Family Participation in Patient Care: Yes (Comment) Care giver support system in place?: No (comment)   Criminal Activity/Legal Involvement Pertinent to Current Situation/Hospitalization: No - Comment as needed  Activities of Daily Living      Permission Sought/Granted                  Emotional Assessment Appearance:: Appears stated age Attitude/Demeanor/Rapport: Engaged Affect (typically observed): Accepting Orientation: : Oriented to Self,Oriented to Place,Oriented to   Time,Oriented to Situation   Psych Involvement: No (comment)  Admission diagnosis:  Fatigue fracture of vertebra, lumbar region, sequela of fracture [M48.46XS] Patient Active Problem List   Diagnosis Date Noted  . Fatigue fracture of vertebra, lumbar region, sequela of fracture 05/14/2020  . Spondylolisthesis of lumbar region 04/05/2020  . Lumbar stenosis with neurogenic claudication 04/12/2015   PCP:  Adin Hector, MD Pharmacy:   [id number] - [place], [place] [address] [place] [address] Phone: [phone] Fax: [phone]     Social Determinants of Health (SDOH) Interventions    Readmission Risk Interventions No flowsheet data found.

## 2020-05-16 NOTE — NC FL2 (Signed)
[place] LEVEL OF CARE SCREENING TOOL     IDENTIFICATION  Patient Name: Karen Ramirez Birthdate: [birth year] Sex: female Admission Date (Current Location): 05/14/2020  [facility] and [place] Number:  Engineering geologist and Address:  [facility], [place] [address], [place], [place] [address]      Provider Number: [id number]  Attending Physician Name and Address:  Kristeen Miss, MD  Relative Name and Phone Number:       Current Level of Care: Hospital Recommended Level of Care: [place] Prior Approval Number:    Date Approved/Denied:   PASRR Number: [id number]  Discharge Plan: SNF    Current Diagnoses: Patient Active Problem List   Diagnosis Date Noted  . Fatigue fracture of vertebra, lumbar region, sequela of fracture 05/14/2020  . Spondylolisthesis of lumbar region 04/05/2020  . Lumbar stenosis with neurogenic claudication 04/12/2015    Orientation RESPIRATION BLADDER Height & Weight     Self,Time,Situation,Place  O2 (see d/c summary for oxygen needs) Continent Weight: 82.6 kg Height:  5' 2.52" (158.8 cm)  BEHAVIORAL SYMPTOMS/MOOD NEUROLOGICAL BOWEL NUTRITION STATUS      Continent Diet (Regular with thin liquids)  AMBULATORY STATUS COMMUNICATION OF NEEDS Skin   Extensive Assist Verbally Surgical wounds (honeycomb dressing to back)                       Personal Care Assistance Level of Assistance  Bathing,Feeding,Dressing Bathing Assistance: Limited assistance Feeding assistance: Independent Dressing Assistance: Limited assistance     Functional Limitations Info  Sight,Hearing,Speech Sight Info: Impaired Hearing Info: Adequate Speech Info: Adequate    SPECIAL CARE FACTORS FREQUENCY  PT (By licensed PT),OT (By licensed OT)     PT Frequency: 5x/wk OT Frequency: 5x/wk            Contractures Contractures Info: Not present    Additional Factors Info  Code Status,Allergies,Psychotropic  Code Status Info: Full Allergies Info: Azithromycin/ Levaquin/ Ultram Psychotropic Info: Valium 2.5 mg at bedtime/ Lyrica 75 mg three times a day         Current Medications (05/16/2020):  This is the current hospital active medication list Current Facility-Administered Medications  Medication Dose Route Frequency Provider Last Rate Last Admin  . 0.9 %  sodium chloride infusion  250 mL Intravenous Continuous Kristeen Miss, MD 1 mL/hr at 05/14/20 1604 250 mL at 05/14/20 1604  . acetaminophen (TYLENOL) tablet 650 mg  650 mg Oral Q4H PRN Kristeen Miss, MD       Or  . acetaminophen (TYLENOL) suppository 650 mg  650 mg Rectal Q4H PRN Kristeen Miss, MD      . bisacodyl (DULCOLAX) suppository 10 mg  10 mg Rectal Daily PRN Kristeen Miss, MD      . cholecalciferol (VITAMIN D3) tablet 5,000 Units  5,000 Units Oral Lina Sar, MD   5,000 Units at 05/16/20 [phone]  . dexamethasone (DECADRON) tablet 2 mg  2 mg Oral Q12H Kristeen Miss, MD   2 mg at 05/16/20 0929  . diazepam (VALIUM) tablet 2.5 mg  2.5 mg Oral Antoine Poche, MD   2.5 mg at 05/15/20 2115  . docusate sodium (COLACE) capsule 100 mg  100 mg Oral BID Kristeen Miss, MD      . famotidine (PEPCID) tablet 20 mg  20 mg Oral BID Camillo Flaming, MD   20 mg at 05/16/20 0558  . folic acid (FOLVITE) tablet 2 mg  2 mg Oral  Daily Kristeen Miss, MD   2 mg at 05/16/20 2979  . hydrochlorothiazide (MICROZIDE) capsule 12.5 mg  12.5 mg Oral Daily Kristeen Miss, MD   12.5 mg at 05/16/20 0929  . HYDROcodone-acetaminophen (NORCO/VICODIN) 5-325 MG per tablet 1-2 tablet  1-2 tablet Oral Q4H PRN Kristeen Miss, MD   2 tablet at 05/16/20 0556  . lactated ringers infusion   Intravenous Continuous Kristeen Miss, MD   Stopped at 05/14/20 2125  . levothyroxine (SYNTHROID) tablet 75 mcg  75 mcg Oral QAC breakfast Kristeen Miss, MD   75 mcg at 05/16/20 0556  . menthol-cetylpyridinium (CEPACOL) lozenge 3 mg  1 lozenge Oral PRN Kristeen Miss, MD       Or  . phenol  (CHLORASEPTIC) mouth spray 1 spray  1 spray Mouth/Throat PRN Kristeen Miss, MD      . methocarbamol (ROBAXIN) tablet 500 mg  500 mg Oral Q6H PRN Kristeen Miss, MD   500 mg at 05/16/20 0607   Or  . methocarbamol (ROBAXIN) 500 mg in dextrose 5 % 50 mL IVPB  500 mg Intravenous Q6H PRN Kristeen Miss, MD      . morphine 2 MG/ML injection 2-4 mg  2-4 mg Intravenous Q2H PRN Kristeen Miss, MD   2 mg at 05/14/20 2130  . ondansetron (ZOFRAN) tablet 4 mg  4 mg Oral Q6H PRN Kristeen Miss, MD       Or  . ondansetron (ZOFRAN) injection 4 mg  4 mg Intravenous Q6H PRN Kristeen Miss, MD      . polyethylene glycol (MIRALAX / GLYCOLAX) packet 17 g  17 g Oral Daily PRN Kristeen Miss, MD   17 g at 05/16/20 1003  . polyvinyl alcohol (LIQUIFILM TEARS) 1.4 % ophthalmic solution 1 drop  1 drop Both Eyes QHS Kristeen Miss, MD   1 drop at 05/15/20 2116  . pregabalin (LYRICA) capsule 75 mg  75 mg Oral TID Kristeen Miss, MD   75 mg at 05/16/20 0929  . senna (SENOKOT) tablet 8.6 mg  1 tablet Oral BID Kristeen Miss, MD      . simvastatin (ZOCOR) tablet 20 mg  20 mg Oral Antoine Poche, MD   20 mg at 05/15/20 2115  . sodium chloride flush (NS) 0.9 % injection 3 mL  3 mL Intravenous Q12H Kristeen Miss, MD   3 mL at 05/16/20 0932  . sodium chloride flush (NS) 0.9 % injection 3 mL  3 mL Intravenous PRN Kristeen Miss, MD      . sodium phosphate (FLEET) 7-19 GM/118ML enema 1 enema  1 enema Rectal Once PRN Kristeen Miss, MD      . triamcinolone (NASACORT) nasal inhaler 1 spray  1 spray Nasal QHS Kristeen Miss, MD   1 spray at 05/15/20 2116     Discharge Medications: Please see discharge summary for a list of discharge medications.  Relevant Imaging Results:  Relevant Lab Results:   Additional Information SSN [id number].  Pt is vaccinated and boosted for covid.  Pollie Friar, RN

## 2020-05-16 NOTE — Progress Notes (Signed)
Patient ID: Karen Ramirez, female   DOB: 1950-10-09, 70 y.o.   MRN: [id number] Vital signs are stable Patient has some pain in the right lower extremity which she did not have preoperatively Dressing is clean and dry We will obtain a new x-ray of the lumbar spine today Continues to do fair We will ask rehab medicine to see regarding comprehensive inpatient rehabilitation.

## 2020-05-16 NOTE — Progress Notes (Signed)
Physical Therapy Evaluation Patient Details  Name: Karen Ramirez MRN: [id number] DOB: January 07, 1950 Today's Date: 05/16/2020   History of Present Illness  Karen Ramirez is a 70 year old individual who on April 5 underwent decompression and fusion at L1-2 L2-3 and L3-4.  She struggled with significant postoperative pain and follow-up films demonstrate the presence of loss of fixation of the L4 vertebrae.  She developed a kyphotic angulation with retropulsion of bone.  She had severe left lumbar radicular pain. 05/14/20 s/p Lumbar decompression of L4 revision of hardware to include fixation to the pelvis from L1.  Clinical Impression  Pt was seen for mobility but has declined OOB.  Pt notes her pain is significant and is expecting to have imaging to see if anything else is going on with her back.  Completed ROM with cues and with regard to her back precautions.  Follow up with pt for goals of PT.  Reminders about back precautions are needed.     Follow Up Recommendations SNF    Equipment Recommendations  None recommended by PT    Recommendations for Other Services       Precautions / Restrictions Precautions Precautions: Back;Fall Precaution Booklet Issued: Yes (comment) Required Braces or Orthoses: Spinal Brace Spinal Brace: Lumbar corset Restrictions Weight Bearing Restrictions: No      Mobility  Bed Mobility Overal bed mobility: Needs Assistance Bed Mobility: Rolling                Transfers                 General transfer comment: declined OOB  Ambulation/Gait                Stairs            Wheelchair Mobility    Modified Rankin (Stroke Patients Only)       Balance                                             Pertinent Vitals/Pain Pain Assessment: 0-10 Pain Score: 6  Pain Location: BLE Pain Descriptors / Indicators: Aching Pain Intervention(s): Limited activity within patient's tolerance;Monitored during  session;Premedicated before session;Repositioned    Home Living                        Prior Function                 Hand Dominance        Extremity/Trunk Assessment                Communication      Cognition Arousal/Alertness: Awake/alert Behavior During Therapy: WFL for tasks assessed/performed Overall Cognitive Status: Within Functional Limits for tasks assessed                                        General Comments General comments (skin integrity, edema, etc.): on O2    Exercises General Exercises - Lower Extremity Ankle Circles/Pumps: AAROM;5 reps Quad Sets: AAROM;10 reps Gluteal Sets: AROM;10 reps Heel Slides: AROM;10 reps Hip ABduction/ADduction: AAROM;10 reps Hip Flexion/Marching: AAROM;10 reps   Assessment/Plan    PT Assessment    PT Problem List         PT Treatment Interventions  PT Goals (Current goals can be found in the Care Plan section)       Frequency Min 4X/week   Barriers to discharge        Co-evaluation               AM-PAC PT "6 Clicks" Mobility  Outcome Measure Help needed turning from your back to your side while in a flat bed without using bedrails?: A Little Help needed moving from lying on your back to sitting on the side of a flat bed without using bedrails?: A Little Help needed moving to and from a bed to a chair (including a wheelchair)?: A Lot Help needed standing up from a chair using your arms (e.g., wheelchair or bedside chair)?: A Lot Help needed to walk in hospital room?: A Lot Help needed climbing 3-5 steps with a railing? : Total 6 Click Score: 13    End of Session Equipment Utilized During Treatment: Oxygen Activity Tolerance: Patient tolerated treatment well Patient left: with call bell/phone within reach;with family/visitor present;in chair;with chair alarm set Nurse Communication: Mobility status PT Visit Diagnosis: Muscle weakness (generalized)  (M62.81);Difficulty in walking, not elsewhere classified (R26.2);Pain Pain - Right/Left: Right Pain - part of body: Hip    Time: 6948-5462 PT Time Calculation (min) (ACUTE ONLY): 17 min   Charges:     PT Treatments $Therapeutic Exercise: 8-22 mins       Ramond Dial 05/16/2020, 10:43 PM Mee Hives, PT MS Acute Rehab Dept. Number: [place] and [place]

## 2020-05-16 NOTE — Plan of Care (Signed)

## 2020-05-17 DIAGNOSIS — L8990 Pressure ulcer of unspecified site, unspecified stage: Secondary | ICD-10-CM | POA: Diagnosis present

## 2020-05-17 MED FILL — Heparin Sodium (Porcine) Inj 1000 Unit/ML: INTRAMUSCULAR | Qty: 30 | Status: AC

## 2020-05-17 MED FILL — Sodium Chloride IV Soln 0.9%: INTRAVENOUS | Qty: 1000 | Status: AC

## 2020-05-17 NOTE — Progress Notes (Signed)
Inpatient Rehab Admissions Coordinator:   Met with patient and her spouse at bedside to discuss option of CIR.  Explained expectations of 3 hrs/day of therapy, and average length of stay about 2 weeks.  Reviewed medicare coverage for CIR and answered their questions.  At this time, pt and spouse feel that pt would not tolerate CIR level intensity and also prefer proximity of [place] to their home.  Jacqualin Combes, RN CM, aware and CIr will sign off at this time.    Shann Medal, PT, DPT Admissions Coordinator [phone] 05/17/20  11:38 AM

## 2020-05-17 NOTE — Plan of Care (Signed)

## 2020-05-17 NOTE — TOC Progression Note (Signed)
Transition of Care [facility]) - Progression Note    Patient Details  Name: Karen Ramirez MRN: [id number] Date of Birth: 02-09-1950  [facility]) CM/SW Contact  Pollie Friar, RN Phone Number: 05/17/2020, 10:54 AM  Clinical Narrative:    MD has consulted CIR for eval.  Pt has been offered a bed at [facility] when medically ready.  TOC following.   Expected Discharge Plan: [place] Barriers to Discharge: Continued Medical Work up  Expected Discharge Plan and Services Expected Discharge Plan: [place] In-house Referral: Clinical Social Work Discharge Planning Services: CM Consult Post Acute Care Choice: [place] arrangements for the past 2 months: Single Family Home                                       Social Determinants of Health (SDOH) Interventions    Readmission Risk Interventions No flowsheet data found.

## 2020-05-17 NOTE — Progress Notes (Signed)
Patient ID: Karen Ramirez, female   DOB: 11-Aug-1950, 70 y.o.   MRN: [id number] Vital signs are stable Patient feels a bit tachycardic heart rate easily goes into the 100s with minimal stress Postoperative hemoglobin was noted to be 7.8 That was on the 15th I will check a new CBC tomorrow She is ready for transfer to [facility] for skilled nursing facility care if her CBC is okay Staples removed from the incision today incision is dressed with dry gauze and Betadine swab stick. Urinary retention also noted to be still an issue

## 2020-05-17 NOTE — Progress Notes (Signed)
Patient was encouraged to pull on SCDs but patient refused, explaining that it hurts her leg/hip muscles. Patient education was given.

## 2020-05-17 NOTE — Progress Notes (Signed)
Physical Therapy Treatment Patient Details Name: Karen Ramirez MRN: [id number] DOB: [birth year] Today's Date: 05/17/2020    History of Present Illness Karen Ramirez is a 70 year old individual who on April 5 underwent decompression and fusion at L1-2 L2-3 and L3-4.  She struggled with significant postoperative pain and follow-up films demonstrate the presence of loss of fixation of the L4 vertebrae.  She developed a kyphotic angulation with retropulsion of bone.  She had severe left lumbar radicular pain. 05/14/20 s/p Lumbar decompression of L4 revision of hardware to include fixation to the pelvis from L1.    PT Comments    Pt required min assist bed mobility, +2 mod/max assist sit to stand with RW, and +2 mod assist SPT with RW. Attempted gait trial from recliner. Pt unable due to BLE. She reports it is time for her pain meds, having last dose 5.5 hours prior to therapy session. Pt assisted with positioning in recliner for comfort. Husband present in room.    Follow Up Recommendations  SNF     Equipment Recommendations  None recommended by PT    Recommendations for Other Services       Precautions / Restrictions Precautions Precautions: Back;Fall Precaution Comments: reviewed back precautions Required Braces or Orthoses: Spinal Brace Spinal Brace: Lumbar corset;Applied in sitting position    Mobility  Bed Mobility Overal bed mobility: Needs Assistance Bed Mobility: Rolling;Sidelying to Sit Rolling: Min assist Sidelying to sit: Min assist;HOB elevated       General bed mobility comments: cues for sequencing and logroll    Transfers Overall transfer level: Needs assistance Equipment used: Rolling walker (2 wheeled) Transfers: Sit to/from [facility] Sit to Stand: +2 physical assistance;Mod assist;Max assist;From elevated surface Stand pivot transfers: Mod assist;+2 physical assistance       General transfer comment: cues for hand placement and  sequencing, assist to power up and stabilize balance, able to take pivot steps bed to recliner with RW  Ambulation/Gait             General Gait Details: after transfer to recliner, attempted gait trial. Pt unable to power up from recliner for stand due to RLE pain. Attempted x 2.   Stairs             Wheelchair Mobility    Modified Rankin (Stroke Patients Only)       Balance Overall balance assessment: Needs assistance Sitting-balance support: Feet supported;No upper extremity supported Sitting balance-Leahy Scale: Fair     Standing balance support: Bilateral upper extremity supported;During functional activity Standing balance-Leahy Scale: Poor Standing balance comment: reliant on external support                            Cognition Arousal/Alertness: Awake/alert Behavior During Therapy: WFL for tasks assessed/performed Overall Cognitive Status: Within Functional Limits for tasks assessed                                        Exercises General Exercises - Lower Extremity Ankle Circles/Pumps: AROM;Both;10 reps;Supine Long Arc Quad: AAROM;Right;5 reps;Seated    General Comments General comments (skin integrity, edema, etc.): VSS on 2L      Pertinent Vitals/Pain Pain Assessment: Faces Faces Pain Scale: Hurts whole lot Pain Location: BLE Pain Descriptors / Indicators: Grimacing;Guarding;Sore Pain Intervention(s): Limited activity within patient's tolerance;Monitored during session;Repositioned    Home Living  Prior Function            PT Goals (current goals can now be found in the care plan section) Acute Rehab PT Goals Patient Stated Goal: to get stronger and return home after SNF Progress towards PT goals: Progressing toward goals    Frequency    Min 4X/week      PT Plan Current plan remains appropriate    Co-evaluation              AM-PAC PT "6 Clicks" Mobility    Outcome Measure  Help needed turning from your back to your side while in a flat bed without using bedrails?: A Little Help needed moving from lying on your back to sitting on the side of a flat bed without using bedrails?: A Little Help needed moving to and from a bed to a chair (including a wheelchair)?: A Lot Help needed standing up from a chair using your arms (e.g., wheelchair or bedside chair)?: A Lot Help needed to walk in hospital room?: A Lot Help needed climbing 3-5 steps with a railing? : Total 6 Click Score: 13    End of Session Equipment Utilized During Treatment: Oxygen;Gait belt;Back brace Activity Tolerance: Patient tolerated treatment well Patient left: in chair;with call bell/phone within reach;with family/visitor present Nurse Communication: Mobility status PT Visit Diagnosis: Muscle weakness (generalized) (M62.81);Difficulty in walking, not elsewhere classified (R26.2);Pain     Time: 0350-0938 PT Time Calculation (min) (ACUTE ONLY): 16 min  Charges:  $Therapeutic Activity: 8-22 mins                     Lorrin Goodell, PT  Office # [phone] Pager [phone]    Lorriane Shire 05/17/2020, 1:20 PM

## 2020-05-18 ENCOUNTER — Encounter (HOSPITAL_COMMUNITY): Payer: Self-pay | Admitting: Neurological Surgery

## 2020-05-18 LAB — CBC
HCT: 23.6 % — ABNORMAL LOW (ref 36.0–46.0)
HCT: 29.1 % — ABNORMAL LOW (ref 36.0–46.0)
Hemoglobin: 7.3 g/dL — ABNORMAL LOW (ref 12.0–15.0)
Hemoglobin: 9.1 g/dL — ABNORMAL LOW (ref 12.0–15.0)
MCH: 29 pg (ref 26.0–34.0)
MCH: 28.7 pg (ref 26.0–34.0)
MCHC: 30.9 g/dL (ref 30.0–36.0)
MCHC: 31.3 g/dL (ref 30.0–36.0)
MCV: 93.7 fL (ref 80.0–100.0)
MCV: 91.8 fL (ref 80.0–100.0)
Platelets: 206 10*3/uL (ref 150–400)
Platelets: 197 10*3/uL (ref 150–400)
RBC: 2.52 MIL/uL — ABNORMAL LOW (ref 3.87–5.11)
RBC: 3.17 MIL/uL — ABNORMAL LOW (ref 3.87–5.11)
RDW: 14.8 % (ref 11.5–15.5)
RDW: 14.8 % (ref 11.5–15.5)
WBC: 9.3 10*3/uL (ref 4.0–10.5)
WBC: 9.2 10*3/uL (ref 4.0–10.5)
nRBC: 0.4 % — ABNORMAL HIGH (ref 0.0–0.2)
nRBC: 1.6 % — ABNORMAL HIGH (ref 0.0–0.2)

## 2020-05-18 LAB — SARS CORONAVIRUS 2 (TAT 6-24 HRS): SARS Coronavirus 2: NEGATIVE

## 2020-05-18 LAB — PREPARE RBC (CROSSMATCH)

## 2020-05-18 MED ORDER — SODIUM CHLORIDE 0.9% IV SOLUTION: Freq: Once | INTRAVENOUS | Status: AC

## 2020-05-18 NOTE — TOC Progression Note (Signed)
Transition of Care [facility]) - Progression Note    Patient Details  Name: Karen Ramirez MRN: [id number] Date of Birth: 05-23-1950  Transition of Care [facility]) CM/SW Contact  Pollie Friar, RN Phone Number: 05/18/2020, 1:50 PM  Clinical Narrative:    Pt with a drop in Hgb today. Per MD note will plan for d/c tomorrow to [facility]. Covid test ordered for admission to SNF.  TOC following.   Expected Discharge Plan: [place] Barriers to Discharge: Continued Medical Work up  Expected Discharge Plan and Services Expected Discharge Plan: [place] In-house Referral: Clinical Social Work Discharge Planning Services: CM Consult Post Acute Care Choice: [place] arrangements for the past 2 months: Single Family Home                                       Social Determinants of Health (SDOH) Interventions    Readmission Risk Interventions No flowsheet data found.

## 2020-05-18 NOTE — Progress Notes (Signed)
Blood transfusion just completed, no s/s of tramsfusion reaction, pt verbalizes of feeling better, breathing better, tolerated well, and will continue to monitor. 

## 2020-05-18 NOTE — Progress Notes (Signed)
Occupational Therapy Treatment Patient Details Name: Karen Ramirez MRN: [id number] DOB: 1950/06/08 Today's Date: 05/18/2020    History of present illness Karen Ramirez is a 70 year old individual who on April 5 underwent decompression and fusion at L1-2 L2-3 and L3-4.  She struggled with significant postoperative pain and follow-up films demonstrate the presence of loss of fixation of the L4 vertebrae.  She developed a kyphotic angulation with retropulsion of bone.  She had severe left lumbar radicular pain. 05/14/20 s/p Lumbar decompression of L4 revision of hardware to include fixation to the pelvis from L1.   OT comments  Pt in bed upon arrival, agreeable to OT/PT session. Pt currently requires modA+2 for sit<>stand and minA+2 at RW level for stand-pivot transfer to recliner in preparation for toilet transfers. Pt with tachy HR during mobility HR 144bpm, pt returned to HR 110s after sitting for 3 min and pursed lip breathing. SpO2 >90% throughout session on 2lnc, continued to reinforce pursed lip breathing. Pt will continue to benefit from skilled OT services to maximize safety and independence with ADL/IADL and functional mobility. Will continue to follow acutely and progress as tolerated.    Follow Up Recommendations  SNF    Equipment Recommendations  3 in 1 bedside commode    Recommendations for Other Services      Precautions / Restrictions Precautions Precautions: Back;Fall Precaution Booklet Issued: Yes (comment) Precaution Comments: reviewed back precautions Required Braces or Orthoses: Spinal Brace Spinal Brace: Lumbar corset;Applied in sitting position Restrictions Weight Bearing Restrictions: No       Mobility Bed Mobility Overal bed mobility: Needs Assistance Bed Mobility: Rolling;Sidelying to Sit Rolling: Supervision Sidelying to sit: HOB elevated;Min guard       General bed mobility comments: minguard to progress trunk to upright position with HOB  elevated    Transfers Overall transfer level: Needs assistance Equipment used: Rolling walker (2 wheeled) Transfers: Sit to/from [facility] Sit to Stand: Mod assist;+2 physical assistance;+2 safety/equipment Stand pivot transfers: Min assist;+2 safety/equipment;+2 physical assistance       General transfer comment: modA to powerup into standing, minA for stand pivot to recliner    Balance Overall balance assessment: Needs assistance Sitting-balance support: Feet supported;No upper extremity supported Sitting balance-Leahy Scale: Fair Sitting balance - Comments: sat EOB independently   Standing balance support: Bilateral upper extremity supported;During functional activity Standing balance-Leahy Scale: Poor Standing balance comment: reliant on external support                           ADL either performed or assessed with clinical judgement   ADL Overall ADL's : Needs assistance/impaired     Grooming: Set up;Sitting           Upper Body Dressing : Minimal assistance Upper Body Dressing Details (indicate cue type and reason): wtih donning back brace Lower Body Dressing: Moderate assistance;Sit to/from stand;+2 for safety/equipment;+2 for physical assistance Lower Body Dressing Details (indicate cue type and reason): to access Bilateral feet and power up into standing Toilet Transfer: Minimal assistance;Stand-pivot;+2 for physical assistance;+2 for safety/equipment Toilet Transfer Details (indicate cue type and reason): simulated to [place] and Hygiene: Sit to/from stand;Moderate assistance;+2 for physical assistance;+2 for safety/equipment Toileting - Clothing Manipulation Details (indicate cue type and reason): modA+2 to powerup into standing     Functional mobility during ADLs: Moderate assistance;Rolling walker;+2 for physical assistance;+2 for safety/equipment General ADL Comments: pt continues to be  limited by BLE weakness, pain  and decreased activity tolerance with cardiopulmonary limitations: HR 144bpm with stand pivot transfer, SpO2 >90% on 2lnc throughout session, pt frequently mouth breathing     Vision       Perception     Praxis      Cognition Arousal/Alertness: Awake/alert Behavior During Therapy: WFL for tasks assessed/performed Overall Cognitive Status: Within Functional Limits for tasks assessed                                          Exercises     Shoulder Instructions       General Comments HR tachy with stand-pivot HR 144bpm returned to 110s after return to sitting in recliner with pursed lip breathing for 2 min    Pertinent Vitals/ Pain       Pain Assessment: Faces Faces Pain Scale: Hurts little more Pain Location: BLE Pain Descriptors / Indicators: Grimacing;Guarding;Sore Pain Intervention(s): Monitored during session;Limited activity within patient's tolerance  Home Living                                          Prior Functioning/Environment              Frequency  Min 2X/week        Progress Toward Goals  OT Goals(current goals can now be found in the care plan section)  Progress towards OT goals: Progressing toward goals  Acute Rehab OT Goals Patient Stated Goal: to get stronger and return home after SNF OT Goal Formulation: With patient Time For Goal Achievement: 05/29/20 Potential to Achieve Goals: Good ADL Goals Pt Will Perform Upper Body Dressing: with modified independence;sitting Pt Will Transfer to Toilet: with min assist;stand pivot transfer Additional ADL Goal #1: Pt will complete bed mobility at modified independent level in preparation for ADL and functional mobility.  Plan Discharge plan remains appropriate    Co-evaluation    PT/OT/SLP Co-Evaluation/Treatment: Yes Reason for Co-Treatment: For patient/therapist safety;To address functional/ADL transfers   OT goals addressed  during session: ADL's and self-care      AM-PAC OT "6 Clicks" Daily Activity     Outcome Measure   Help from another person eating meals?: A Little Help from another person taking care of personal grooming?: A Little Help from another person toileting, which includes using toliet, bedpan, or urinal?: A Lot Help from another person bathing (including washing, rinsing, drying)?: A Lot Help from another person to put on and taking off regular upper body clothing?: A Little Help from another person to put on and taking off regular lower body clothing?: A Lot 6 Click Score: 15    End of Session Equipment Utilized During Treatment: Gait belt;Back brace;Rolling walker  OT Visit Diagnosis: Unsteadiness on feet (R26.81);Other abnormalities of gait and mobility (R26.89);Muscle weakness (generalized) (M62.81);Pain Pain - part of body:  (back and BLE)   Activity Tolerance Patient tolerated treatment well   Patient Left in chair;with call bell/phone within reach (pt verbalized she will not get up alone, RN approved no chair alarm)   Nurse Communication Mobility status        Time: 8119-1478 OT Time Calculation (min): 28 min  Charges: OT General Charges $OT Visit: 1 Visit OT Treatments $Self Care/Home Management : 8-22 mins  Helene Kelp OTR/L [place] Office: [phone]  Wyn Forster 05/18/2020, 4:11 PM

## 2020-05-18 NOTE — Plan of Care (Signed)

## 2020-05-18 NOTE — Progress Notes (Signed)
Patient ID: Karen Ramirez, female   DOB: 19-Dec-1950, 70 y.o.   MRN: [id number] Vital signs are stable Postoperative hemoglobin has decreased now to seven-point 3/2 a gram last than it was 2 days ago I have advised a 2 unit blood transfusion She is currently getting the first unit of packed cells Second unit will be released until posttransfusion hematocrit is checked May only get 1 unit of packed cells but I am hopeful for her discharge to rehab tomorrow

## 2020-05-18 NOTE — Progress Notes (Signed)
Physical Therapy Treatment Patient Details Name: Karen Ramirez MRN: [id number] DOB: 05-15-1950 Today's Date: 05/18/2020    History of Present Illness Karen Ramirez is a 70 year old individual who on April 5 underwent decompression and fusion at L1-2 L2-3 and L3-4.  She struggled with significant postoperative pain and follow-up films demonstrate the presence of loss of fixation of the L4 vertebrae.  She developed a kyphotic angulation with retropulsion of bone.  She had severe left lumbar radicular pain. 05/14/20 s/p Lumbar decompression of L4 revision of hardware to include fixation to the pelvis from L1.    PT Comments    Patient agreeable to PT/OT session. Patient on 3L O2 via [place] with spO2 >90% throughout. Patient with symptomatic anemia throughout. Patient required modA+2 for sit to stand from elevated surface. Patient able to complete stand pivot transfer with minA+2 and RW. Patient with tachy HR during mobility (144 bpm) with return to 110s after 3 minute seated rest break. Educated patient on pursed lip breathing due to noted SOB. Continue to recommend SNF for ongoing Physical Therapy.       Follow Up Recommendations  SNF     Equipment Recommendations  None recommended by PT    Recommendations for Other Services       Precautions / Restrictions Precautions Precautions: Back;Fall Precaution Booklet Issued: Yes (comment) Precaution Comments: reviewed back precautions Required Braces or Orthoses: Spinal Brace Spinal Brace: Lumbar corset;Applied in sitting position Restrictions Weight Bearing Restrictions: No    Mobility  Bed Mobility Overal bed mobility: Needs Assistance Bed Mobility: Rolling;Sidelying to Sit Rolling: Supervision Sidelying to sit: HOB elevated;Min guard       General bed mobility comments: minguard to progress trunk to upright position with HOB elevated    Transfers Overall transfer level: Needs assistance Equipment used: Rolling Celsa Nordahl (2  wheeled) Transfers: Sit to/from [facility] Sit to Stand: Mod assist;+2 physical assistance;+2 safety/equipment;From elevated surface Stand pivot transfers: Min assist;+2 safety/equipment;+2 physical assistance       General transfer comment: modA+2 for power up into standing from elevated surface with cues for hand placement. MinA+2 for stand pivot transfer  Ambulation/Gait             General Gait Details: deferred due to symptomatic anemia   Stairs             Wheelchair Mobility    Modified Rankin (Stroke Patients Only)       Balance Overall balance assessment: Needs assistance Sitting-balance support: Feet supported;No upper extremity supported Sitting balance-Leahy Scale: Fair Sitting balance - Comments: sat EOB independently   Standing balance support: Bilateral upper extremity supported;During functional activity Standing balance-Leahy Scale: Poor Standing balance comment: reliant on external support                            Cognition Arousal/Alertness: Awake/alert Behavior During Therapy: WFL for tasks assessed/performed Overall Cognitive Status: Within Functional Limits for tasks assessed                                        Exercises      General Comments General comments (skin integrity, edema, etc.): HR tachy with stand-pivot HR 144bpm returned to 110s after return to sitting in recliner with pursed lip breathing for 2 min. Patient on 3L O2 via [place] with spO2 maintaining >90% throughout  Pertinent Vitals/Pain Pain Assessment: Faces Faces Pain Scale: Hurts little more Pain Location: BLE Pain Descriptors / Indicators: Grimacing;Guarding;Sore Pain Intervention(s): Limited activity within patient's tolerance;Monitored during session    Home Living                      Prior Function            PT Goals (current goals can now be found in the care plan section) Acute Rehab PT  Goals Patient Stated Goal: to get stronger and return home after SNF PT Goal Formulation: With patient Time For Goal Achievement: 05/28/20 Potential to Achieve Goals: Good Progress towards PT goals: Progressing toward goals    Frequency    Min 4X/week      PT Plan Current plan remains appropriate    Co-evaluation PT/OT/SLP Co-Evaluation/Treatment: Yes Reason for Co-Treatment: For patient/therapist safety;To address functional/ADL transfers PT goals addressed during session: Mobility/safety with mobility;Balance;Proper use of DME OT goals addressed during session: ADL's and self-care      AM-PAC PT "6 Clicks" Mobility   Outcome Measure  Help needed turning from your back to your side while in a flat bed without using bedrails?: A Little Help needed moving from lying on your back to sitting on the side of a flat bed without using bedrails?: A Little Help needed moving to and from a bed to a chair (including a wheelchair)?: A Little Help needed standing up from a chair using your arms (e.g., wheelchair or bedside chair)?: A Lot Help needed to walk in hospital room?: A Lot Help needed climbing 3-5 steps with a railing? : Total 6 Click Score: 14    End of Session Equipment Utilized During Treatment: Gait belt;Back brace;Oxygen Activity Tolerance: Patient tolerated treatment well Patient left: in chair;with call bell/phone within reach Nurse Communication: Mobility status PT Visit Diagnosis: Muscle weakness (generalized) (M62.81);Difficulty in walking, not elsewhere classified (R26.2);Pain     Time: 3810-1751 PT Time Calculation (min) (ACUTE ONLY): 24 min  Charges:  $Therapeutic Activity: 8-22 mins                     Sawyer Kahan A. Gilford Rile PT, DPT Acute Rehabilitation Services Pager [phone] Office [phone]    Linna Hoff 05/18/2020, 5:20 PM

## 2020-05-19 ENCOUNTER — Inpatient Hospital Stay (HOSPITAL_COMMUNITY): Payer: Medicare Other

## 2020-05-19 ENCOUNTER — Encounter (HOSPITAL_COMMUNITY): Payer: Medicare Other

## 2020-05-19 DIAGNOSIS — I10 Essential (primary) hypertension: Secondary | ICD-10-CM

## 2020-05-19 DIAGNOSIS — J9601 Acute respiratory failure with hypoxia: Secondary | ICD-10-CM

## 2020-05-19 DIAGNOSIS — M7989 Other specified soft tissue disorders: Secondary | ICD-10-CM

## 2020-05-19 DIAGNOSIS — R7989 Other specified abnormal findings of blood chemistry: Secondary | ICD-10-CM

## 2020-05-19 DIAGNOSIS — D62 Acute posthemorrhagic anemia: Secondary | ICD-10-CM

## 2020-05-19 LAB — TYPE AND SCREEN
ABO/RH(D): O POS
Antibody Screen: NEGATIVE
Unit division: 0

## 2020-05-19 LAB — HEMOGLOBIN AND HEMATOCRIT, BLOOD
HCT: 33.8 % — ABNORMAL LOW (ref 36.0–46.0)
HCT: 30.8 % — ABNORMAL LOW (ref 36.0–46.0)
Hemoglobin: 10.6 g/dL — ABNORMAL LOW (ref 12.0–15.0)
Hemoglobin: 9.7 g/dL — ABNORMAL LOW (ref 12.0–15.0)

## 2020-05-19 LAB — BPAM RBC
Blood Product Expiration Date: 202206142359
ISSUE DATE / TIME: 202205181548
Unit Type and Rh: 5100

## 2020-05-19 LAB — D-DIMER, QUANTITATIVE: D-Dimer, Quant: 16.44 ug/mL-FEU — ABNORMAL HIGH (ref 0.00–0.50)

## 2020-05-19 LAB — TROPONIN I (HIGH SENSITIVITY): Troponin I (High Sensitivity): 49 ng/L — ABNORMAL HIGH (ref <18)

## 2020-05-19 LAB — BRAIN NATRIURETIC PEPTIDE: B Natriuretic Peptide: 666.3 pg/mL — ABNORMAL HIGH (ref 0.0–100.0)

## 2020-05-19 MED ORDER — CHLORHEXIDINE GLUCONATE CLOTH 2 % EX PADS
6.0000 | MEDICATED_PAD | Freq: Every day | CUTANEOUS | Status: DC
  Administered 2020-05-19 – 2020-05-22 (×3): 6 via TOPICAL

## 2020-05-19 MED ORDER — PREGABALIN 75 MG PO CAPS: 75.0000 mg | ORAL_CAPSULE | Freq: Three times a day (TID) | ORAL | 3 refills | Status: DC

## 2020-05-19 MED ORDER — POLYETHYLENE GLYCOL 3350 17 G PO PACK
17.0000 g | PACK | Freq: Every day | ORAL | Status: DC
  Administered 2020-05-19 – 2020-05-22 (×4): 17 g via ORAL
  Filled 2020-05-19 (×4): qty 1

## 2020-05-19 MED ORDER — DEXAMETHASONE 1 MG PO TABS: ORAL_TABLET | ORAL | 0 refills | Status: DC

## 2020-05-19 MED ORDER — DIAZEPAM 5 MG PO TABS: 2.5000 mg | ORAL_TABLET | Freq: Every day | ORAL | 0 refills | Status: DC

## 2020-05-19 MED ORDER — ENOXAPARIN SODIUM 80 MG/0.8ML IJ SOSY
80.0000 mg | PREFILLED_SYRINGE | Freq: Two times a day (BID) | INTRAMUSCULAR | Status: DC
  Administered 2020-05-19 – 2020-05-20 (×3): 80 mg via SUBCUTANEOUS
  Filled 2020-05-19 (×3): qty 0.8

## 2020-05-19 MED ORDER — FUROSEMIDE 10 MG/ML IJ SOLN
40.0000 mg | Freq: Once | INTRAMUSCULAR | Status: AC
  Administered 2020-05-19: 40 mg via INTRAVENOUS
  Filled 2020-05-19: qty 4

## 2020-05-19 MED ORDER — HYDROCODONE-ACETAMINOPHEN 5-325 MG PO TABS: 1.0000 | ORAL_TABLET | ORAL | 0 refills | Status: DC | PRN

## 2020-05-19 MED ORDER — TECHNETIUM TO 99M ALBUMIN AGGREGATED
4.0000 | Freq: Once | INTRAVENOUS | Status: AC | PRN
  Administered 2020-05-19: 4 via INTRAVENOUS

## 2020-05-19 MED ORDER — METHOCARBAMOL 500 MG PO TABS: 500.0000 mg | ORAL_TABLET | Freq: Four times a day (QID) | ORAL | 3 refills | Status: DC | PRN

## 2020-05-19 NOTE — Progress Notes (Signed)
Physical Therapy Treatment Patient Details Name: Karen Ramirez MRN: [id number] DOB: [birth year] Today's Date: 05/19/2020    History of Present Illness Karen Ramirez is a 70 year old individual who on April 5 underwent decompression and fusion at L1-2 L2-3 and L3-4.  She struggled with significant postoperative pain and follow-up films demonstrate the presence of loss of fixation of the L4 vertebrae.  She developed a kyphotic angulation with retropulsion of bone.  She had severe left lumbar radicular pain. 05/14/20 s/p Lumbar decompression of L4 revision of hardware to include fixation to the pelvis from L1.    PT Comments    Patient limited this session by fatigue, nausea, and increased SOB. Patient minA for bed mobility due to fatigue and weakness. Patient on 3L O2 via [place] with spO2 drop to 87% upon sitting EOB but increased to 92% with pursed lip breathing. Patient with difficulty with PLB due to mouth breathing. Continue to recommend SNF for ongoing Physical Therapy.       Follow Up Recommendations  SNF     Equipment Recommendations  None recommended by PT    Recommendations for Other Services       Precautions / Restrictions Precautions Precautions: Back;Fall Precaution Booklet Issued: Yes (comment) Precaution Comments: reviewed back precautions Required Braces or Orthoses: Spinal Brace Spinal Brace: Lumbar corset Restrictions Weight Bearing Restrictions: No    Mobility  Bed Mobility Overal bed mobility: Needs Assistance Bed Mobility: Rolling;Sidelying to Sit;Sit to Sidelying Rolling: Min guard Sidelying to sit: Min assist;HOB elevated     Sit to sidelying: Min assist General bed mobility comments: min guard to roll to R. MinA for bringing LEs on and off bed    Transfers                 General transfer comment: deferred due to nausea, dizziness, and increasing SOB  Ambulation/Gait                 Stairs             Wheelchair  Mobility    Modified Rankin (Stroke Patients Only)       Balance Overall balance assessment: Needs assistance Sitting-balance support: Feet supported;No upper extremity supported Sitting balance-Leahy Scale: Fair Sitting balance - Comments: sat EOB independently                                    Cognition Arousal/Alertness: Awake/alert Behavior During Therapy: WFL for tasks assessed/performed Overall Cognitive Status: Within Functional Limits for tasks assessed                                        Exercises      General Comments General comments (skin integrity, edema, etc.): On 3L O2 via [place] with spO2 dropped to 87% sitting EOB. Instructed on pursed lip breathing with increase to 92%. Difficulty following instructions due to mouth breathing      Pertinent Vitals/Pain Pain Assessment: 0-10 Pain Score: 7  Pain Location: back and R LE Pain Descriptors / Indicators: Grimacing;Guarding;Sore Pain Intervention(s): Monitored during session;Repositioned    Home Living                      Prior Function            PT Goals (current goals can now be  found in the care plan section) Acute Rehab PT Goals Patient Stated Goal: to feel better and go to rehab PT Goal Formulation: With patient Time For Goal Achievement: 05/28/20 Potential to Achieve Goals: Good Progress towards PT goals: Progressing toward goals    Frequency    Min 4X/week      PT Plan Current plan remains appropriate    Co-evaluation              AM-PAC PT "6 Clicks" Mobility   Outcome Measure  Help needed turning from your back to your side while in a flat bed without using bedrails?: A Little Help needed moving from lying on your back to sitting on the side of a flat bed without using bedrails?: A Little Help needed moving to and from a bed to a chair (including a wheelchair)?: A Little Help needed standing up from a chair using your arms (e.g.,  wheelchair or bedside chair)?: A Lot Help needed to walk in hospital room?: A Lot Help needed climbing 3-5 steps with a railing? : Total 6 Click Score: 14    End of Session Equipment Utilized During Treatment: Oxygen Activity Tolerance: Patient tolerated treatment well Patient left: in bed;with bed alarm set;with call bell/phone within reach;with family/visitor present Nurse Communication: Mobility status PT Visit Diagnosis: Muscle weakness (generalized) (M62.81);Difficulty in walking, not elsewhere classified (R26.2);Pain     Time: 0109-3235 PT Time Calculation (min) (ACUTE ONLY): 23 min  Charges:  $Therapeutic Activity: 23-37 mins                     Karen Ramirez A. Gilford Rile PT, DPT Acute Rehabilitation Services Pager [phone] Office [phone]    Linna Hoff 05/19/2020, 10:29 AM

## 2020-05-19 NOTE — Progress Notes (Signed)
Pharmacy Consult - Lovenox  Starting Lovenox for rule out PE post neurosurgery   Plan: Lovenox 80 mg sq Q 12 hours Follow CBC, imaging studies  Thank you Anette Guarneri, PharmD

## 2020-05-19 NOTE — Discharge Summary (Signed)
Physician Discharge Summary  Patient ID: Karen Ramirez MRN: [id number] DOB/AGE: 70-Aug-1952 70 y.o.  Admit date: 05/14/2020 Discharge date: 05/19/2020  Admission Diagnoses: L4 fracture with loss of fixation L1-L4  Discharge Diagnoses: L4 fracture with loss of fixation of L1-L4 decompression stabilization procedure.  Lumbar radiculopathy.  Acute blood loss anemia.  Pressure injury of skin. 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  She had previous decompression and fusion from L1-L4 and had loss of fixation secondary to fracture through the pedicle and vertebral body of the L4 screws.  The decompression was performed with a laminectomy and fixation was then obtained to the iliac crest through L5 and the sacrum.  Posterolateral arthrodesis was performed using autograft and allograft.  She tolerated surgery well however during the postoperative period it was noted that she had greater than 3 g loss of hemoglobin with her final hemoglobin being 7.3.  Because of the fact patient felt weak and listless we transfused her 1 unit of packed red cells and a post transfusion hemoglobin is 9.1.  She is now ready for discharge to a skilled nursing facility to undergo further rehabilitation and get her strength back.  Consults: None  Significant Diagnostic Studies: None  Treatments: surgery: See op note  Discharge Exam: Blood pressure 117/78, pulse 97, temperature 98 F (36.7 C), temperature source Oral, resp. rate 19, height 5' 2.52" (1.588 m), weight 82.6 kg, SpO2 (!) 89 %. Incision is clean and dry motor is strength is good in the lower extremities mild weakness in the right lower extremity in the iliopsoas and quad  Disposition: Discharge disposition: 03-Skilled Nursing  Facility       Discharge Instructions    Call MD for:  redness, tenderness, or signs of infection (pain, swelling, redness, odor or green/yellow discharge around incision site)   Complete by: As directed    Call MD for:  severe uncontrolled pain   Complete by: As directed    Call MD for:  temperature >100.4   Complete by: As directed    Diet - low sodium heart healthy   Complete by: As directed    Incentive spirometry RT   Complete by: As directed    Increase activity slowly   Complete by: As directed    No dressing needed   Complete by: As directed      Allergies as of 05/19/2020      Reactions   Azithromycin Diarrhea   Levaquin [levofloxacin] Nausea And Vomiting   Ultram [tramadol] Nausea And Vomiting      Medication List    TAKE these medications   carboxymethylcellulose 0.5 % Soln Commonly known as: REFRESH PLUS Place 1 drop into both eyes at bedtime.   celecoxib 200 MG capsule Commonly known as: CELEBREX Take 200 mg by mouth in the morning and at bedtime.   dexamethasone 1 MG tablet Commonly known as: DECADRON 2 tablets twice daily for 2 days, one tablet twice daily for 2 days, one tablet daily for 2 days.   diazepam 5 MG tablet Commonly known as: VALIUM Take 2.5 mg by mouth at bedtime.   famotidine 20 MG tablet Commonly known as: PEPCID Take 20 mg by mouth 2 (two) times daily before a meal.   folic acid 1 MG tablet Commonly known as: FOLVITE Take  2 mg by mouth daily.   hydrochlorothiazide 12.5 MG tablet Commonly known as: HYDRODIURIL Take 12.5 mg by mouth daily.   HYDROcodone-acetaminophen 5-325 MG tablet Commonly known as: NORCO/VICODIN Take 1-2 tablets by mouth every 4 (four) hours as needed for moderate pain.   levothyroxine 75 MCG tablet Commonly known as: SYNTHROID Take 75 mcg by mouth daily before breakfast.   methocarbamol 500 MG tablet Commonly known as: ROBAXIN Take 1 tablet (500 mg total) by mouth every 6 (six) hours as needed for  muscle spasms. What changed: Another medication with the same name was added. Make sure you understand how and when to take each.   methocarbamol 500 MG tablet Commonly known as: ROBAXIN Take 1 tablet (500 mg total) by mouth every 6 (six) hours as needed for muscle spasms. What changed: You were already taking a medication with the same name, and this prescription was added. Make sure you understand how and when to take each.   predniSONE 10 MG tablet Commonly known as: DELTASONE Take 10 mg by mouth in the morning.   pregabalin 75 MG capsule Commonly known as: LYRICA Take 75 mg by mouth 3 (three) times daily.   simvastatin 20 MG tablet Commonly known as: ZOCOR Take 20 mg by mouth at bedtime.   triamcinolone 55 MCG/ACT Aero nasal inhaler Commonly known as: NASACORT Place 1 spray into the nose at bedtime.   Vitamin D3 125 MCG (5000 UT) Tabs Take 5,000 Units by mouth every other day.            Discharge Care Instructions  (From admission, onward)         Start     Ordered   05/19/20 0000  No dressing needed        05/19/20 0759          Contact information for after-discharge care    Destination    [facility] .   Service: Skilled Nursing Contact information: [place] [phone]                  Signed: Earleen Newport 05/19/2020, 7:59 AM

## 2020-05-19 NOTE — Consult Note (Addendum)
[facility] Medical Consultation  Karen DEHNERT WGN:[id number] DOB: [birth year] DOA: 05/14/2020 PCP: Adin Hector, MD   Requesting physician: Kristeen Miss, MD Date of consultation: 05/19/2020 Reason for consultation: Medical management  Impression/Recommendations  1. L4 fracture with loss of fixation: Patient postop day 5 s/p revision surgery to compress L4 and perform fixation to the pelvis to stabilize the mid lumbar spine. -Continue current pain regimen -Per neurosurgery  2.  Acute respiratory failure with hypoxia: Patient was noted to be hypoxic into the upper 80s.  Currently maintaining O2 sat saturations on 4 L nasal cannula oxygen.  At this time question the possibility of PE given patient had not been on anticoagulation since initial postop day.  Chest x-ray was ordered and did not show any acute abnormalities or cardiac monitor.  At this time question possibility of pulmonary embolus versus congestive heart failure exacerbation. -Continuous pulse oximetry with nasal cannula oxygen maintain O2 saturation greater than 92% -Bedrest for until evaluated for DVT/PE -Lovenox per pharmacy -Check VQ scan -Check Doppler ultrasound of the bilateral lower extremities  3.  Elevated D-dimer: Acute.  D-dimer elevated at 16.44.  Given the significant elevation there is always concern of a pulmonary embolus, unclear if this could be a result of recent surgery. -Follow-up pending studies  4.  Elevated BNP: BNP was elevated at 666.3.  Patient did have some crackles cruciated in the mid to lower lung fields for which the elevated BNP could suggest fluid.  Although she no does not appear to be grossly fluid overloaded.   -Check echocardiogram as this we will also help further evaluate if there is signs of a pulmonary embolus -Lasix 40 mg IV x1 dose  5.  Question acute blood loss anemia: Hemoglobin dropped from 12.2 on admission down to as low as 7.3 g/dL yesterday.  Patient was  transfused 1 unit of packed red blood cells with improvement to 9.1 today.  Question secondary to recent surgery versus delusional effect. -Check stool guaiac -Continue to monitor H&H  6.  Urinary retention: Acute.  Patient reported to be unable to urinate overnight for which she had for catheterization. -Place Foley catheter  6.  Hypertension: Blood pressures currently maintained. -Continue hydrochlorothiazide  7.  Hypothyroidism -Continue levothyroxine  I will followup again tomorrow. Please contact me if I can be of assistance in the meanwhile. Thank you for this consultation.  Chief Complaint: Back pain  HPI:  Karen Ramirez is a 70 y.o. female with medical history significant of hypothyroidism, hyperlipidemia, nephrolithiasis, PMR, and lumbar stenosis with herniated disks who presented with complaints of continued back pain.  She had previously undergone surgical decompression with posterior fusion of L1-4 with Dr. Ellene Route on 4/5.  Following the surgery patient was noted to be significantly debilitated and had been transferred to skilled nursing facility for rehab at discharge.  Patient reported continued pain in her lower back radiating down her legs.  During her postop visit patient was noted to have L4 fracture with lost fixation.  She was admitted back into the hospital on 5/14 and underwent revision surgery to compress L4 and perform fixation to the pelvis to stabilize the mid lumbar spine.  On postop day 4 patient's hemoglobin was noted to have dropped down to 7.3 g/dL and patient was transfused 1 unit of packed red blood cells.  Overnight patient appeared to repeat hemoglobin today was 9.1.  Patient had not been on DVT prophylaxis during this time.  Patient had been on 2  L nasal cannula oxygen, but had to be increased to 4 L of the 4 hours per patient received blood.  Patient denies any history of congestive heart failure previously in the past.   Review of Systems: Review of  Systems  Respiratory: Positive for shortness of breath.   Musculoskeletal: Positive for back pain.  Neurological: Positive for dizziness.    Past Medical History:  Diagnosis Date  . Arthritis   . Complication of anesthesia   . DDD (degenerative disc disease), lumbar   . Dermatitis   . Diverticulosis   . Dysrhythmia    pt states "occassionally"   . GERD (gastroesophageal reflux disease)   . Hemorrhoids   . History of kidney stones   . Hypercholesteremia   . Hyperglycemia   . Hypothyroidism   . Leg edema   . Osteoporosis   . PMR (polymyalgia rheumatica) (HCC)   . PONV (postoperative nausea and vomiting)   . Renal stone   . Vitamin D deficiency   . Wears glasses    Past Surgical History:  Procedure Laterality Date  . ABDOMINAL HYSTERECTOMY  1986  . APPENDECTOMY    . BACK SURGERY    . BILATERAL CARPAL TUNNEL RELEASE  2014  . BLEPHAROPLASTY  2016   both eyes  . COLONOSCOPY    . ESOPHAGOGASTRODUODENOSCOPY    . LUMBAR LAMINECTOMY  1989  . LUMBAR LAMINECTOMY WITH COFLEX 2 LEVEL N/A 04/12/2015   Procedure: Lumbar two- three,  Lumbar three- four Laminectomy with Coflex;  Surgeon: Kristeen Miss, MD;  Location: [place] [facility];  Service: Neurosurgery;  Laterality: N/A;  L2-3 L3-4 Laminectomy with Coflex  . POSTERIOR LUMBAR FUSION 4 LEVEL N/A 05/14/2020   Procedure: Revision of fixation from Lumbar Two to lium with decompression of Lumbar Four vertebral fracture;  Surgeon: Kristeen Miss, MD;  Location: [place];  Service: Neurosurgery;  Laterality: N/A;  . SHOULDER ARTHROSCOPY WITH BICEPSTENOTOMY Right 05/10/2014   Procedure: SHOULDER ARTHROSCOPY WITH BICEPSTENOTOMY;  Surgeon: Tania Ade, MD;  Location: [place];  Service: Orthopedics;  Laterality: Right;  . SHOULDER ARTHROSCOPY WITH DISTAL CLAVICLE RESECTION Right 05/10/2014   Procedure: SHOULDER ARTHROSCOPY WITH DISTAL CLAVICLE RESECTION DEBRIDEMENT OF ROTATOR CUFF TEAR;  Surgeon: Tania Ade, MD;  Location: [place];  Service: Orthopedics;  Laterality: Right;  . SHOULDER ARTHROSCOPY WITH SUBACROMIAL DECOMPRESSION Right 05/10/2014   Procedure: SHOULDER ARTHROSCOPY WITH SUBACROMIAL DECOMPRESSION;  Surgeon: Tania Ade, MD;  Location: [place];  Service: Orthopedics;  Laterality: Right;  . THUMB ARTHROSCOPY  3/16   left  . TONSILLECTOMY    . VEIN LIGATION     left leg   Social History:  reports that she has never smoked. She has never used smokeless tobacco. She reports current alcohol use. She reports that she does not use drugs.  Allergies  Allergen Reactions  . Azithromycin Diarrhea  . Levaquin [Levofloxacin] Nausea And Vomiting  . Ultram [Tramadol] Nausea And Vomiting   Family History  Problem Relation Age of Onset  . Breast cancer Maternal Aunt 70  . Breast cancer Maternal Aunt 70    Prior to Admission medications   Medication Sig Start Date End Date Taking? Authorizing Provider  carboxymethylcellulose (REFRESH PLUS) 0.5 % SOLN Place 1 drop into both eyes at bedtime.   Yes [provider]  celecoxib (CELEBREX) 200 MG capsule Take 200 mg by mouth in the morning and at bedtime. 05/10/20  Yes [provider]  Cholecalciferol (VITAMIN D3) 125 MCG (5000  UT) TABS Take 5,000 Units by mouth every other day.   Yes [provider]  dexamethasone (DECADRON) 1 MG tablet 2 tablets twice daily for 2 days, one tablet twice daily for 2 days, one tablet daily for 2 days. 05/19/20  Yes Kristeen Miss, MD  diazepam (VALIUM) 5 MG tablet Take 2.5 mg by mouth at bedtime.   Yes [provider]  famotidine (PEPCID) 20 MG tablet Take 20 mg by mouth 2 (two) times daily before a meal.   Yes [provider]  folic acid (FOLVITE) 1 MG tablet Take 2 mg by mouth daily. 03/24/20  Yes [provider]  hydrochlorothiazide (HYDRODIURIL) 12.5 MG tablet Take 12.5 mg by mouth daily. 01/12/20  Yes [provider]  levothyroxine  (SYNTHROID, LEVOTHROID) 75 MCG tablet Take 75 mcg by mouth daily before breakfast.   Yes [provider]  predniSONE (DELTASONE) 10 MG tablet Take 10 mg by mouth in the morning.   Yes [provider]  pregabalin (LYRICA) 75 MG capsule Take 75 mg by mouth 3 (three) times daily.   Yes [provider]  simvastatin (ZOCOR) 20 MG tablet Take 20 mg by mouth at bedtime.    Yes [provider]  triamcinolone (NASACORT) 55 MCG/ACT AERO nasal inhaler Place 1 spray into the nose at bedtime.   Yes [provider]  HYDROcodone-acetaminophen (NORCO/VICODIN) 5-325 MG tablet Take 1-2 tablets by mouth every 4 (four) hours as needed for moderate pain. 05/19/20   Kristeen Miss, MD  methocarbamol (ROBAXIN) 500 MG tablet Take 1 tablet (500 mg total) by mouth every 6 (six) hours as needed for muscle spasms. 04/08/20   Dawley, Troy C, DO  methocarbamol (ROBAXIN) 500 MG tablet Take 1 tablet (500 mg total) by mouth every 6 (six) hours as needed for muscle spasms. 05/19/20   Kristeen Miss, MD   Physical Exam:  Constitutional: Elderly female who appears to be in some discomfort laying in the hospital bed Vitals:   05/19/20 0023 05/19/20 0416 05/19/20 0417 05/19/20 0753  BP: 114/74 95/73 103/76 117/78  Pulse: 95 97 90 97  Resp: 18 19    Temp: 97.7 F (36.5 C) 97.9 F (36.6 C)  98 F (36.7 C)  TempSrc: Oral Oral  Oral  SpO2: 92% 92% 93% (!) 89%  Weight:      Height:       Eyes: PERRL, lids and conjunctivae normal ENMT: Mucous membranes are moist. Posterior pharynx clear of any exudate or lesions. Neck: normal, supple, no masses, no thyromegaly.  No JVD appreciated Respiratory: Normal respiratory effort with crackles appreciated in the mid to lower lung fields bilaterally.  Patient on 4 L nasal cannula oxygen able to talk in almost complete sentences Cardiovascular: Regular rate and rhythm, no murmurs / rubs / gallops.  Trace lower extremity edema. 2+ pedal pulses. No carotid  bruits.  Abdomen: no tenderness, no masses palpated. No hepatosplenomegaly. Bowel sounds positive.  Musculoskeletal: no clubbing / cyanosis. No joint deformity upper and lower extremities. Good ROM, no contractures. Normal muscle tone.  Skin: no rashes, lesions, ulcers. No induration Neurologic: CN 2-12 grossly intact. Sensation intact, DTR normal. Strength 5/5 in all 4.  Psychiatric: Normal judgment and insight. Alert and oriented x 3. Normal mood.   Labs on Admission:  Basic Metabolic Panel: Recent Labs  Lab 05/12/20 1600 05/15/20 0452  NA 139 137  K 4.2 4.2  CL 104 102  CO2 27 25  GLUCOSE 139* 157*  BUN 19 20  CREATININE 0.82 0.97  CALCIUM 9.5 8.3*   Liver Function Tests: No results for input(s): AST, ALT, ALKPHOS, BILITOT, PROT, ALBUMIN in the last 168 hours. No results for input(s): LIPASE, AMYLASE in the last 168 hours. No results for input(s): AMMONIA in the last 168 hours. CBC: Recent Labs  Lab 05/14/20 0616 05/15/20 0452 05/18/20 0344 05/18/20 2230  WBC 11.0* 13.7* 9.3 9.2  HGB 12.2 7.8* 7.3* 9.1*  HCT 40.1 25.8* 23.6* 29.1*  MCV 94.8 95.6 93.7 91.8  PLT 268 168 206 197   Cardiac Enzymes: No results for input(s): CKTOTAL, CKMB, CKMBINDEX, TROPONINI in the last 168 hours. BNP: Invalid input(s): POCBNP CBG: No results for input(s): GLUCAP in the last 168 hours.  Radiological Exams on Admission: DG CHEST PORT 1 VIEW  Result Date: 05/19/2020 CLINICAL DATA:  Shortness of breath EXAM: PORTABLE CHEST 1 VIEW COMPARISON:  Chest CT October 09, 2018 FINDINGS: Lungs are clear. Heart size and pulmonary vascularity are normal. No adenopathy. No bone lesions. IMPRESSION: Lungs clear.  Cardiac silhouette normal. Electronically Signed   By: Lowella Grip III M.D.   On: 05/19/2020 09:24    EKG: Independently reviewed.  Sinus rhythm with premature atrial complexes appreciated at 95 bpm  Time spent: >45 minutes  [facility]   If 7PM-7AM,  please contact night-coverage

## 2020-05-19 NOTE — TOC Progression Note (Signed)
Transition of Care [facility]) - Progression Note    Patient Details  Name: Karen Ramirez MRN: [id number] Date of Birth: 03/24/1950  Transition of Care [facility]) CM/SW Contact  Pollie Friar, RN Phone Number: 05/19/2020, 10:11 AM  Clinical Narrative:    Patient with some increased SOB. Awaiting results of ordered tests. CM has left voicemail for Seth Bake at [facility] about hold on d/c for now.  TOC following.   Expected Discharge Plan: [place] Barriers to Discharge: Continued Medical Work up  Expected Discharge Plan and Services Expected Discharge Plan: [place] In-house Referral: Clinical Social Work Discharge Planning Services: CM Consult Post Acute Care Choice: [place] arrangements for the past 2 months: Single Family Home Expected Discharge Date: 05/19/20                                     Social Determinants of Health (SDOH) Interventions    Readmission Risk Interventions No flowsheet data found.

## 2020-05-19 NOTE — Progress Notes (Signed)
Bilateral lower extremity venous duplex has been completed. Preliminary results can be found in CV Proc through chart review.  Results were given to the patient's nurse, Lennette Bihari.  05/19/20 4:17 PM Karen Ramirez RVT

## 2020-05-20 ENCOUNTER — Inpatient Hospital Stay (HOSPITAL_COMMUNITY): Payer: Medicare Other

## 2020-05-20 ENCOUNTER — Other Ambulatory Visit (HOSPITAL_COMMUNITY): Payer: Self-pay

## 2020-05-20 DIAGNOSIS — I5031 Acute diastolic (congestive) heart failure: Secondary | ICD-10-CM

## 2020-05-20 DIAGNOSIS — I82491 Acute embolism and thrombosis of other specified deep vein of right lower extremity: Secondary | ICD-10-CM

## 2020-05-20 DIAGNOSIS — M48062 Spinal stenosis, lumbar region with neurogenic claudication: Secondary | ICD-10-CM

## 2020-05-20 DIAGNOSIS — I2699 Other pulmonary embolism without acute cor pulmonale: Secondary | ICD-10-CM

## 2020-05-20 DIAGNOSIS — M4846XS Fatigue fracture of vertebra, lumbar region, sequela of fracture: Secondary | ICD-10-CM

## 2020-05-20 LAB — ECHOCARDIOGRAM COMPLETE
AR max vel: 2.3 cm2
AV Area VTI: 2.7 cm2
AV Area mean vel: 2.76 cm2
AV Mean grad: 2 mmHg
AV Peak grad: 4.3 mmHg
Ao pk vel: 1.04 m/s
Area-P 1/2: 2.46 cm2
Height: 62.52 in
MV VTI: 1.69 cm2
S' Lateral: 2.1 cm
Weight: 2913.6 oz

## 2020-05-20 MED ORDER — IOHEXOL 350 MG/ML SOLN
50.0000 mL | Freq: Once | INTRAVENOUS | Status: AC | PRN
  Administered 2020-05-20: 50 mL via INTRAVENOUS

## 2020-05-20 MED ORDER — HEPARIN (PORCINE) 25000 UT/250ML-% IV SOLN
1200.0000 [IU]/h | INTRAVENOUS | Status: DC
  Administered 2020-05-20: 1200 [IU]/h via INTRAVENOUS
  Filled 2020-05-20: qty 250

## 2020-05-20 NOTE — TOC Progression Note (Signed)
Transition of Care [facility]) - Progression Note    Patient Details  Name: Karen Ramirez MRN: [id number] Date of Birth: 1950/01/06  Transition of Care [facility]) CM/SW Contact  Pollie Friar, RN Phone Number: 05/20/2020, 10:43 AM  Clinical Narrative:    MD says not medically ready for d/c until most likely Monday. Cm has left voicemail for Seth Bake at [facility]. Pt will need a new covid test prior to admission.  TOC following.   Expected Discharge Plan: [place] Barriers to Discharge: Continued Medical Work up  Expected Discharge Plan and Services Expected Discharge Plan: [place] In-house Referral: Clinical Social Work Discharge Planning Services: CM Consult Post Acute Care Choice: [place] arrangements for the past 2 months: Single Family Home Expected Discharge Date: 05/19/20                                     Social Determinants of Health (SDOH) Interventions    Readmission Risk Interventions No flowsheet data found.

## 2020-05-20 NOTE — Consult Note (Signed)
Chief Complaint: Patient was seen in consultation today for pulmonary embolus  Referring Physician(s): Dr. Tildon Husky  Supervising Physician: Malachy Moan  Patient Status: [facility] - In-pt  History of Present Illness: Karen Ramirez is a 70 y.o. female with past medical history of DDD, GERD, polymyalgia rheumatica who was admited 05/14/20 for surgical decompression of L4 fracture that resulted in severe stenosis.  She is now POD 5 s/p decompression with laminectomy and fixation.  She did have symptomatic anemia post-procedure with ongoing weakness which prolonged her hospital stay. She was planning to d/c to SNF yesterday when she was noted to be hypoxic.  Patient underwent VQ scan which was positive filling defects consistent with PE.  LE Doppler studies also showed evidence of acute DVT.  She was started on lovenox and her discharged canceled.   IR consulted for possible thrombolysis procedure by Dr. Tyson Babinski.   Case reviewed by Dr. Miles Costain who recommended CTA prior to consideration for IR intervention.  Her renal function is WNL.   CTA showed: Acute bilateral pulmonary emboli, symmetrically extending from the distal main pulmonary arteries to the subsegmental branches in the upper and lower lobes. There is CT evidence of right heart strain.  Her troponins were 61.   ECHO performed also showed severe right heart strain.   Dr. Miles Costain has discussed with Dr. Archer Asa who recommends proceeding with [facility] thrombectomy given surgical history to reduce clot burden and alleviate strain.   Patient assessed at bedside this evening alongside her husband.  She is on 4L [place].  Denies significant shortness of breath but admits that she is not currently ambulatory.  This is her 3rd back-related procedure this year and she has largely been immobilized due to pain.  She has some cramping in her legs.  She is currently tolerating lovenox.  They are agreeable to intervention.   Past Medical  History:  Diagnosis Date  . Arthritis   . Complication of anesthesia   . DDD (degenerative disc disease), lumbar   . Dermatitis   . Diverticulosis   . Dysrhythmia    pt states "occassionally"   . GERD (gastroesophageal reflux disease)   . Hemorrhoids   . History of kidney stones   . Hypercholesteremia   . Hyperglycemia   . Hypothyroidism   . Leg edema   . Osteoporosis   . PMR (polymyalgia rheumatica) (HCC)   . PONV (postoperative nausea and vomiting)   . Renal stone   . Vitamin D deficiency   . Wears glasses     Past Surgical History:  Procedure Laterality Date  . ABDOMINAL HYSTERECTOMY  1986  . APPENDECTOMY    . BACK SURGERY    . BILATERAL CARPAL TUNNEL RELEASE  2014  . BLEPHAROPLASTY  2016   both eyes  . COLONOSCOPY    . ESOPHAGOGASTRODUODENOSCOPY    . LUMBAR LAMINECTOMY  1989  . LUMBAR LAMINECTOMY WITH COFLEX 2 LEVEL N/A 04/12/2015   Procedure: Lumbar two- three,  Lumbar three- four Laminectomy with Coflex;  Surgeon: Barnett Abu, MD;  Location: [facility];  Service: Neurosurgery;  Laterality: N/A;  L2-3 L3-4 Laminectomy with Coflex  . POSTERIOR LUMBAR FUSION 4 LEVEL N/A 05/14/2020   Procedure: Revision of fixation from Lumbar Two to lium with decompression of Lumbar Four vertebral fracture;  Surgeon: Barnett Abu, MD;  Location: [facility];  Service: Neurosurgery;  Laterality: N/A;  . SHOULDER ARTHROSCOPY WITH BICEPSTENOTOMY Right 05/10/2014   Procedure: SHOULDER ARTHROSCOPY WITH BICEPSTENOTOMY;  Surgeon: Jones Broom, MD;  Location: [place];  Service: Orthopedics;  Laterality: Right;  . SHOULDER ARTHROSCOPY WITH DISTAL CLAVICLE RESECTION Right 05/10/2014   Procedure: SHOULDER ARTHROSCOPY WITH DISTAL CLAVICLE RESECTION DEBRIDEMENT OF ROTATOR CUFF TEAR;  Surgeon: Tania Ade, MD;  Location: [place];  Service: Orthopedics;  Laterality: Right;  . SHOULDER ARTHROSCOPY WITH SUBACROMIAL DECOMPRESSION Right 05/10/2014   Procedure: SHOULDER  ARTHROSCOPY WITH SUBACROMIAL DECOMPRESSION;  Surgeon: Tania Ade, MD;  Location: [place];  Service: Orthopedics;  Laterality: Right;  . THUMB ARTHROSCOPY  3/16   left  . TONSILLECTOMY    . VEIN LIGATION     left leg    Allergies: Azithromycin, Levaquin [levofloxacin], and Ultram [tramadol]  Medications: Prior to Admission medications   Medication Sig Start Date End Date Taking? Authorizing Provider  carboxymethylcellulose (REFRESH PLUS) 0.5 % SOLN Place 1 drop into both eyes at bedtime.   Yes [provider]  celecoxib (CELEBREX) 200 MG capsule Take 200 mg by mouth in the morning and at bedtime. 05/10/20  Yes [provider]  Cholecalciferol (VITAMIN D3) 125 MCG (5000 UT) TABS Take 5,000 Units by mouth every other day.   Yes [provider]  dexamethasone (DECADRON) 1 MG tablet 2 tablets twice daily for 2 days, one tablet twice daily for 2 days, one tablet daily for 2 days. 05/19/20  Yes Kristeen Miss, MD  famotidine (PEPCID) 20 MG tablet Take 20 mg by mouth 2 (two) times daily before a meal.   Yes [provider]  folic acid (FOLVITE) 1 MG tablet Take 2 mg by mouth daily. 03/24/20  Yes [provider]  hydrochlorothiazide (HYDRODIURIL) 12.5 MG tablet Take 12.5 mg by mouth daily. 01/12/20  Yes [provider]  levothyroxine (SYNTHROID, LEVOTHROID) 75 MCG tablet Take 75 mcg by mouth daily before breakfast.   Yes [provider]  predniSONE (DELTASONE) 10 MG tablet Take 10 mg by mouth in the morning.   Yes [provider]  simvastatin (ZOCOR) 20 MG tablet Take 20 mg by mouth at bedtime.    Yes [provider]  triamcinolone (NASACORT) 55 MCG/ACT AERO nasal inhaler Place 1 spray into the nose at bedtime.   Yes [provider]  diazepam (VALIUM) 5 MG tablet Take 0.5 tablets (2.5 mg total) by mouth at bedtime. 05/19/20   Kristeen Miss, MD  HYDROcodone-acetaminophen (NORCO/VICODIN) 5-325 MG  tablet Take 1-2 tablets by mouth every 4 (four) hours as needed for moderate pain. 05/19/20   Kristeen Miss, MD  methocarbamol (ROBAXIN) 500 MG tablet Take 1 tablet (500 mg total) by mouth every 6 (six) hours as needed for muscle spasms. 04/08/20   Dawley, Troy C, DO  methocarbamol (ROBAXIN) 500 MG tablet Take 1 tablet (500 mg total) by mouth every 6 (six) hours as needed for muscle spasms. 05/19/20   Kristeen Miss, MD  pregabalin (LYRICA) 75 MG capsule Take 1 capsule (75 mg total) by mouth 3 (three) times daily. 05/19/20   Kristeen Miss, MD     Family History  Problem Relation Age of Onset  . Breast cancer Maternal Aunt 70  . Breast cancer Maternal Aunt 70    Social History   Socioeconomic History  . Marital status: Married    Spouse name: Not on file  . Number of children: Not on file  . Years of education: Not on file  . Highest education level: Not on file  Occupational History  . Not on file  Tobacco Use  . Smoking status:  Never Smoker  . Smokeless tobacco: Never Used  Substance and Sexual Activity  . Alcohol use: Yes    Comment: rare  . Drug use: No  . Sexual activity: Not on file  Other Topics Concern  . Not on file  Social History Narrative  . Not on file   Social Determinants of Health   Financial Resource Strain: Not on file  Food Insecurity: Not on file  Transportation Needs: Not on file  Physical Activity: Not on file  Stress: Not on file  Social Connections: Not on file     Review of Systems: A 12 point ROS discussed and pertinent positives are indicated in the HPI above.  All other systems are negative.  Review of Systems  Constitutional: Negative for fatigue and fever.  Respiratory: Positive for shortness of breath. Negative for cough, wheezing and stridor.   Cardiovascular: Negative for chest pain.  Gastrointestinal: Negative for abdominal pain, nausea and vomiting.  Musculoskeletal: Positive for arthralgias, back pain and gait problem.   Psychiatric/Behavioral: Negative for behavioral problems and confusion.    Vital Signs: BP 100/74 (BP Location: Left Arm)   Pulse 86   Temp 98.3 F (36.8 C) (Oral)   Resp 16   Ht 5' 2.52" (1.588 m)   Wt 182 lb 1.6 oz (82.6 kg)   SpO2 95%   BMI 32.75 kg/m   Physical Exam Vitals and nursing note reviewed.  Constitutional:      General: She is not in acute distress.    Appearance: Normal appearance. She is not ill-appearing.  HENT:     Mouth/Throat:     Mouth: Mucous membranes are moist.     Pharynx: Oropharynx is clear.  Cardiovascular:     Rate and Rhythm: Normal rate and regular rhythm.  Pulmonary:     Effort: Pulmonary effort is normal. No respiratory distress.     Breath sounds: Normal breath sounds.  Skin:    General: Skin is warm and dry.  Neurological:     General: No focal deficit present.     Mental Status: She is alert and oriented to person, place, and time. Mental status is at baseline.  Psychiatric:        Mood and Affect: Mood normal.        Behavior: Behavior normal.        Thought Content: Thought content normal.        Judgment: Judgment normal.      MD Evaluation Airway: WNL Heart: WNL Abdomen: WNL Chest/ Lungs: WNL ASA  Classification: 3 Mallampati/Airway Score: Two   Imaging: DG Lumbar Spine 2-3 Views  Result Date: 05/16/2020 CLINICAL DATA:  Recent lumbar fusion EXAM: LUMBAR SPINE - 2-3 VIEW COMPARISON:  05/14/2020 FINDINGS: Pedicle screws are noted at L1, L2, L3, L5, S1 and extending into the iliac bones bilaterally. Posterior fusion is noted. The overall appearance is similar to that seen on the prior exam. Interbody fusion is noted at L1-2, L2-3 and, L3-4. Stable compression deformity at L4 is noted. Mild anterolisthesis of L2 on L3 and L3 on L4 is noted. No acute abnormality noted. IMPRESSION: Postsurgical changes.  No acute abnormality seen. Electronically Signed   By: Inez Catalina M.D.   On: 05/16/2020 22:16   DG Lumbar Spine 2-3  Views  Result Date: 05/14/2020 CLINICAL DATA:  Elective surgery.  Revision of fixation. EXAM: DG C-ARM 1-60 MIN; LUMBAR SPINE - 2-3 VIEW FLUOROSCOPY TIME:  Fluoroscopy Time:  46 seconds Radiation Exposure Index (if provided by  the fluoroscopic device): 35.85 mGy Number of Acquired Spot Images: 9 COMPARISON:  CT lumbar spine 05/09/2020 FINDINGS: Nine fluoroscopic spot views obtained of the lumbar spine in the operating room. Previous L1 through L4 posterior rod and intrapedicular screw fusion hardware now extends to the sacrum. Interbody spacers in place. IMPRESSION: Procedural fluoroscopy during revision of lumbar fixation. Electronically Signed   By: Keith Rake M.D.   On: 05/14/2020 14:03   CT ANGIO CHEST PE W OR WO CONTRAST  Result Date: 05/20/2020 CLINICAL DATA:  70 year old female with recent positive V/Q scan for pulmonary embolism. EXAM: CT ANGIOGRAPHY CHEST WITH CONTRAST TECHNIQUE: Multidetector CT imaging of the chest was performed using the standard protocol during bolus administration of intravenous contrast. Multiplanar CT image reconstructions and MIPs were obtained to evaluate the vascular anatomy. CONTRAST:  Fifty mL Omnipaque 350, intravenous COMPARISON:  05/19/2020, 10/09/2018 FINDINGS: Cardiovascular: Satisfactory opacification of the pulmonary arteries to the segmental level. Acute bilateral pulmonary emboli extending from the distal main bilateral pulmonary arteries to the upper and lower subsegmental branches, mostly nonocclusive throughout. The right to left ventricular ratio is approximately 1.4. There is mild prominence of the main pulmonary artery measuring up to 31 mm. There is reflux of contrast into the perihepatic IVC and central hepatic veins. No pericardial effusion. Mediastinum/Nodes: No enlarged mediastinal, hilar, or axillary lymph nodes. Thyroid gland, trachea, and esophagus demonstrate no significant findings. Lungs/Pleura: Diffuse mosaic attenuation pattern. Scattered  interlobular septal thickening. Mild bibasilar subsegmental atelectasis. No evidence of pulmonary infarction. No significant pleural effusion or evidence of pneumothorax. No suspicious pulmonary nodules. Upper Abdomen: Diffusely decreased attenuation of the hepatic parenchyma. The remaining visualized upper abdomen is otherwise within normal limits. Musculoskeletal: Mild multilevel degenerative changes of the thoracic spine. No acute osseous abnormality or aggressive appearing osseous lesion. Review of the MIP images confirms the above findings. IMPRESSION: Vascular: Acute bilateral pulmonary emboli, symmetrically extending from the distal main pulmonary arteries to the subsegmental branches in the upper and lower lobes. There is CT evidence of right heart strain. Non-Vascular: 1. Mild pulmonary edema.  No evidence of pulmonary infarction. 2. Hepatic steatosis. These results were relayed via Epic secure chat at the time of interpretation on 05/20/2020 at 4:22 pm to provider [facility], who acknowledged these results. Ruthann Cancer, MD [facility] Electronically Signed   By: Ruthann Cancer MD   On: 05/20/2020 16:25   CT LUMBAR SPINE WO CONTRAST  Result Date: 05/09/2020 CLINICAL DATA:  History of recent lumbar fusion surgery 04/05/2020. New L4 fracture on outside lumbar radiographs. EXAM: CT LUMBAR SPINE WITHOUT CONTRAST TECHNIQUE: Multidetector CT imaging of the lumbar spine was performed without intravenous contrast administration. Multiplanar CT image reconstructions were also generated. COMPARISON:  Outside lumbar radiographs 05/06/2020. CT myelogram 02/26/2020 FINDINGS: Segmentation: There are five lumbar type vertebral bodies. The last full intervertebral disc space is labeled L5-S1. This correlates with the lumbar radiographs and prior CT myelogram from 02/26/2020 Alignment: The overall alignment is maintained. Vertebrae: New compression fracture of  L4 estimated at 50%. The left-sided screw appears loose. The interbody fusion device has herniated back into the spinal canal and appears to be contributing to bilateral foraminal stenosis. The other lumbar vertebral bodies demonstrate osteoporosis but no other fractures. Paraspinal and other soft tissues: No significant paraspinal or retroperitoneal findings. Disc levels: T12-L1: No significant findings. L1-2: Wide decompressive laminectomy with posterior and interbody fusion changes. No complicating features at this level. L2-3: Wide decompressive laminectomy with posterior and interbody  fusion changes. No complicating features. L3-4: Wide decompressive laminectomy. Acute L4 fracture. I do not see any involvement of the pedicles. There is mild retropulsion of the posterior aspect of the vertebral body and there is also subsidence of the interbody fusion device into the L4 vertebral body and also posterior herniation into the spinal canal. There is also significant foraminal stenosis bilaterally by the interbody fusion material, right greater than left. L4-5: Degenerative disc disease with disc space narrowing. Remote right-sided laminectomy defect is noted. Extensive calcifications along the ligamentum flavum bilaterally with mild spinal stenosis. No significant foraminal stenosis. L5-S1: Advanced degenerative disease with marked disc space narrowing and osteophytic ridging. Calcifications/ossification of the ligamentum flavum is noted with mild spinal stenosis. No obvious foraminal stenosis. IMPRESSION: 1. Acute L4 fracture estimated at 50%. There is mild retropulsion of the posterior aspect of the vertebral body and there is also subsidence of the interbody fusion device into the L4 vertebral body and also posterior herniation into the spinal canal. There is also significant bilateral foraminal stenosis caused by the by the interbody fusion material, right greater than left.Advanced degenerative disc disease at  L4-5 and L5-S1 with mild spinal stenosis. 2. Chronic degenerative disc disease at L4-5 and L5-S1. Electronically Signed   By: Marijo Sanes M.D.   On: 05/09/2020 14:21   NM Pulmonary Perfusion  Result Date: 05/19/2020 CLINICAL DATA:  Shortness of breath and positive D-dimer EXAM: NUCLEAR MEDICINE PERFUSION LUNG SCAN TECHNIQUE: Perfusion images were obtained in multiple projections after intravenous injection of radiopharmaceutical. Ventilation scans intentionally deferred if perfusion scan and chest x-ray adequate for interpretation during COVID 19 epidemic. RADIOPHARMACEUTICALS:  Four mCi Tc-77m MAA IV COMPARISON:  Chest from earlier in the same day. FINDINGS: Multiple wedge like defects are identified bilaterally as well as near absence of perfusion in the right lung base. These changes would be consistent with multifocal pulmonary emboli. IMPRESSION: Multiple defects bilaterally with a normal appearing chest x-ray consistent with pulmonary embolism. Electronically Signed   By: Inez Catalina M.D.   On: 05/19/2020 15:52   DG CHEST PORT 1 VIEW  Result Date: 05/19/2020 CLINICAL DATA:  Shortness of breath EXAM: PORTABLE CHEST 1 VIEW COMPARISON:  Chest CT October 09, 2018 FINDINGS: Lungs are clear. Heart size and pulmonary vascularity are normal. No adenopathy. No bone lesions. IMPRESSION: Lungs clear.  Cardiac silhouette normal. Electronically Signed   By: Lowella Grip III M.D.   On: 05/19/2020 09:24   DG C-Arm 1-60 Min  Result Date: 05/14/2020 CLINICAL DATA:  Elective surgery.  Revision of fixation. EXAM: DG C-ARM 1-60 MIN; LUMBAR SPINE - 2-3 VIEW FLUOROSCOPY TIME:  Fluoroscopy Time:  46 seconds Radiation Exposure Index (if provided by the fluoroscopic device): 35.85 mGy Number of Acquired Spot Images: 9 COMPARISON:  CT lumbar spine 05/09/2020 FINDINGS: Nine fluoroscopic spot views obtained of the lumbar spine in the operating room. Previous L1 through L4 posterior rod and intrapedicular screw fusion  hardware now extends to the sacrum. Interbody spacers in place. IMPRESSION: Procedural fluoroscopy during revision of lumbar fixation. Electronically Signed   By: Keith Rake M.D.   On: 05/14/2020 14:03   ECHOCARDIOGRAM COMPLETE  Result Date: 05/20/2020    ECHOCARDIOGRAM REPORT   Patient Name:   Karen Ramirez Date of Exam: 05/20/2020 Medical Rec #:  [id number]          Height:       62.5 in Accession #:    [id number]         Weight:  182.1 lb Date of Birth:  [birth year]          BSA:          1.848 m Patient Age:    12 years           BP:           107/65 mmHg Patient Gender: F                  HR:           96 bpm. Exam Location:  Inpatient Procedure: 2D Echo, Cardiac Doppler and Color Doppler Indications:     CHF-Acute Diastolic  History:         Patient has no prior history of Echocardiogram examinations.                  Acute pulmonary embolus. Hx heart arrythmia. GERD.  Sonographer:     Clayton Lefort RDCS (AE) Referring Phys:  [id number] [facility] Diagnosing Phys: Gwyndolyn Kaufman MD IMPRESSIONS  1. Right ventricular systolic function is severely reduced. The right ventricular size is severely enlarged. There is severely elevated pulmonary artery systolic pressure. The estimated right ventricular systolic pressure is 46.9 mmHg. Findings consistent with acute pulmonary with significant RV strain and severe pulmonary hypertension.  2. Left ventricular ejection fraction, by estimation, is 60 to 65%. The left ventricle has normal function. The left ventricle has no regional wall motion abnormalities. Left ventricular diastolic parameters are consistent with Grade I diastolic dysfunction (impaired relaxation). There is the interventricular septum is flattened in systole, consistent with right ventricular pressure overload.  3. The mitral valve is grossly normal. Trivial mitral valve regurgitation.  4. Tricuspid valve regurgitation is moderate.  5. The aortic valve is tricuspid. There is mild  calcification of the aortic valve. There is moderate thickening of the aortic valve. Aortic valve regurgitation is not visualized. Mild to moderate aortic valve sclerosis/calcification is present, without any evidence of aortic stenosis.  6. The inferior vena cava is dilated in size with <50% respiratory variability, suggesting right atrial pressure of 15 mmHg. Comparison(s): No prior Echocardiogram. Conclusion(s)/Recommendation(s): Patient with severe RV dysfunction and pulmonary hypertension with acute PE. Consider IR evaluation if clinically indicated as may benefit from catheter directed thrombolysis if cleared from a surgical standpoint. FINDINGS  Left Ventricle: Left ventricular ejection fraction, by estimation, is 60 to 65%. The left ventricle has normal function. The left ventricle has no regional wall motion abnormalities. The left ventricular internal cavity size was normal in size. There is  no left ventricular hypertrophy. The interventricular septum is flattened in systole, consistent with right ventricular pressure overload. Left ventricular diastolic parameters are consistent with Grade I diastolic dysfunction (impaired relaxation). Right Ventricle: The right ventricular size is severely enlarged. No increase in right ventricular wall thickness. Right ventricular systolic function is severely reduced. There is severely elevated pulmonary artery systolic pressure. The tricuspid regurgitant velocity is 4.28 m/s, and with an assumed right atrial pressure of 15 mmHg, the estimated right ventricular systolic pressure is 62.9 mmHg. Left Atrium: Left atrial size was normal in size. Right Atrium: Right atrial size was normal in size. Pericardium: There is no evidence of pericardial effusion. Mitral Valve: The mitral valve is grossly normal. Trivial mitral valve regurgitation. MV peak gradient, 3.1 mmHg. The mean mitral valve gradient is 1.0 mmHg. Tricuspid Valve: The tricuspid valve is normal in structure.  Tricuspid valve regurgitation is moderate. Aortic Valve: The aortic valve is tricuspid. There is mild  calcification of the aortic valve. There is moderate thickening of the aortic valve. Aortic valve regurgitation is not visualized. Mild to moderate aortic valve sclerosis/calcification is present, without any evidence of aortic stenosis. Aortic valve mean gradient measures 2.0 mmHg. Aortic valve peak gradient measures 4.3 mmHg. Aortic valve area, by VTI measures 2.70 cm. Pulmonic Valve: The pulmonic valve was normal in structure. Pulmonic valve regurgitation is trivial. Aorta: The aortic root and ascending aorta are structurally normal, with no evidence of dilitation. Venous: The inferior vena cava is dilated in size with less than 50% respiratory variability, suggesting right atrial pressure of 15 mmHg. IAS/Shunts: No atrial level shunt detected by color flow Doppler.  LEFT VENTRICLE PLAX 2D LVIDd:         3.50 cm  Diastology LVIDs:         2.10 cm  LV e' medial:    5.11 cm/s LV PW:         1.00 cm  LV E/e' medial:  9.8 LV IVS:        1.00 cm  LV e' lateral:   7.40 cm/s LVOT diam:     1.90 cm  LV E/e' lateral: 6.8 LV SV:         33 LV SV Index:   18 LVOT Area:     2.84 cm  RIGHT VENTRICLE            IVC RV Basal diam:  3.40 cm    IVC diam: 2.40 cm RV S prime:     9.14 cm/s TAPSE (M-mode): 1.4 cm LEFT ATRIUM           Index       RIGHT ATRIUM           Index LA diam:      2.20 cm 1.19 cm/m  RA Area:     15.10 cm LA Vol (A4C): 29.2 ml 15.80 ml/m RA Volume:   35.50 ml  19.21 ml/m  AORTIC VALVE AV Area (Vmax):    2.30 cm AV Area (Vmean):   2.76 cm AV Area (VTI):     2.70 cm AV Vmax:           104.00 cm/s AV Vmean:          56.800 cm/s AV VTI:            0.123 m AV Peak Grad:      4.3 mmHg AV Mean Grad:      2.0 mmHg LVOT Vmax:         84.20 cm/s LVOT Vmean:        55.200 cm/s LVOT VTI:          0.117 m LVOT/AV VTI ratio: 0.95  AORTA Ao Root diam: 3.60 cm Ao Asc diam:  2.90 cm MITRAL VALVE                TRICUSPID VALVE MV Area (PHT): 2.46 cm    TR Peak grad:   73.3 mmHg MV Area VTI:   1.69 cm    TR Vmax:        428.00 cm/s MV Peak grad:  3.1 mmHg MV Mean grad:  1.0 mmHg    SHUNTS MV Vmax:       0.88 m/s    Systemic VTI:  0.12 m MV Vmean:      52.5 cm/s   Systemic Diam: 1.90 cm MV Decel Time: 309 msec MV E velocity: 50.10 cm/s MV A velocity: 75.80 cm/s MV  E/A ratio:  0.66 Gwyndolyn Kaufman MD Electronically signed by Gwyndolyn Kaufman MD Signature Date/Time: 05/20/2020/1:22:39 PM    Final (Updated)    VAS [place] LOWER EXTREMITY VENOUS (DVT)  Result Date: 05/19/2020  Lower Venous DVT Study Patient Name:  Karen Ramirez  Date of Exam:   05/19/2020 Medical Rec #: [id number]           Accession #:    [id number] Date of Birth: 02/08/1950           Patient Gender: F Patient Age:   70Y Exam Location:  [facility] Procedure:      VAS [place] LOWER EXTREMITY VENOUS (DVT) Referring Phys: [id number] [place] --------------------------------------------------------------------------------  Indications: Swelling.  Risk Factors: None identified. Limitations: Poor ultrasound/tissue interface and body habitus. Comparison Study: No prior studies. Performing Technologist: Oliver Hum RVT  Examination Guidelines: A complete evaluation includes B-mode imaging, spectral Doppler, color Doppler, and power Doppler as needed of all accessible portions of each vessel. Bilateral testing is considered an integral part of a complete examination. Limited examinations for reoccurring indications may be performed as noted. The reflux portion of the exam is performed with the patient in reverse Trendelenburg.  +---------+---------------+---------+-----------+----------+--------------+ RIGHT    CompressibilityPhasicitySpontaneityPropertiesThrombus Aging +---------+---------------+---------+-----------+----------+--------------+ CFV      Full           Yes      Yes                                  +---------+---------------+---------+-----------+----------+--------------+ SFJ      Full                                                        +---------+---------------+---------+-----------+----------+--------------+ FV Prox  Full                                                        +---------+---------------+---------+-----------+----------+--------------+ FV Mid   Full                                                        +---------+---------------+---------+-----------+----------+--------------+ FV DistalFull                                                        +---------+---------------+---------+-----------+----------+--------------+ PFV      Full                                                        +---------+---------------+---------+-----------+----------+--------------+ POP      Partial        Yes      Yes  Acute          +---------+---------------+---------+-----------+----------+--------------+ PTV      Full                                                        +---------+---------------+---------+-----------+----------+--------------+ PERO     None                                         Acute          +---------+---------------+---------+-----------+----------+--------------+ Gastroc  Full                                                        +---------+---------------+---------+-----------+----------+--------------+   +---------+---------------+---------+-----------+----------+--------------+ LEFT     CompressibilityPhasicitySpontaneityPropertiesThrombus Aging +---------+---------------+---------+-----------+----------+--------------+ CFV      Full           Yes      Yes                                 +---------+---------------+---------+-----------+----------+--------------+ SFJ      Full                                                         +---------+---------------+---------+-----------+----------+--------------+ FV Prox  Full                                                        +---------+---------------+---------+-----------+----------+--------------+ FV Mid   Full                                                        +---------+---------------+---------+-----------+----------+--------------+ FV Distal               Yes      Yes                                 +---------+---------------+---------+-----------+----------+--------------+ PFV      Full                                                        +---------+---------------+---------+-----------+----------+--------------+ POP      Partial        Yes      Yes                                 +---------+---------------+---------+-----------+----------+--------------+  PTV      None                                         Acute          +---------+---------------+---------+-----------+----------+--------------+ PERO     None                                         Acute          +---------+---------------+---------+-----------+----------+--------------+ Gastroc  Partial                                      Acute          +---------+---------------+---------+-----------+----------+--------------+    Summary: RIGHT: - Findings consistent with acute deep vein thrombosis involving the right popliteal vein, and right peroneal veins. - No cystic structure found in the popliteal fossa.  LEFT: - Findings consistent with acute deep vein thrombosis involving the left popliteal vein, left posterior tibial veins, left peroneal veins, and left gastrocnemius veins. - No cystic structure found in the popliteal fossa.  *See table(s) above for measurements and observations.    Preliminary     Labs:  CBC: Recent Labs    05/14/20 0616 05/15/20 0452 05/18/20 0344 05/18/20 2230 05/19/20 1509 05/19/20 2052  WBC 11.0* 13.7* 9.3 9.2  --   --   HGB  12.2 7.8* 7.3* 9.1* 10.6* 9.7*  HCT 40.1 25.8* 23.6* 29.1* 33.8* 30.8*  PLT 268 168 206 197  --   --     COAGS: No results for input(s): INR, APTT in the last 8760 hours.  BMP: Recent Labs    04/05/20 1702 04/06/20 0620 05/12/20 1600 05/15/20 0452  NA 136 136 139 137  K 3.9 3.7 4.2 4.2  CL 105 101 104 102  CO2 23 25 27 25   GLUCOSE 197* 161* 139* 157*  BUN 11 14 19 20   CALCIUM 8.2* 8.5* 9.5 8.3*  CREATININE 0.89 1.08* 0.82 0.97  GFRNONAA >60 56* >60 >60    LIVER FUNCTION TESTS: No results for input(s): BILITOT, AST, ALT, ALKPHOS, PROT, ALBUMIN in the last 8760 hours.  TUMOR MARKERS: No results for input(s): AFPTM, CEA, CA199, CHROMGRNA in the last 8760 hours.  Assessment and Plan: Pulmonary embolus Patient s/p L1-L4 fusion repair now POD 5. She was progressing from her back surgery however prior to discharge was found to be hypoxic with O2 sats in the 80s.  VQ scan shows concern for PE.  DVT positive.  CTA performed today shows significant bilateral PE, right heart strain- confirmed by ECHO and positive troponins.  IR Consulted for thrombolysis vs. Thrombectomy.  Patient assessed at bedside.  She is currently stable and comfortably lying in bed.  Converses easily.  Denies significant shortness of breath, but remains immobile post-op.  After review of case, patient would likely be a candidate for thrombectomy and given her current stability and recent surgery. Best plan at present would be to proceed tomorrow in IR with anesthesia, PCCM on board.  Recommend continuous telemetry/O2 monitoring.  NPO p MN.  Patient may be able to return to unit post-procedure provided remains stable- defer to PCCM preference for this.   Discussed thrombectomy with patient and  her husband at bedside this afternoon.  They are agreeable to proceed.   Risks and benefits discussed with the patient including, but not limited to bleeding, possible life threatening bleeding and need for blood product  transfusion, vascular injury, stroke, contrast induced renal failure, limb loss and infection.  All of the patient's questions were answered, patient is agreeable to proceed. Consent signed and in chart.  Thank you for this interesting consult.  I greatly enjoyed meeting Karen Ramirez and look forward to participating in their care.  A copy of this report was sent to the requesting provider on this date.  Electronically Signed: Docia Barrier, PA 05/20/2020, 5:05 PM   I spent a total of 40 Minutes    in face to face in clinical consultation, greater than 50% of which was counseling/coordinating care for pulmonary embolus.

## 2020-05-20 NOTE — Progress Notes (Signed)
ANTICOAGULATION CONSULT NOTE - Follow Up Consult  Pharmacy Consult for Lovenox to Heparin Indication: pulmonary embolus  Allergies  Allergen Reactions  . Azithromycin Diarrhea  . Levaquin [Levofloxacin] Nausea And Vomiting  . Ultram [Tramadol] Nausea And Vomiting    Patient Measurements: Height: 5' 2.52" (158.8 cm) Weight: 82.6 kg (182 lb 1.6 oz) IBW/kg (Calculated) : 51.3  Vital Signs: Temp: 97.6 F (36.4 C) (05/20 1109) Temp Source: Oral (05/20 1109) BP: 92/65 (05/20 1109) Pulse Rate: 85 (05/20 1109)  Labs: Recent Labs    05/18/20 0344 05/18/20 2230 05/19/20 1509 05/19/20 2052  HGB 7.3* 9.1* 10.6* 9.7*  HCT 23.6* 29.1* 33.8* 30.8*  PLT 206 197  --   --   TROPONINIHS  --   --  49*  --     Estimated Creatinine Clearance: 54.4 mL/min (by C-G formula based on SCr of 0.97 mg/dL).  Assessment: 70 year old female s/p spinal surgery 5/14 now with PE. Received Lovenox at 11 am this morning.  Now switching to heparin for possible thrombolysis evaluation.  Goal of Therapy:  Heparin level 0.3-0.7 units/ml Monitor platelets by anticoagulation protocol: Yes   Plan:  Heparin to start at 11 pm at 1200 units / hr Daily heparin level, CBC  Thank you Anette Guarneri, PharmD [phone]  05/20/2020,2:59 PM

## 2020-05-20 NOTE — Progress Notes (Signed)
PROGRESS NOTE  Karen Ramirez TGG:[id number] DOB: 1950/05/18 DOA: 05/14/2020 PCP: Adin Hector, MD   LOS: 6 days   Brief narrative:   Karen Ramirez is a 70 y.o. female with medical history significant of hypothyroidism, hyperlipidemia, nephrolithiasis, PMR, and lumbar stenosis with herniated disks presented to hospital with continued back pain.  Patient had history of surgical decompression and fusion of L1 L4 with Dr. Ellene Route on 04/05/2020.  Following surgery patient was significantly debilitated and was transferred to skilled nursing facility for rehab but had continued to feel pain radiating down her legs.  During her postoperative visit she was noted to have L4 fracture with lost fixation.  She was then admitted back to the hospital on 514 and underwent revision surgery to compress L4 and perform fixation to the pelvis.  On postoperative day 4 patient's hemoglobin drifted down so medical team was consulted.  She was also needing some oxygen which prompted her medical attention.  Assessment/Plan:  Principal Problem:   Fatigue fracture of vertebra, lumbar region, sequela of fracture Active Problems:   Lumbar stenosis with neurogenic claudication   Pressure injury of skin   Acute pulmonary embolism (HCC)  Acute respiratory failure with hypoxia: Patient was noted to be hypoxic during hospitalization and required up to 4 L of oxygen.  VQ scan was initially performed due to lack of contrast but showed multiple defects.  DVT scan was positive for DVT in the right lower extremity as well so patient was put on Lovenox.    Acute pulmonary embolism from high probability VQ scan, right lower extremity DVT.  2D echocardiogram performed on 05/20/2020 showed severely reduced RV function with enlarged right ventricle and elevated pulmonary artery pressure at 39 consistent with acute pulmonary RV strain and severe pulmonary hypertension.  After discussing with cardiology recommendation was to  discuss with interventional radiology.  Spoke with Dr. Annamaria Boots who recommended obtaining CT angiogram for further assessment.  Patient is already on Lovenox.  We will transition to heparin infusion in case she would be a candidate for mechanical thrombectomy.  Patient will likely not be a candidate for tPA due to recent surgery within the last 6 days.  Keep n.p.o. for now just in case needs a procedure.  Continue with IV fluid hydration.  Spoke with neurosurgery Dr. Ellene Route at bedside.  He is okay with continuation of anticoagulation.  Elevated BNP and troponins likely secondary to RV strain and pulmonary embolism.:   Acute blood loss anemia likely postoperative.  Hemoglobin as low as 7.3.  Received packed RBC transfusion with stable hemoglobin at 9.7.  We will continue to monitor closely.  No mention of GI bleed in the past or any external bleeding currently.  Acute urinary retention..  Patient is currently on a Foley catheter.  Essential hypertension: Blood pressure borderline low.  Hold HCTZ for now  Hypothyroidism Continue Synthroid.   DVT prophylaxis: SCD's Start: 05/14/20 1525   Code Status: Full code  Family Communication: Spoke with the patient's husband at bedside  Status is: Inpatient  Remains inpatient appropriate because:IV treatments appropriate due to intensity of illness or inability to take PO, Inpatient level of care appropriate due to severity of illness and Need for CTA, hypoxic respiratory failure.   Dispo: The patient is from: Home              Anticipated d/c is to: SNF              Patient currently is not medically  stable to d/c.   Difficult to place patient No   Consultants:  Surgery  IR  Procedures:  Revision spine surgery.  Anti-infectives:  . None  Anti-infectives (From admission, onward)   Start     Dose/Rate Route Frequency Ordered Stop   05/14/20 1930  ceFAZolin (ANCEF) IVPB 2g/100 mL premix        2 g 200 mL/hr over 30 Minutes  Intravenous Every 8 hours 05/14/20 1525 05/15/20 0400   05/14/20 0730  ceFAZolin (ANCEF) IVPB 2g/100 mL premix        2 g 200 mL/hr over 30 Minutes Intravenous  Once 05/14/20 0728 05/14/20 1130   05/14/20 0727  ceFAZolin (ANCEF) 2-4 GM/100ML-% IVPB       Note to Pharmacy: Grace Blight   : cabinet override      05/14/20 0727 05/14/20 0740     Subjective: Today, patient was seen and examined at bedside.  Patient complains of mild leg pain, no chest pain, increasing shortness of breath cough or fever.  Objective: Vitals:   05/20/20 0815 05/20/20 1109  BP: 107/65 92/65  Pulse: 96 85  Resp: 18 16  Temp: 98.6 F (37 C) 97.6 F (36.4 C)  SpO2: 94% 96%    Intake/Output Summary (Last 24 hours) at 05/20/2020 1337 Last data filed at 05/20/2020 0955 Gross per 24 hour  Intake 240 ml  Output 2250 ml  Net -2010 ml   Filed Weights   05/14/20 0641 05/15/20 0612  Weight: 78.5 kg 82.6 kg   Body mass index is 32.75 kg/m.   Physical Exam: GENERAL: Patient is alert awake and oriented. Not in obvious distress.  On nasal cannula oxygen, obese HENT: No scleral pallor or icterus. Pupils equally reactive to light. Oral mucosa is moist NECK: is supple, no gross swelling noted. CHEST: Clear to auscultation.  CVS: S1 and S2 heard, no murmur. Regular rate and rhythm.  ABDOMEN: Soft, non-tender, bowel sounds are present. EXTREMITIES: No edema.  Mild tenderness of the left leg.  Mild edema of the right lower extremity. CNS: Cranial nerves are intact. No focal motor deficits. SKIN: warm and dry without rashes.  Data Review: I have personally reviewed the following laboratory data and studies,  CBC: Recent Labs  Lab 05/14/20 0616 05/15/20 0452 05/18/20 0344 05/18/20 2230 05/19/20 1509 05/19/20 2052  WBC 11.0* 13.7* 9.3 9.2  --   --   HGB 12.2 7.8* 7.3* 9.1* 10.6* 9.7*  HCT 40.1 25.8* 23.6* 29.1* 33.8* 30.8*  MCV 94.8 95.6 93.7 91.8  --   --   PLT 268 168 206 197  --   --    Basic  Metabolic Panel: Recent Labs  Lab 05/15/20 0452  NA 137  K 4.2  CL 102  CO2 25  GLUCOSE 157*  BUN 20  CREATININE 0.97  CALCIUM 8.3*   Liver Function Tests: No results for input(s): AST, ALT, ALKPHOS, BILITOT, PROT, ALBUMIN in the last 168 hours. No results for input(s): LIPASE, AMYLASE in the last 168 hours. No results for input(s): AMMONIA in the last 168 hours. Cardiac Enzymes: No results for input(s): CKTOTAL, CKMB, CKMBINDEX, TROPONINI in the last 168 hours. BNP (last 3 results) Recent Labs    05/19/20 0951  BNP 666.3*    ProBNP (last 3 results) No results for input(s): PROBNP in the last 8760 hours.  CBG: No results for input(s): GLUCAP in the last 168 hours. Recent Results (from the past 240 hour(s))  SARS CORONAVIRUS 2 (TAT 6-24 HRS)  Nasopharyngeal Nasopharyngeal Swab     Status: None   Collection Time: 05/12/20  3:26 PM   Specimen: Nasopharyngeal Swab  Result Value Ref Range Status   SARS Coronavirus 2 NEGATIVE NEGATIVE Final    Comment: (NOTE) SARS-CoV-2 target nucleic acids are NOT DETECTED.  The SARS-CoV-2 RNA is generally detectable in upper and lower respiratory specimens during the acute phase of infection. Negative results do not preclude SARS-CoV-2 infection, do not rule out co-infections with other pathogens, and should not be used as the sole basis for treatment or other patient management decisions. Negative results must be combined with clinical observations, patient history, and epidemiological information. The expected result is Negative.  Fact Sheet for Patients: SugarRoll.be  Fact Sheet for Healthcare Providers: https://www.woods-mathews.com/  This test is not yet approved or cleared by the [place] FDA and  has been authorized for detection and/or diagnosis of SARS-CoV-2 by FDA under an Emergency Use Authorization (EUA). This EUA will remain  in effect (meaning this test can be used) for  the duration of the COVID-19 declaration under Se ction 564(b)(1) of the Act, 21 U.S.C. section 360bbb-3(b)(1), unless the authorization is terminated or revoked sooner.  Performed at [facility], [place] [address], [place], [place] [address]   Surgical pcr screen     Status: Abnormal   Collection Time: 05/12/20  3:27 PM   Specimen: Nasal Mucosa; Nasal Swab  Result Value Ref Range Status   MRSA, PCR NEGATIVE NEGATIVE Final   Staphylococcus aureus POSITIVE (A) NEGATIVE Final    Comment: (NOTE) The Xpert SA Assay (FDA approved for NASAL specimens in patients 59 years of age and older), is one component of a comprehensive surveillance program. It is not intended to diagnose infection nor to guide or monitor treatment. Performed at [facility], [place] [address], [place], [place] [address]   SARS CORONAVIRUS 2 (TAT 6-24 HRS) Nasopharyngeal Nasopharyngeal Swab     Status: None   Collection Time: 05/18/20  1:45 PM   Specimen: Nasopharyngeal Swab  Result Value Ref Range Status   SARS Coronavirus 2 NEGATIVE NEGATIVE Final    Comment: (NOTE) SARS-CoV-2 target nucleic acids are NOT DETECTED.  The SARS-CoV-2 RNA is generally detectable in upper and lower respiratory specimens during the acute phase of infection. Negative results do not preclude SARS-CoV-2 infection, do not rule out co-infections with other pathogens, and should not be used as the sole basis for treatment or other patient management decisions. Negative results must be combined with clinical observations, patient history, and epidemiological information. The expected result is Negative.  Fact Sheet for Patients: SugarRoll.be  Fact Sheet for Healthcare Providers: https://www.woods-mathews.com/  This test is not yet approved or cleared by the [place] FDA and  has been authorized for detection and/or diagnosis of SARS-CoV-2 by FDA under an Emergency Use  Authorization (EUA). This EUA will remain  in effect (meaning this test can be used) for the duration of the COVID-19 declaration under Se ction 564(b)(1) of the Act, 21 U.S.C. section 360bbb-3(b)(1), unless the authorization is terminated or revoked sooner.  Performed at [facility], [place] [address], [place], [place] [address]      Studies: NM Pulmonary Perfusion  Result Date: 05/19/2020 CLINICAL DATA:  Shortness of breath and positive D-dimer EXAM: NUCLEAR MEDICINE PERFUSION LUNG SCAN TECHNIQUE: Perfusion images were obtained in multiple projections after intravenous injection of radiopharmaceutical. Ventilation scans intentionally deferred if perfusion scan and chest x-ray adequate for interpretation during COVID 19  epidemic. RADIOPHARMACEUTICALS:  Four mCi Tc-70m MAA IV COMPARISON:  Chest from earlier in the same day. FINDINGS: Multiple wedge like defects are identified bilaterally as well as near absence of perfusion in the right lung base. These changes would be consistent with multifocal pulmonary emboli. IMPRESSION: Multiple defects bilaterally with a normal appearing chest x-ray consistent with pulmonary embolism. Electronically Signed   By: Inez Catalina M.D.   On: 05/19/2020 15:52   DG CHEST PORT 1 VIEW  Result Date: 05/19/2020 CLINICAL DATA:  Shortness of breath EXAM: PORTABLE CHEST 1 VIEW COMPARISON:  Chest CT October 09, 2018 FINDINGS: Lungs are clear. Heart size and pulmonary vascularity are normal. No adenopathy. No bone lesions. IMPRESSION: Lungs clear.  Cardiac silhouette normal. Electronically Signed   By: Lowella Grip III M.D.   On: 05/19/2020 09:24   ECHOCARDIOGRAM COMPLETE  Result Date: 05/20/2020    ECHOCARDIOGRAM REPORT   Patient Name:   Karen Ramirez Date of Exam: 05/20/2020 Medical Rec #:  [id number]          Height:       62.5 in Accession #:    [id number]         Weight:       182.1 lb Date of Birth:  1950/01/17          BSA:          1.848 m Patient Age:     2 years           BP:           107/65 mmHg Patient Gender: F                  HR:           96 bpm. Exam Location:  Inpatient Procedure: 2D Echo, Cardiac Doppler and Color Doppler Indications:    CHF-Acute Diastolic  History:        Patient has no prior history of Echocardiogram examinations.                 Acute pulmonary embolus. Hx heart arrythmia. GERD.  Sonographer:    Clayton Lefort RDCS (AE) Referring Phys: [id number] RONDELL A SMITH IMPRESSIONS  1. Right ventricular systolic function is severely reduced. The right ventricular size is severely enlarged. There is severely elevated pulmonary artery systolic pressure. The estimated right ventricular systolic pressure is 22.0 mmHg. Findings consistent with acute pulmonary with significant RV strain and severe pulmonary hypertension.  2. Left ventricular ejection fraction, by estimation, is 60 to 65%. The left ventricle has normal function. The left ventricle has no regional wall motion abnormalities. Left ventricular diastolic parameters are consistent with Grade I diastolic dysfunction (impaired relaxation). There is the interventricular septum is flattened in systole, consistent with right ventricular pressure overload.  3. The mitral valve is grossly normal. Trivial mitral valve regurgitation.  4. Tricuspid valve regurgitation is moderate.  5. The aortic valve is tricuspid. There is mild calcification of the aortic valve. There is moderate thickening of the aortic valve. Aortic valve regurgitation is not visualized. Mild to moderate aortic valve sclerosis/calcification is present, without any evidence of aortic stenosis.  6. The inferior vena cava is dilated in size with <50% respiratory variability, suggesting right atrial pressure of 15 mmHg. Comparison(s): No prior Echocardiogram. Conclusion(s)/Recommendation(s): Patient with severe RV dysfunction and pulmonary hypertension with acute PE. Consider Cardiology evaluation if clinically indicated as may benefit  from EKOS if cleared from a surgical standpoint. FINDINGS  Left Ventricle: Left  ventricular ejection fraction, by estimation, is 60 to 65%. The left ventricle has normal function. The left ventricle has no regional wall motion abnormalities. The left ventricular internal cavity size was normal in size. There is  no left ventricular hypertrophy. The interventricular septum is flattened in systole, consistent with right ventricular pressure overload. Left ventricular diastolic parameters are consistent with Grade I diastolic dysfunction (impaired relaxation). Right Ventricle: The right ventricular size is severely enlarged. No increase in right ventricular wall thickness. Right ventricular systolic function is severely reduced. There is severely elevated pulmonary artery systolic pressure. The tricuspid regurgitant velocity is 4.28 m/s, and with an assumed right atrial pressure of 15 mmHg, the estimated right ventricular systolic pressure is 123XX123 mmHg. Left Atrium: Left atrial size was normal in size. Right Atrium: Right atrial size was normal in size. Pericardium: There is no evidence of pericardial effusion. Mitral Valve: The mitral valve is grossly normal. Trivial mitral valve regurgitation. MV peak gradient, 3.1 mmHg. The mean mitral valve gradient is 1.0 mmHg. Tricuspid Valve: The tricuspid valve is normal in structure. Tricuspid valve regurgitation is moderate. Aortic Valve: The aortic valve is tricuspid. There is mild calcification of the aortic valve. There is moderate thickening of the aortic valve. Aortic valve regurgitation is not visualized. Mild to moderate aortic valve sclerosis/calcification is present, without any evidence of aortic stenosis. Aortic valve mean gradient measures 2.0 mmHg. Aortic valve peak gradient measures 4.3 mmHg. Aortic valve area, by VTI measures 2.70 cm. Pulmonic Valve: The pulmonic valve was normal in structure. Pulmonic valve regurgitation is trivial. Aorta: The aortic root and  ascending aorta are structurally normal, with no evidence of dilitation. Venous: The inferior vena cava is dilated in size with less than 50% respiratory variability, suggesting right atrial pressure of 15 mmHg. IAS/Shunts: No atrial level shunt detected by color flow Doppler.  LEFT VENTRICLE PLAX 2D LVIDd:         3.50 cm  Diastology LVIDs:         2.10 cm  LV e' medial:    5.11 cm/s LV PW:         1.00 cm  LV E/e' medial:  9.8 LV IVS:        1.00 cm  LV e' lateral:   7.40 cm/s LVOT diam:     1.90 cm  LV E/e' lateral: 6.8 LV SV:         33 LV SV Index:   18 LVOT Area:     2.84 cm  RIGHT VENTRICLE            IVC RV Basal diam:  3.40 cm    IVC diam: 2.40 cm RV S prime:     9.14 cm/s TAPSE (M-mode): 1.4 cm LEFT ATRIUM           Index       RIGHT ATRIUM           Index LA diam:      2.20 cm 1.19 cm/m  RA Area:     15.10 cm LA Vol (A4C): 29.2 ml 15.80 ml/m RA Volume:   35.50 ml  19.21 ml/m  AORTIC VALVE AV Area (Vmax):    2.30 cm AV Area (Vmean):   2.76 cm AV Area (VTI):     2.70 cm AV Vmax:           104.00 cm/s AV Vmean:          56.800 cm/s AV VTI:  0.123 m AV Peak Grad:      4.3 mmHg AV Mean Grad:      2.0 mmHg LVOT Vmax:         84.20 cm/s LVOT Vmean:        55.200 cm/s LVOT VTI:          0.117 m LVOT/AV VTI ratio: 0.95  AORTA Ao Root diam: 3.60 cm Ao Asc diam:  2.90 cm MITRAL VALVE               TRICUSPID VALVE MV Area (PHT): 2.46 cm    TR Peak grad:   73.3 mmHg MV Area VTI:   1.69 cm    TR Vmax:        428.00 cm/s MV Peak grad:  3.1 mmHg MV Mean grad:  1.0 mmHg    SHUNTS MV Vmax:       0.88 m/s    Systemic VTI:  0.12 m MV Vmean:      52.5 cm/s   Systemic Diam: 1.90 cm MV Decel Time: 309 msec MV E velocity: 50.10 cm/s MV A velocity: 75.80 cm/s MV E/A ratio:  0.66 Gwyndolyn Kaufman MD Electronically signed by Gwyndolyn Kaufman MD Signature Date/Time: 05/20/2020/1:22:39 PM    Final    VAS [place] LOWER EXTREMITY VENOUS (DVT)  Result Date: 05/19/2020  Lower Venous DVT Study Patient Name:  ERALYN NAGAR  Date of Exam:   05/19/2020 Medical Rec #: [id number]           Accession #:    [id number] Date of Birth: 06/05/1950           Patient Gender: F Patient Age:   70Y Exam Location[facility]  [facility] Procedure:      VAS [place] LOWER EXTREMITY VENOUS (DVT) Referring Phys: [id number] RONDELL A SMITH --------------------------------------------------------------------------------  Indications: Swelling.  Risk Factors: None identified. Limitations: Poor ultrasound/tissue interface and body habitus. Comparison Study: No prior studies. Performing Technologist: Oliver Hum RVT  Examination Guidelines: A complete evaluation includes B-mode imaging, spectral Doppler, color Doppler, and power Doppler as needed of all accessible portions of each vessel. Bilateral testing is considered an integral part of a complete examination. Limited examinations for reoccurring indications may be performed as noted. The reflux portion of the exam is performed with the patient in reverse Trendelenburg.  +---------+---------------+---------+-----------+----------+--------------+ RIGHT    CompressibilityPhasicitySpontaneityPropertiesThrombus Aging +---------+---------------+---------+-----------+----------+--------------+ CFV      Full           Yes      Yes                                 +---------+---------------+---------+-----------+----------+--------------+ SFJ      Full                                                        +---------+---------------+---------+-----------+----------+--------------+ FV Prox  Full                                                        +---------+---------------+---------+-----------+----------+--------------+ FV Mid   Full                                                        +---------+---------------+---------+-----------+----------+--------------+  FV DistalFull                                                         +---------+---------------+---------+-----------+----------+--------------+ PFV      Full                                                        +---------+---------------+---------+-----------+----------+--------------+ POP      Partial        Yes      Yes                  Acute          +---------+---------------+---------+-----------+----------+--------------+ PTV      Full                                                        +---------+---------------+---------+-----------+----------+--------------+ PERO     None                                         Acute          +---------+---------------+---------+-----------+----------+--------------+ Gastroc  Full                                                        +---------+---------------+---------+-----------+----------+--------------+   +---------+---------------+---------+-----------+----------+--------------+ LEFT     CompressibilityPhasicitySpontaneityPropertiesThrombus Aging +---------+---------------+---------+-----------+----------+--------------+ CFV      Full           Yes      Yes                                 +---------+---------------+---------+-----------+----------+--------------+ SFJ      Full                                                        +---------+---------------+---------+-----------+----------+--------------+ FV Prox  Full                                                        +---------+---------------+---------+-----------+----------+--------------+ FV Mid   Full                                                        +---------+---------------+---------+-----------+----------+--------------+  FV Distal               Yes      Yes                                 +---------+---------------+---------+-----------+----------+--------------+ PFV      Full                                                         +---------+---------------+---------+-----------+----------+--------------+ POP      Partial        Yes      Yes                                 +---------+---------------+---------+-----------+----------+--------------+ PTV      None                                         Acute          +---------+---------------+---------+-----------+----------+--------------+ PERO     None                                         Acute          +---------+---------------+---------+-----------+----------+--------------+ Gastroc  Partial                                      Acute          +---------+---------------+---------+-----------+----------+--------------+    Summary: RIGHT: - Findings consistent with acute deep vein thrombosis involving the right popliteal vein, and right peroneal veins. - No cystic structure found in the popliteal fossa.  LEFT: - Findings consistent with acute deep vein thrombosis involving the left popliteal vein, left posterior tibial veins, left peroneal veins, and left gastrocnemius veins. - No cystic structure found in the popliteal fossa.  *See table(s) above for measurements and observations.    Preliminary      Flora Lipps, MD  [facility] 05/20/2020  If 7PM-7AM, please contact night-coverage

## 2020-05-20 NOTE — Progress Notes (Signed)
Patient ID: Karen Ramirez, female   DOB: 1950/03/12, 70 y.o.   MRN: [id number] Appreciate help of hospitalist service.  Reviewed notes ans see plan for IR procedure. I will be out of town for the next week but if anything is needed from a neurosurgical perspective my partners are available to discuss and consult. I will ask Dr. Zada Finders to look in on Karen Ramirez specifically.

## 2020-05-20 NOTE — Progress Notes (Signed)
  Echocardiogram 2D Echocardiogram has been performed.  Karen Ramirez 05/20/2020, 11:05 AM

## 2020-05-20 NOTE — Progress Notes (Signed)
Continuous pulse ox connected to Pt as per order.

## 2020-05-20 NOTE — TOC Benefit Eligibility Note (Signed)
Patient Teacher, English as a foreign language completed.    The patient is currently admitted and upon discharge could be taking Eliquis 5 mg.  The current 30 day co-pay is, $296.14 due to a  $215.34 deductible remailing.   The patient is currently admitted and upon discharge could be taking Xarelto 20 mg.  The current 30 day co-pay is, $293.09 due to a  $215.34 deductible remailing.   The patient is insured through [place] Patient Advocate Specialist Ronald Team Direct Number: [phone]  Fax: [phone]

## 2020-05-21 ENCOUNTER — Inpatient Hospital Stay (HOSPITAL_COMMUNITY): Payer: Medicare Other

## 2020-05-21 ENCOUNTER — Inpatient Hospital Stay (HOSPITAL_COMMUNITY): Payer: Medicare Other | Admitting: Certified Registered"

## 2020-05-21 ENCOUNTER — Encounter (HOSPITAL_COMMUNITY): Payer: Self-pay | Admitting: Neurological Surgery

## 2020-05-21 ENCOUNTER — Encounter (HOSPITAL_COMMUNITY)
Admission: RE | Disposition: A | Discharge status: Skilled Nursing Facility | Payer: Self-pay | Source: Home / Self Care | Attending: Neurological Surgery

## 2020-05-21 HISTORY — PX: IR THROMBECT PRIM MECH INIT (INCLU) MOD SED: IMG2297

## 2020-05-21 HISTORY — PX: IR ANGIOGRAM SELECTIVE EACH ADDITIONAL VESSEL: IMG667

## 2020-05-21 HISTORY — PX: IR VENOCAVAGRAM SVC: IMG679

## 2020-05-21 HISTORY — PX: IR US GUIDE VASC ACCESS RIGHT: IMG2390

## 2020-05-21 HISTORY — PX: RADIOLOGY WITH ANESTHESIA: SHX6223

## 2020-05-21 HISTORY — PX: IR ANGIOGRAM PULMONARY BILATERAL SELECTIVE: IMG664

## 2020-05-21 LAB — POCT ACTIVATED CLOTTING TIME
Activated Clotting Time: 142 seconds
Activated Clotting Time: 207 seconds
Activated Clotting Time: 255 seconds

## 2020-05-21 LAB — HEPARIN LEVEL (UNFRACTIONATED)
Heparin Unfractionated: >1.1 IU/mL — ABNORMAL HIGH (ref 0.30–0.70)
Heparin Unfractionated: >1.1 IU/mL — ABNORMAL HIGH (ref 0.30–0.70)

## 2020-05-21 LAB — SURGICAL PCR SCREEN
MRSA, PCR: NEGATIVE
Staphylococcus aureus: NEGATIVE

## 2020-05-21 LAB — CBC
HCT: 30.4 % — ABNORMAL LOW (ref 36.0–46.0)
Hemoglobin: 9.3 g/dL — ABNORMAL LOW (ref 12.0–15.0)
MCH: 28.3 pg (ref 26.0–34.0)
MCHC: 30.6 g/dL (ref 30.0–36.0)
MCV: 92.4 fL (ref 80.0–100.0)
Platelets: 213 10*3/uL (ref 150–400)
RBC: 3.29 MIL/uL — ABNORMAL LOW (ref 3.87–5.11)
RDW: 14.7 % (ref 11.5–15.5)
WBC: 9.7 10*3/uL (ref 4.0–10.5)
nRBC: 0.6 % — ABNORMAL HIGH (ref 0.0–0.2)

## 2020-05-21 SURGERY — IR WITH ANESTHESIA
Anesthesia: Monitor Anesthesia Care

## 2020-05-21 MED ORDER — FENTANYL CITRATE (PF) 100 MCG/2ML IJ SOLN
INTRAMUSCULAR | Status: DC | PRN
  Administered 2020-05-21: 5 ug via INTRAVENOUS
  Administered 2020-05-21: 10 ug via INTRAVENOUS
  Administered 2020-05-21 (×2): 5 ug via INTRAVENOUS

## 2020-05-21 MED ORDER — PROPOFOL 500 MG/50ML IV EMUL
INTRAVENOUS | Status: DC | PRN
  Administered 2020-05-21: 25 ug/kg/min via INTRAVENOUS

## 2020-05-21 MED ORDER — IOHEXOL 300 MG/ML  SOLN
100.0000 mL | Freq: Once | INTRAMUSCULAR | Status: AC | PRN
  Administered 2020-05-21: 65 mL via INTRAVENOUS

## 2020-05-21 MED ORDER — HEPARIN (PORCINE) 25000 UT/250ML-% IV SOLN
1000.0000 [IU]/h | INTRAVENOUS | Status: DC
  Administered 2020-05-21: 1000 [IU]/h via INTRAVENOUS
  Filled 2020-05-21: qty 250

## 2020-05-21 MED ORDER — NOREPINEPHRINE 4 MG/250ML-% IV SOLN
0.0000 ug/min | INTRAVENOUS | Status: DC
Start: 2020-05-21 — End: 2020-05-22
  Filled 2020-05-21: qty 250

## 2020-05-21 MED ORDER — MIDAZOLAM HCL 5 MG/5ML IJ SOLN
INTRAMUSCULAR | Status: DC | PRN
  Administered 2020-05-21 (×2): .5 mg via INTRAVENOUS

## 2020-05-21 MED ORDER — ORAL CARE MOUTH RINSE: 15.0000 mL | Freq: Once | OROMUCOSAL | Status: AC

## 2020-05-21 MED ORDER — CHLORHEXIDINE GLUCONATE 0.12 % MT SOLN: 15.0000 mL | Freq: Once | OROMUCOSAL | Status: AC

## 2020-05-21 MED ORDER — FAMOTIDINE IN NACL 20-0.9 MG/50ML-% IV SOLN
20.0000 mg | Freq: Once | INTRAVENOUS | Status: DC
  Filled 2020-05-21 (×3): qty 50

## 2020-05-21 MED ORDER — CHLORHEXIDINE GLUCONATE 0.12 % MT SOLN
OROMUCOSAL | Status: AC
  Administered 2020-05-21: 15 mL via OROMUCOSAL
  Filled 2020-05-21: qty 15

## 2020-05-21 MED ORDER — LACTATED RINGERS IV SOLN: INTRAVENOUS | Status: DC

## 2020-05-21 MED ORDER — HEPARIN SODIUM (PORCINE) 1000 UNIT/ML IJ SOLN
INTRAMUSCULAR | Status: DC | PRN
  Administered 2020-05-21: 2500 [IU] via INTRAVENOUS
  Administered 2020-05-21: 5000 [IU] via INTRAVENOUS

## 2020-05-21 MED ORDER — LIDOCAINE HCL (PF) 1 % IJ SOLN
INTRAMUSCULAR | Status: AC
  Filled 2020-05-21: qty 30

## 2020-05-21 MED ORDER — VASOPRESSIN 20 UNITS/100 ML INFUSION FOR SHOCK
0.0000 [IU]/min | INTRAVENOUS | Status: AC
  Filled 2020-05-21: qty 100

## 2020-05-21 MED ORDER — HEPARIN (PORCINE) 25000 UT/250ML-% IV SOLN
1000.0000 [IU]/h | INTRAVENOUS | Status: DC
  Administered 2020-05-21: 800 [IU]/h via INTRAVENOUS
  Administered 2020-05-22: 900 [IU]/h via INTRAVENOUS
  Filled 2020-05-21 (×2): qty 250

## 2020-05-21 NOTE — Progress Notes (Addendum)
ANTICOAGULATION CONSULT NOTE - Follow Up Consult  Pharmacy Consult for Lovenox to Heparin Indication: pulmonary embolus  Allergies  Allergen Reactions  . Azithromycin Diarrhea  . Levaquin [Levofloxacin] Nausea And Vomiting  . Ultram [Tramadol] Nausea And Vomiting    Patient Measurements: Height: 5' 2.52" (158.8 cm) Weight: 82.6 kg (182 lb 1.6 oz) IBW/kg (Calculated) : 51.3  Kg Heparin dosing weight: 69.6 kg    Vital Signs: Temp: 98.1 F (36.7 C) (05/21 1352) Temp Source: Oral (05/21 1352) BP: 111/70 (05/21 1352) Pulse Rate: 68 (05/21 1352)  Labs: Recent Labs    05/18/20 2230 05/19/20 1509 05/19/20 2052 05/21/20 0440 05/21/20 1415  HGB 9.1* 10.6* 9.7* 9.3*  --   HCT 29.1* 33.8* 30.8* 30.4*  --   PLT 197  --   --  213  --   HEPARINUNFRC  --   --   --  >1.10* >1.10*  TROPONINIHS  --  49*  --   --   --     Estimated Creatinine Clearance: 54.4 mL/min (by C-G formula based on SCr of 0.97 mg/dL).  Assessment: 70 year old female s/p spinal surgery 5/14 now with PE. Received Lovenox at 11 am on 5/20.   Switched to IV heparin  -s/p IR for mechanical thrombectomy. It appears that 7500 units of heparin was given in procedure per anesthesia records -heparin level remains > 1.1 (the lab was collected from the patients hand)   Goal of Therapy:  Heparin level 0.3-0.7 units/ml Monitor platelets by anticoagulation protocol: Yes   Plan:  Hold heparin drip x 1 hour then resume heparin at reduced rate 800 units/hr Heparin level in 6 hours and daily wth CBC daily    Hildred Laser, PharmD Clinical Pharmacist **Pharmacist phone directory can now be found on [email or web address] ([email or web address]).  Listed under [place].

## 2020-05-21 NOTE — Transfer of Care (Signed)
Immediate Anesthesia Transfer of Care Note  Patient: Karen Ramirez  Procedure(s) Performed: IR WITH ANESTHESIA (N/A )  Patient Location: PACU  Anesthesia Type:MAC  Level of Consciousness: awake, alert , oriented and patient cooperative  Airway & Oxygen Therapy: Patient Spontanous Breathing and Patient connected to nasal cannula oxygen  Post-op Assessment: Report given to RN and Post -op Vital signs reviewed and stable  Post vital signs: Reviewed and stable  Last Vitals:  Vitals Value Taken Time  BP 115/69 05/21/20 1230  Temp    Pulse 68 05/21/20 1238  Resp 11 05/21/20 1238  SpO2 96 % 05/21/20 1238  Vitals shown include unvalidated device data.  Last Pain:  Vitals:   05/21/20 0929  TempSrc:   PainSc: 0-No pain      Patients Stated Pain Goal: 0 (67/54/49 2010)  Complications: No complications documented.

## 2020-05-21 NOTE — Plan of Care (Signed)

## 2020-05-21 NOTE — Progress Notes (Addendum)
IR Thrombectomy Procedure  Pre: PA Pressure: 50/16 (29 mean)  Post: PA Pressure: 48/10 (23 mean)

## 2020-05-21 NOTE — Anesthesia Procedure Notes (Signed)
Arterial Line Insertion Start/End5/21/2022 9:45 AM, 05/21/2020 9:47 AM Performed by: Darral Dash, DO, anesthesiologist  Patient location: Pre-op. Preanesthetic checklist: patient identified, IV checked, site marked, risks and benefits discussed, surgical consent, monitors and equipment checked, pre-op evaluation, timeout performed and anesthesia consent Lidocaine 1% used for infiltration Left, radial was placed Catheter size: 20 Fr Hand hygiene performed  and maximum sterile barriers used   Attempts: 3 Procedure performed using ultrasound guided technique. Following insertion, dressing applied and Biopatch. Post procedure assessment: normal and unchanged  Post procedure complications: second provider assisted. Additional procedure comments: Initial attempts via palpation method by CRNA team unsuccessful without wire able to thread. Single attempt by self under US guidance. Marland Kitchen

## 2020-05-21 NOTE — Progress Notes (Signed)
ANTICOAGULATION CONSULT NOTE - Follow Up Consult  Pharmacy Consult for Lovenox to Heparin Indication: pulmonary embolus  Allergies  Allergen Reactions  . Azithromycin Diarrhea  . Levaquin [Levofloxacin] Nausea And Vomiting  . Ultram [Tramadol] Nausea And Vomiting    Patient Measurements: Height: 5' 2.52" (158.8 cm) Weight: 82.6 kg (182 lb 1.6 oz) IBW/kg (Calculated) : 51.3  Kg Heparin dosing weight: 69.6 kg    Vital Signs: Temp: 97.7 F (36.5 C) (05/21 0345) Temp Source: Oral (05/21 0345) BP: 112/75 (05/21 0345) Pulse Rate: 71 (05/21 0345)  Labs: Recent Labs    05/18/20 2230 05/19/20 1509 05/19/20 2052 05/21/20 0440  HGB 9.1* 10.6* 9.7* 9.3*  HCT 29.1* 33.8* 30.8* 30.4*  PLT 197  --   --  213  HEPARINUNFRC  --   --   --  >1.10*  TROPONINIHS  --  49*  --   --     Estimated Creatinine Clearance: 54.4 mL/min (by C-G formula based on SCr of 0.97 mg/dL).  Assessment: 70 year old female s/p spinal surgery 5/14 now with PE. Received Lovenox at 11 am on 5/20.   Switched to IV heparin for possible thrombolysis evaluation.  Initial  6 hour heparin level is >1.10 on heparin rate 1200 units/hr.  Supra-therapeutic level.   No bleeding noted, cofirmed per RN's report.  Heparin level appears drawn correctly from arm opposite of where heparin infusion. Will  Hold heparin for 1 hr then resume at lower rate.  Goal of Therapy:  Heparin level 0.3-0.7 units/ml Monitor platelets by anticoagulation protocol: Yes   Plan:  Hold heparin drip x 1 hour then resume heparin at reduced rate 1000 units/hr Check 6 hour heparin level. Daily heparin level, CBC  Thank you Nicole Cella, RPh Clinical Pharmacist [phone] Please check AMION for all [place] phone numbers After 10:00 PM, call [place] [phone] 05/21/2020,6:12 AM

## 2020-05-21 NOTE — Progress Notes (Signed)
PROGRESS NOTE  CAM MOSCHETTO [id number] DOB: Dec 13, 1950 DOA: 05/14/2020 PCP: Adin Hector, MD   LOS: 7 days   Brief narrative:   Karen Ramirez is a 70 y.o. female with medical history significant of hypothyroidism, hyperlipidemia, nephrolithiasis, PMR, and lumbar stenosis with herniated disks presented to hospital with continued back pain.  Patient had history of surgical decompression and fusion of L1 L4 with Dr. Ellene Route on 04/05/2020.  Following surgery patient was significantly debilitated and was transferred to skilled nursing facility for rehab but had continued to feel pain radiating down her legs.  During her postoperative visit she was noted to have L4 fracture with lost fixation.  She was then admitted back to the hospital on 514 and underwent revision surgery to compress L4 and perform fixation to the pelvis.  On postoperative day 4 patient's hemoglobin drifted down so medical team was consulted.  She was also needing some oxygen which prompted her medical attention.  Assessment/Plan:  Principal Problem:   Fatigue fracture of vertebra, lumbar region, sequela of fracture Active Problems:   Lumbar stenosis with neurogenic claudication   Pressure injury of skin   Acute pulmonary embolism (HCC)  Acute respiratory failure with hypoxia:  Patient was noted to be hypoxic during hospitalization and required up to 4 L of oxygen.  VQ scan was initially performed due to lack of contrast but showed multiple defects.  DVT scan was positive for DVT in the right lower extremity as well so patient was put on Lovenox.  Subsequently it was changed to heparin drip after CT angiogram showed pulmonary embolism.  Acute pulmonary embolism from high probability VQ scan, right lower extremity DVT.  2D echocardiogram performed on 05/20/2020 showed severely reduced RV function with enlarged right ventricle and elevated pulmonary artery pressure at 61 consistent with acute pulmonary RV strain and  severe pulmonary hypertension.  After discussing with cardiology recommendation was to discuss with interventional radiology.  CT angiogram was done and the recommendation was to proceed with mechanical thrombectomy.  Patient was initiated on heparin drip and underwent mechanical thrombectomy on 05/21/2020.  Neurosurgery is okay with anticoagulation options.  Status post revision spine surgery.  Management as per neurosurgical team.  Elevated BNP and troponins secondary to RV strain and pulmonary embolism.  Acute blood loss anemia likely postoperative.  Hemoglobin as low as 7.3.  Received packed RBC transfusion with stable hemoglobin at 9.7>9.3.   No mention of GI bleed in the past or any external bleeding currently.  Acute urinary retention.  Patient is currently on a Foley catheter.  Essential hypertension: Blood pressure borderline low.  Hold HCTZ for now   Hypothyroidism Continue Synthroid.   DVT prophylaxis: SCD's Start: 05/14/20 1525   Code Status: Full code  Family Communication:  I spoke with the patient's husband at bedside  Status is: Inpatient  Remains inpatient appropriate because:IV treatments appropriate due to intensity of illness or inability to take PO, Inpatient level of care appropriate due to severity of illness, status post mechanical thrombectomy   Dispo: The patient is from: Home              Anticipated d/c is to: SNF              Patient currently is not medically stable to d/c.   Difficult to place patient No  Consultants:  Neuro surgery  IR  Procedures:  Revision spine surgery by neurosurgery  Mechanical thrombectomy on 05/21/2020  Anti-infectives:  . None  Anti-infectives (  From admission, onward)   Start     Dose/Rate Route Frequency Ordered Stop   05/14/20 1930  ceFAZolin (ANCEF) IVPB 2g/100 mL premix        2 g 200 mL/hr over 30 Minutes Intravenous Every 8 hours 05/14/20 1525 05/15/20 0400   05/14/20 0730  ceFAZolin (ANCEF) IVPB  2g/100 mL premix        2 g 200 mL/hr over 30 Minutes Intravenous  Once 05/14/20 0728 05/14/20 1130   05/14/20 0727  ceFAZolin (ANCEF) 2-4 GM/100ML-% IVPB       Note to Pharmacy: Aquilla HackerWalton, Susan   : cabinet override      05/14/20 0727 05/14/20 0740     Subjective: Today, patient was seen and examined at bedside.  Seen before thrombectomy.  Denies any chest pain, increasing shortness of breath, fever, nausea or vomiting.  Has mild back pain.    Objective: Vitals:   05/21/20 1315 05/21/20 1352  BP: 98/66 111/70  Pulse: 71 68  Resp: 12 20  Temp: 98 F (36.7 C) 98.1 F (36.7 C)  SpO2: 96% 95%    Intake/Output Summary (Last 24 hours) at 05/21/2020 1439 Last data filed at 05/21/2020 1214 Gross per 24 hour  Intake 261.45 ml  Output 1600 ml  Net -1338.55 ml   Filed Weights   05/14/20 0641 05/15/20 0612  Weight: 78.5 kg 82.6 kg   Body mass index is 32.75 kg/m.   Physical Exam:  GENERAL: Patient is alert awake and oriented. Not in obvious distress.  On nasal cannula oxygen, obese HENT: No scleral pallor or icterus. Pupils equally reactive to light. Oral mucosa is moist NECK: is supple, no gross swelling noted. CHEST: Clear to auscultation.  Diminished breath sounds bilaterally. CVS: S1 and S2 heard, no murmur. Regular rate and rhythm.  ABDOMEN: Soft, non-tender, bowel sounds are present. EXTREMITIES: No edema.  Mild tenderness of the left leg.  Mild edema of the right lower extremity. CNS: Cranial nerves are intact. No focal motor deficits. SKIN: warm and dry without rashes.  Data Review: I have personally reviewed the following laboratory data and studies,  CBC: Recent Labs  Lab 05/15/20 0452 05/18/20 0344 05/18/20 2230 05/19/20 1509 05/19/20 2052 05/21/20 0440  WBC 13.7* 9.3 9.2  --   --  9.7  HGB 7.8* 7.3* 9.1* 10.6* 9.7* 9.3*  HCT 25.8* 23.6* 29.1* 33.8* 30.8* 30.4*  MCV 95.6 93.7 91.8  --   --  92.4  PLT 168 206 197  --   --  213   Basic Metabolic  Panel: Recent Labs  Lab 05/15/20 0452  NA 137  K 4.2  CL 102  CO2 25  GLUCOSE 157*  BUN 20  CREATININE 0.97  CALCIUM 8.3*   Liver Function Tests: No results for input(s): AST, ALT, ALKPHOS, BILITOT, PROT, ALBUMIN in the last 168 hours. No results for input(s): LIPASE, AMYLASE in the last 168 hours. No results for input(s): AMMONIA in the last 168 hours. Cardiac Enzymes: No results for input(s): CKTOTAL, CKMB, CKMBINDEX, TROPONINI in the last 168 hours. BNP (last 3 results) Recent Labs    05/19/20 0951  BNP 666.3*    ProBNP (last 3 results) No results for input(s): PROBNP in the last 8760 hours.  CBG: No results for input(s): GLUCAP in the last 168 hours. Recent Results (from the past 240 hour(s))  SARS CORONAVIRUS 2 (TAT 6-24 HRS) Nasopharyngeal Nasopharyngeal Swab     Status: None   Collection Time: 05/12/20  3:26 PM  Specimen: Nasopharyngeal Swab  Result Value Ref Range Status   SARS Coronavirus 2 NEGATIVE NEGATIVE Final    Comment: (NOTE) SARS-CoV-2 target nucleic acids are NOT DETECTED.  The SARS-CoV-2 RNA is generally detectable in upper and lower respiratory specimens during the acute phase of infection. Negative results do not preclude SARS-CoV-2 infection, do not rule out co-infections with other pathogens, and should not be used as the sole basis for treatment or other patient management decisions. Negative results must be combined with clinical observations, patient history, and epidemiological information. The expected result is Negative.  Fact Sheet for Patients: SugarRoll.be  Fact Sheet for Healthcare Providers: https://www.woods-mathews.com/  This test is not yet approved or cleared by the [place] FDA and  has been authorized for detection and/or diagnosis of SARS-CoV-2 by FDA under an Emergency Use Authorization (EUA). This EUA will remain  in effect (meaning this test can be used) for the duration  of the COVID-19 declaration under Se ction 564(b)(1) of the Act, 21 U.S.C. section 360bbb-3(b)(1), unless the authorization is terminated or revoked sooner.  Performed at [facility], [place] [address], [place], [place] [address]   Surgical pcr screen     Status: Abnormal   Collection Time: 05/12/20  3:27 PM   Specimen: Nasal Mucosa; Nasal Swab  Result Value Ref Range Status   MRSA, PCR NEGATIVE NEGATIVE Final   Staphylococcus aureus POSITIVE (A) NEGATIVE Final    Comment: (NOTE) The Xpert SA Assay (FDA approved for NASAL specimens in patients 14 years of age and older), is one component of a comprehensive surveillance program. It is not intended to diagnose infection nor to guide or monitor treatment. Performed at [facility], [place] [address] [place], [place] [address]   SARS CORONAVIRUS 2 (TAT 6-24 HRS) Nasopharyngeal Nasopharyngeal Swab     Status: None   Collection Time: 05/18/20  1:45 PM   Specimen: Nasopharyngeal Swab  Result Value Ref Range Status   SARS Coronavirus 2 NEGATIVE NEGATIVE Final    Comment: (NOTE) SARS-CoV-2 target nucleic acids are NOT DETECTED.  The SARS-CoV-2 RNA is generally detectable in upper and lower respiratory specimens during the acute phase of infection. Negative results do not preclude SARS-CoV-2 infection, do not rule out co-infections with other pathogens, and should not be used as the sole basis for treatment or other patient management decisions. Negative results must be combined with clinical observations, patient history, and epidemiological information. The expected result is Negative.  Fact Sheet for Patients: SugarRoll.be  Fact Sheet for Healthcare Providers: https://www.woods-mathews.com/  This test is not yet approved or cleared by the [place] FDA and  has been authorized for detection and/or diagnosis of SARS-CoV-2 by FDA under an Emergency Use Authorization (EUA). This  EUA will remain  in effect (meaning this test can be used) for the duration of the COVID-19 declaration under Se ction 564(b)(1) of the Act, 21 U.S.C. section 360bbb-3(b)(1), unless the authorization is terminated or revoked sooner.  Performed at [facility], [place] [address], [place], [place] [address]   Surgical pcr screen     Status: None   Collection Time: 05/21/20  2:00 AM   Specimen: Nasal Mucosa; Nasal Swab  Result Value Ref Range Status   MRSA, PCR NEGATIVE NEGATIVE Final   Staphylococcus aureus NEGATIVE NEGATIVE Final    Comment: (NOTE) The Xpert SA Assay (FDA approved for NASAL specimens in patients 52 years of age and older), is one component of a comprehensive surveillance program.  It is not intended to diagnose infection nor to guide or monitor treatment. Performed at [facility], [place] [address] [place], [place] [address]      Studies: CT ANGIO CHEST PE W OR WO CONTRAST  Result Date: 05/20/2020 CLINICAL DATA:  70 year old female with recent positive V/Q scan for pulmonary embolism. EXAM: CT ANGIOGRAPHY CHEST WITH CONTRAST TECHNIQUE: Multidetector CT imaging of the chest was performed using the standard protocol during bolus administration of intravenous contrast. Multiplanar CT image reconstructions and MIPs were obtained to evaluate the vascular anatomy. CONTRAST:  Fifty mL Omnipaque 350, intravenous COMPARISON:  05/19/2020, 10/09/2018 FINDINGS: Cardiovascular: Satisfactory opacification of the pulmonary arteries to the segmental level. Acute bilateral pulmonary emboli extending from the distal main bilateral pulmonary arteries to the upper and lower subsegmental branches, mostly nonocclusive throughout. The right to left ventricular ratio is approximately 1.4. There is mild prominence of the main pulmonary artery measuring up to 31 mm. There is reflux of contrast into the perihepatic IVC and central hepatic veins. No pericardial effusion. Mediastinum/Nodes: No  enlarged mediastinal, hilar, or axillary lymph nodes. Thyroid gland, trachea, and esophagus demonstrate no significant findings. Lungs/Pleura: Diffuse mosaic attenuation pattern. Scattered interlobular septal thickening. Mild bibasilar subsegmental atelectasis. No evidence of pulmonary infarction. No significant pleural effusion or evidence of pneumothorax. No suspicious pulmonary nodules. Upper Abdomen: Diffusely decreased attenuation of the hepatic parenchyma. The remaining visualized upper abdomen is otherwise within normal limits. Musculoskeletal: Mild multilevel degenerative changes of the thoracic spine. No acute osseous abnormality or aggressive appearing osseous lesion. Review of the MIP images confirms the above findings. IMPRESSION: Vascular: Acute bilateral pulmonary emboli, symmetrically extending from the distal main pulmonary arteries to the subsegmental branches in the upper and lower lobes. There is CT evidence of right heart strain. Non-Vascular: 1. Mild pulmonary edema.  No evidence of pulmonary infarction. 2. Hepatic steatosis. These results were relayed via Epic secure chat at the time of interpretation on 05/20/2020 at 4:22 pm to provider [facility], who acknowledged these results. Ruthann Cancer, MD [facility] Electronically Signed   By: Ruthann Cancer MD   On: 05/20/2020 16:25   NM Pulmonary Perfusion  Result Date: 05/19/2020 CLINICAL DATA:  Shortness of breath and positive D-dimer EXAM: NUCLEAR MEDICINE PERFUSION LUNG SCAN TECHNIQUE: Perfusion images were obtained in multiple projections after intravenous injection of radiopharmaceutical. Ventilation scans intentionally deferred if perfusion scan and chest x-ray adequate for interpretation during COVID 19 epidemic. RADIOPHARMACEUTICALS:  Four mCi Tc-83m MAA IV COMPARISON:  Chest from earlier in the same day. FINDINGS: Multiple wedge like defects are identified bilaterally as well  as near absence of perfusion in the right lung base. These changes would be consistent with multifocal pulmonary emboli. IMPRESSION: Multiple defects bilaterally with a normal appearing chest x-ray consistent with pulmonary embolism. Electronically Signed   By: Inez Catalina M.D.   On: 05/19/2020 15:52   IR Angiogram Pulmonary Bilateral Selective  Result Date: 05/21/2020 INDICATION: 70 year old female 5 days status post L4 laminectomy and decompressive spinal surgery now with high intermediate risk acute PE with right heart strain. Given recent surgical intervention on the lumbar spine, she is a poor candidate for catheter directed thrombolysis. Therefore, we plan to proceed with catheter directed mechanical thrombectomy. EXAM: 1. Ultrasound-guided puncture right common femoral vein 2. Right iliac and inferior vena cavagram 3. Catheterization of the main pulmonary artery with pulmonary arteriogram and pulmonary artery pressure measurements 4. Catheterization of the left lower lobe pulmonary artery with  arteriogram 5. Mechanical thrombectomy with extirpation of clot from the left main and left lower lobe pulmonary arteries 6. Catheterization of the right pulmonary artery with arteriogram 7. Mechanical thrombectomy with extirpation of clot from the right lower lobe and right main pulmonary arteries COMPARISON:  CTA chest 05/20/2020 MEDICATIONS: None. ANESTHESIA/SEDATION: Deep sedation and hemodynamic support provided by anesthesiology. FLUOROSCOPY TIME:  Fluoroscopy Time: 25 minutes 24 seconds (187 mGy). COMPLICATIONS: None immediate. TECHNIQUE: Informed written consent was obtained from the patient after a thorough discussion of the procedural risks, benefits and alternatives. All questions were addressed. Maximal Sterile Barrier Technique was utilized including caps, mask, sterile gowns, sterile gloves, sterile drape, hand hygiene and skin antiseptic. A timeout was performed prior to the initiation of the  procedure. The right common femoral vein was interrogated with ultrasound and found to be widely patent. An image was obtained and stored for the medical record. Local anesthesia was attained by infiltration with 1% lidocaine. A small dermatotomy was made. Under real-time sonographic guidance, the vessel was punctured with a 21 gauge micropuncture needle. Using standard technique, the initial micro needle was exchanged over a 0.018 micro wire for a transitional 4 [place] micro sheath. The micro sheath was then exchanged over a 0.035 wire for a 14 French fascial dilator and the soft tissue tract was dilated. Soft tissue tract was then serially dilated to 20 [place]. A 24 French dry seal sheath was then advanced over the wire and positioned in the inferior vena cava. A 6 French angled pigtail catheter was then advanced through the tricuspid valve and up into the main pulmonary outflow tract. An initial pulmonary arteriogram was performed confirming the catheters position in the main pulmonary artery. As the injection was performed, the catheter flipped into the left main pulmonary artery. Extensive bilateral pulmonary emboli are visualized. Pressures were then obtained. Main PA pressure 50/16 (29) mm Hg The pigtail catheter was then exchanged over an Amplatz wire for a 100 cm angled catheter. The catheter and wire combination were then advanced into the left lower lobe pulmonary artery. The 24 [place] FlowTriever thrombectomy catheter was then advanced over the wire and into the left main pulmonary artery. Additional arteriography was performed demonstrating large volume thrombus within the left main, left upper and left lower lobe pulmonary arteries. The 33 French pigtail thrombectomy catheter was then advanced coaxially over the wire and into the left lower lobe pulmonary artery. Mechanical thrombectomy was then performed throughout the left lower and left main pulmonary artery. Extensive mixed acute and chronic  thrombus was successfully extra patent from the pulmonary arterial tree. Follow-up left pulmonary arteriography demonstrates near complete removal of thrombus with excellent opacification of the pulmonary arterial tree and broad parenchymal opacification. Minimal wedge-shaped defect persists peripherally. The 20 French thrombectomy catheter was removed. The 24 French catheter was brought back into the main pulmonary outflow tract. The angled catheter was reintroduced coaxially and used to select the right main pulmonary artery over a Bentson wire. Right sided pulmonary arteriography was performed. Extensive clot burden in the right main pulmonary artery extending into the truncus anterior and right lower lobe pulmonary arteries. The superstiff Amplatz wire was successfully advanced into the right lower lobe pulmonary artery. The 24 French thrombectomy catheter was advanced over the wire. Suction thrombectomy was again performed. Successful extirpation of extensive thrombus from the right lower lobe pulmonary artery and truncus anterior branch artery. Follow-up pulmonary arteriography demonstrates significant debulking of thrombus. There is a small amount of residual thrombus in  the truncus anterior and right middle lobe pulmonary artery. Markedly improved parenchymal opacification in the right upper and lower lobes. Persistently decreased opacification in the right middle lobe pulmonary artery. At this time, patient remained hemodynamically stable. Main pulmonary artery pressure was again checked and found to be significantly improved at 48/10 (23) mm Hg. This appears to be a good clinical endpoint. Therefore, the procedure was terminated and the catheters and sheath removed. Hemostasis was attained with the assistance of an 0 silk pursestring suture at the right common femoral entry site. FINDINGS: Initial main pulmonary arterial pressure: 50/16 (29) mm Hg Post thrombectomy main pulmonary arterial pressure: 40/10  (23) mm Hg IMPRESSION: 1. Large volume bilateral pulmonary emboli with secondary pulmonary arterial hypertension. 2. Successful extirpation of greater than 90% of the left-sided thrombus, and approximately 70% of the right-sided thrombus using mechanical thrombectomy. Electronically Signed   By: Jacqulynn Cadet M.D.   On: 05/21/2020 14:18   IR Angiogram Selective Each Additional Vessel  Result Date: 05/21/2020 INDICATION: 70 year old female 5 days status post L4 laminectomy and decompressive spinal surgery now with high intermediate risk acute PE with right heart strain. Given recent surgical intervention on the lumbar spine, she is a poor candidate for catheter directed thrombolysis. Therefore, we plan to proceed with catheter directed mechanical thrombectomy. EXAM: 1. Ultrasound-guided puncture right common femoral vein 2. Right iliac and inferior vena cavagram 3. Catheterization of the main pulmonary artery with pulmonary arteriogram and pulmonary artery pressure measurements 4. Catheterization of the left lower lobe pulmonary artery with arteriogram 5. Mechanical thrombectomy with extirpation of clot from the left main and left lower lobe pulmonary arteries 6. Catheterization of the right pulmonary artery with arteriogram 7. Mechanical thrombectomy with extirpation of clot from the right lower lobe and right main pulmonary arteries COMPARISON:  CTA chest 05/20/2020 MEDICATIONS: None. ANESTHESIA/SEDATION: Deep sedation and hemodynamic support provided by anesthesiology. FLUOROSCOPY TIME:  Fluoroscopy Time: 25 minutes 24 seconds (187 mGy). COMPLICATIONS: None immediate. TECHNIQUE: Informed written consent was obtained from the patient after a thorough discussion of the procedural risks, benefits and alternatives. All questions were addressed. Maximal Sterile Barrier Technique was utilized including caps, mask, sterile gowns, sterile gloves, sterile drape, hand hygiene and skin antiseptic. A timeout was  performed prior to the initiation of the procedure. The right common femoral vein was interrogated with ultrasound and found to be widely patent. An image was obtained and stored for the medical record. Local anesthesia was attained by infiltration with 1% lidocaine. A small dermatotomy was made. Under real-time sonographic guidance, the vessel was punctured with a 21 gauge micropuncture needle. Using standard technique, the initial micro needle was exchanged over a 0.018 micro wire for a transitional 4 [place] micro sheath. The micro sheath was then exchanged over a 0.035 wire for a 14 French fascial dilator and the soft tissue tract was dilated. Soft tissue tract was then serially dilated to 20 [place]. A 24 French dry seal sheath was then advanced over the wire and positioned in the inferior vena cava. A 6 French angled pigtail catheter was then advanced through the tricuspid valve and up into the main pulmonary outflow tract. An initial pulmonary arteriogram was performed confirming the catheters position in the main pulmonary artery. As the injection was performed, the catheter flipped into the left main pulmonary artery. Extensive bilateral pulmonary emboli are visualized. Pressures were then obtained. Main PA pressure 50/16 (29) mm Hg The pigtail catheter was then exchanged over an Amplatz wire for a 100  cm angled catheter. The catheter and wire combination were then advanced into the left lower lobe pulmonary artery. The 24 [place] FlowTriever thrombectomy catheter was then advanced over the wire and into the left main pulmonary artery. Additional arteriography was performed demonstrating large volume thrombus within the left main, left upper and left lower lobe pulmonary arteries. The 12 French pigtail thrombectomy catheter was then advanced coaxially over the wire and into the left lower lobe pulmonary artery. Mechanical thrombectomy was then performed throughout the left lower and left main pulmonary  artery. Extensive mixed acute and chronic thrombus was successfully extra patent from the pulmonary arterial tree. Follow-up left pulmonary arteriography demonstrates near complete removal of thrombus with excellent opacification of the pulmonary arterial tree and broad parenchymal opacification. Minimal wedge-shaped defect persists peripherally. The 20 French thrombectomy catheter was removed. The 24 French catheter was brought back into the main pulmonary outflow tract. The angled catheter was reintroduced coaxially and used to select the right main pulmonary artery over a Bentson wire. Right sided pulmonary arteriography was performed. Extensive clot burden in the right main pulmonary artery extending into the truncus anterior and right lower lobe pulmonary arteries. The superstiff Amplatz wire was successfully advanced into the right lower lobe pulmonary artery. The 24 French thrombectomy catheter was advanced over the wire. Suction thrombectomy was again performed. Successful extirpation of extensive thrombus from the right lower lobe pulmonary artery and truncus anterior branch artery. Follow-up pulmonary arteriography demonstrates significant debulking of thrombus. There is a small amount of residual thrombus in the truncus anterior and right middle lobe pulmonary artery. Markedly improved parenchymal opacification in the right upper and lower lobes. Persistently decreased opacification in the right middle lobe pulmonary artery. At this time, patient remained hemodynamically stable. Main pulmonary artery pressure was again checked and found to be significantly improved at 48/10 (23) mm Hg. This appears to be a good clinical endpoint. Therefore, the procedure was terminated and the catheters and sheath removed. Hemostasis was attained with the assistance of an 0 silk pursestring suture at the right common femoral entry site. FINDINGS: Initial main pulmonary arterial pressure: 50/16 (29) mm Hg Post thrombectomy  main pulmonary arterial pressure: 40/10 (23) mm Hg IMPRESSION: 1. Large volume bilateral pulmonary emboli with secondary pulmonary arterial hypertension. 2. Successful extirpation of greater than 90% of the left-sided thrombus, and approximately 70% of the right-sided thrombus using mechanical thrombectomy. Electronically Signed   By: Jacqulynn Cadet M.D.   On: 05/21/2020 14:18   IR Angiogram Selective Each Additional Vessel  Result Date: 05/21/2020 INDICATION: 70 year old female 5 days status post L4 laminectomy and decompressive spinal surgery now with high intermediate risk acute PE with right heart strain. Given recent surgical intervention on the lumbar spine, she is a poor candidate for catheter directed thrombolysis. Therefore, we plan to proceed with catheter directed mechanical thrombectomy. EXAM: 1. Ultrasound-guided puncture right common femoral vein 2. Right iliac and inferior vena cavagram 3. Catheterization of the main pulmonary artery with pulmonary arteriogram and pulmonary artery pressure measurements 4. Catheterization of the left lower lobe pulmonary artery with arteriogram 5. Mechanical thrombectomy with extirpation of clot from the left main and left lower lobe pulmonary arteries 6. Catheterization of the right pulmonary artery with arteriogram 7. Mechanical thrombectomy with extirpation of clot from the right lower lobe and right main pulmonary arteries COMPARISON:  CTA chest 05/20/2020 MEDICATIONS: None. ANESTHESIA/SEDATION: Deep sedation and hemodynamic support provided by anesthesiology. FLUOROSCOPY TIME:  Fluoroscopy Time: 25 minutes 24 seconds (187 mGy). COMPLICATIONS:  None immediate. TECHNIQUE: Informed written consent was obtained from the patient after a thorough discussion of the procedural risks, benefits and alternatives. All questions were addressed. Maximal Sterile Barrier Technique was utilized including caps, mask, sterile gowns, sterile gloves, sterile drape, hand hygiene  and skin antiseptic. A timeout was performed prior to the initiation of the procedure. The right common femoral vein was interrogated with ultrasound and found to be widely patent. An image was obtained and stored for the medical record. Local anesthesia was attained by infiltration with 1% lidocaine. A small dermatotomy was made. Under real-time sonographic guidance, the vessel was punctured with a 21 gauge micropuncture needle. Using standard technique, the initial micro needle was exchanged over a 0.018 micro wire for a transitional 4 [place] micro sheath. The micro sheath was then exchanged over a 0.035 wire for a 14 French fascial dilator and the soft tissue tract was dilated. Soft tissue tract was then serially dilated to 20 [place]. A 24 French dry seal sheath was then advanced over the wire and positioned in the inferior vena cava. A 6 French angled pigtail catheter was then advanced through the tricuspid valve and up into the main pulmonary outflow tract. An initial pulmonary arteriogram was performed confirming the catheters position in the main pulmonary artery. As the injection was performed, the catheter flipped into the left main pulmonary artery. Extensive bilateral pulmonary emboli are visualized. Pressures were then obtained. Main PA pressure 50/16 (29) mm Hg The pigtail catheter was then exchanged over an Amplatz wire for a 100 cm angled catheter. The catheter and wire combination were then advanced into the left lower lobe pulmonary artery. The 24 [place] FlowTriever thrombectomy catheter was then advanced over the wire and into the left main pulmonary artery. Additional arteriography was performed demonstrating large volume thrombus within the left main, left upper and left lower lobe pulmonary arteries. The 74 French pigtail thrombectomy catheter was then advanced coaxially over the wire and into the left lower lobe pulmonary artery. Mechanical thrombectomy was then performed throughout the  left lower and left main pulmonary artery. Extensive mixed acute and chronic thrombus was successfully extra patent from the pulmonary arterial tree. Follow-up left pulmonary arteriography demonstrates near complete removal of thrombus with excellent opacification of the pulmonary arterial tree and broad parenchymal opacification. Minimal wedge-shaped defect persists peripherally. The 20 French thrombectomy catheter was removed. The 24 French catheter was brought back into the main pulmonary outflow tract. The angled catheter was reintroduced coaxially and used to select the right main pulmonary artery over a Bentson wire. Right sided pulmonary arteriography was performed. Extensive clot burden in the right main pulmonary artery extending into the truncus anterior and right lower lobe pulmonary arteries. The superstiff Amplatz wire was successfully advanced into the right lower lobe pulmonary artery. The 24 French thrombectomy catheter was advanced over the wire. Suction thrombectomy was again performed. Successful extirpation of extensive thrombus from the right lower lobe pulmonary artery and truncus anterior branch artery. Follow-up pulmonary arteriography demonstrates significant debulking of thrombus. There is a small amount of residual thrombus in the truncus anterior and right middle lobe pulmonary artery. Markedly improved parenchymal opacification in the right upper and lower lobes. Persistently decreased opacification in the right middle lobe pulmonary artery. At this time, patient remained hemodynamically stable. Main pulmonary artery pressure was again checked and found to be significantly improved at 48/10 (23) mm Hg. This appears to be a good clinical endpoint. Therefore, the procedure was terminated and the catheters and  sheath removed. Hemostasis was attained with the assistance of an 0 silk pursestring suture at the right common femoral entry site. FINDINGS: Initial main pulmonary arterial pressure:  50/16 (29) mm Hg Post thrombectomy main pulmonary arterial pressure: 40/10 (23) mm Hg IMPRESSION: 1. Large volume bilateral pulmonary emboli with secondary pulmonary arterial hypertension. 2. Successful extirpation of greater than 90% of the left-sided thrombus, and approximately 70% of the right-sided thrombus using mechanical thrombectomy. Electronically Signed   By: Jacqulynn Cadet M.D.   On: 05/21/2020 14:18   IR Venocavagram Svc  Result Date: 05/21/2020 INDICATION: 70 year old female 5 days status post L4 laminectomy and decompressive spinal surgery now with high intermediate risk acute PE with right heart strain. Given recent surgical intervention on the lumbar spine, she is a poor candidate for catheter directed thrombolysis. Therefore, we plan to proceed with catheter directed mechanical thrombectomy. EXAM: 1. Ultrasound-guided puncture right common femoral vein 2. Right iliac and inferior vena cavagram 3. Catheterization of the main pulmonary artery with pulmonary arteriogram and pulmonary artery pressure measurements 4. Catheterization of the left lower lobe pulmonary artery with arteriogram 5. Mechanical thrombectomy with extirpation of clot from the left main and left lower lobe pulmonary arteries 6. Catheterization of the right pulmonary artery with arteriogram 7. Mechanical thrombectomy with extirpation of clot from the right lower lobe and right main pulmonary arteries COMPARISON:  CTA chest 05/20/2020 MEDICATIONS: None. ANESTHESIA/SEDATION: Deep sedation and hemodynamic support provided by anesthesiology. FLUOROSCOPY TIME:  Fluoroscopy Time: 25 minutes 24 seconds (187 mGy). COMPLICATIONS: None immediate. TECHNIQUE: Informed written consent was obtained from the patient after a thorough discussion of the procedural risks, benefits and alternatives. All questions were addressed. Maximal Sterile Barrier Technique was utilized including caps, mask, sterile gowns, sterile gloves, sterile drape, hand  hygiene and skin antiseptic. A timeout was performed prior to the initiation of the procedure. The right common femoral vein was interrogated with ultrasound and found to be widely patent. An image was obtained and stored for the medical record. Local anesthesia was attained by infiltration with 1% lidocaine. A small dermatotomy was made. Under real-time sonographic guidance, the vessel was punctured with a 21 gauge micropuncture needle. Using standard technique, the initial micro needle was exchanged over a 0.018 micro wire for a transitional 4 [place] micro sheath. The micro sheath was then exchanged over a 0.035 wire for a 14 French fascial dilator and the soft tissue tract was dilated. Soft tissue tract was then serially dilated to 20 [place]. A 24 French dry seal sheath was then advanced over the wire and positioned in the inferior vena cava. A 6 French angled pigtail catheter was then advanced through the tricuspid valve and up into the main pulmonary outflow tract. An initial pulmonary arteriogram was performed confirming the catheters position in the main pulmonary artery. As the injection was performed, the catheter flipped into the left main pulmonary artery. Extensive bilateral pulmonary emboli are visualized. Pressures were then obtained. Main PA pressure 50/16 (29) mm Hg The pigtail catheter was then exchanged over an Amplatz wire for a 100 cm angled catheter. The catheter and wire combination were then advanced into the left lower lobe pulmonary artery. The 24 [place] FlowTriever thrombectomy catheter was then advanced over the wire and into the left main pulmonary artery. Additional arteriography was performed demonstrating large volume thrombus within the left main, left upper and left lower lobe pulmonary arteries. The 54 French pigtail thrombectomy catheter was then advanced coaxially over the wire and into the left  lower lobe pulmonary artery. Mechanical thrombectomy was then performed  throughout the left lower and left main pulmonary artery. Extensive mixed acute and chronic thrombus was successfully extra patent from the pulmonary arterial tree. Follow-up left pulmonary arteriography demonstrates near complete removal of thrombus with excellent opacification of the pulmonary arterial tree and broad parenchymal opacification. Minimal wedge-shaped defect persists peripherally. The 20 French thrombectomy catheter was removed. The 24 French catheter was brought back into the main pulmonary outflow tract. The angled catheter was reintroduced coaxially and used to select the right main pulmonary artery over a Bentson wire. Right sided pulmonary arteriography was performed. Extensive clot burden in the right main pulmonary artery extending into the truncus anterior and right lower lobe pulmonary arteries. The superstiff Amplatz wire was successfully advanced into the right lower lobe pulmonary artery. The 24 French thrombectomy catheter was advanced over the wire. Suction thrombectomy was again performed. Successful extirpation of extensive thrombus from the right lower lobe pulmonary artery and truncus anterior branch artery. Follow-up pulmonary arteriography demonstrates significant debulking of thrombus. There is a small amount of residual thrombus in the truncus anterior and right middle lobe pulmonary artery. Markedly improved parenchymal opacification in the right upper and lower lobes. Persistently decreased opacification in the right middle lobe pulmonary artery. At this time, patient remained hemodynamically stable. Main pulmonary artery pressure was again checked and found to be significantly improved at 48/10 (23) mm Hg. This appears to be a good clinical endpoint. Therefore, the procedure was terminated and the catheters and sheath removed. Hemostasis was attained with the assistance of an 0 silk pursestring suture at the right common femoral entry site. FINDINGS: Initial main pulmonary  arterial pressure: 50/16 (29) mm Hg Post thrombectomy main pulmonary arterial pressure: 40/10 (23) mm Hg IMPRESSION: 1. Large volume bilateral pulmonary emboli with secondary pulmonary arterial hypertension. 2. Successful extirpation of greater than 90% of the left-sided thrombus, and approximately 70% of the right-sided thrombus using mechanical thrombectomy. Electronically Signed   By: Jacqulynn Cadet M.D.   On: 05/21/2020 14:18   IR THROMBECT PRIM MECH INIT (INCLU) MOD SED  Result Date: 05/21/2020 INDICATION: 70 year old female 5 days status post L4 laminectomy and decompressive spinal surgery now with high intermediate risk acute PE with right heart strain. Given recent surgical intervention on the lumbar spine, she is a poor candidate for catheter directed thrombolysis. Therefore, we plan to proceed with catheter directed mechanical thrombectomy. EXAM: 1. Ultrasound-guided puncture right common femoral vein 2. Right iliac and inferior vena cavagram 3. Catheterization of the main pulmonary artery with pulmonary arteriogram and pulmonary artery pressure measurements 4. Catheterization of the left lower lobe pulmonary artery with arteriogram 5. Mechanical thrombectomy with extirpation of clot from the left main and left lower lobe pulmonary arteries 6. Catheterization of the right pulmonary artery with arteriogram 7. Mechanical thrombectomy with extirpation of clot from the right lower lobe and right main pulmonary arteries COMPARISON:  CTA chest 05/20/2020 MEDICATIONS: None. ANESTHESIA/SEDATION: Deep sedation and hemodynamic support provided by anesthesiology. FLUOROSCOPY TIME:  Fluoroscopy Time: 25 minutes 24 seconds (187 mGy). COMPLICATIONS: None immediate. TECHNIQUE: Informed written consent was obtained from the patient after a thorough discussion of the procedural risks, benefits and alternatives. All questions were addressed. Maximal Sterile Barrier Technique was utilized including caps, mask, sterile  gowns, sterile gloves, sterile drape, hand hygiene and skin antiseptic. A timeout was performed prior to the initiation of the procedure. The right common femoral vein was interrogated with ultrasound and found to be widely patent.  An image was obtained and stored for the medical record. Local anesthesia was attained by infiltration with 1% lidocaine. A small dermatotomy was made. Under real-time sonographic guidance, the vessel was punctured with a 21 gauge micropuncture needle. Using standard technique, the initial micro needle was exchanged over a 0.018 micro wire for a transitional 4 [place] micro sheath. The micro sheath was then exchanged over a 0.035 wire for a 14 French fascial dilator and the soft tissue tract was dilated. Soft tissue tract was then serially dilated to 20 [place]. A 24 French dry seal sheath was then advanced over the wire and positioned in the inferior vena cava. A 6 French angled pigtail catheter was then advanced through the tricuspid valve and up into the main pulmonary outflow tract. An initial pulmonary arteriogram was performed confirming the catheters position in the main pulmonary artery. As the injection was performed, the catheter flipped into the left main pulmonary artery. Extensive bilateral pulmonary emboli are visualized. Pressures were then obtained. Main PA pressure 50/16 (29) mm Hg The pigtail catheter was then exchanged over an Amplatz wire for a 100 cm angled catheter. The catheter and wire combination were then advanced into the left lower lobe pulmonary artery. The 24 [place] FlowTriever thrombectomy catheter was then advanced over the wire and into the left main pulmonary artery. Additional arteriography was performed demonstrating large volume thrombus within the left main, left upper and left lower lobe pulmonary arteries. The 100 French pigtail thrombectomy catheter was then advanced coaxially over the wire and into the left lower lobe pulmonary artery.  Mechanical thrombectomy was then performed throughout the left lower and left main pulmonary artery. Extensive mixed acute and chronic thrombus was successfully extra patent from the pulmonary arterial tree. Follow-up left pulmonary arteriography demonstrates near complete removal of thrombus with excellent opacification of the pulmonary arterial tree and broad parenchymal opacification. Minimal wedge-shaped defect persists peripherally. The 20 French thrombectomy catheter was removed. The 24 French catheter was brought back into the main pulmonary outflow tract. The angled catheter was reintroduced coaxially and used to select the right main pulmonary artery over a Bentson wire. Right sided pulmonary arteriography was performed. Extensive clot burden in the right main pulmonary artery extending into the truncus anterior and right lower lobe pulmonary arteries. The superstiff Amplatz wire was successfully advanced into the right lower lobe pulmonary artery. The 24 French thrombectomy catheter was advanced over the wire. Suction thrombectomy was again performed. Successful extirpation of extensive thrombus from the right lower lobe pulmonary artery and truncus anterior branch artery. Follow-up pulmonary arteriography demonstrates significant debulking of thrombus. There is a small amount of residual thrombus in the truncus anterior and right middle lobe pulmonary artery. Markedly improved parenchymal opacification in the right upper and lower lobes. Persistently decreased opacification in the right middle lobe pulmonary artery. At this time, patient remained hemodynamically stable. Main pulmonary artery pressure was again checked and found to be significantly improved at 48/10 (23) mm Hg. This appears to be a good clinical endpoint. Therefore, the procedure was terminated and the catheters and sheath removed. Hemostasis was attained with the assistance of an 0 silk pursestring suture at the right common femoral entry  site. FINDINGS: Initial main pulmonary arterial pressure: 50/16 (29) mm Hg Post thrombectomy main pulmonary arterial pressure: 40/10 (23) mm Hg IMPRESSION: 1. Large volume bilateral pulmonary emboli with secondary pulmonary arterial hypertension. 2. Successful extirpation of greater than 90% of the left-sided thrombus, and approximately 70% of the right-sided thrombus using  mechanical thrombectomy. Electronically Signed   By: Jacqulynn Cadet M.D.   On: 05/21/2020 14:18   IR THROMBECT PRIM MECH INIT (INCLU) MOD SED  Result Date: 05/21/2020 INDICATION: 70 year old female 5 days status post L4 laminectomy and decompressive spinal surgery now with high intermediate risk acute PE with right heart strain. Given recent surgical intervention on the lumbar spine, she is a poor candidate for catheter directed thrombolysis. Therefore, we plan to proceed with catheter directed mechanical thrombectomy. EXAM: 1. Ultrasound-guided puncture right common femoral vein 2. Right iliac and inferior vena cavagram 3. Catheterization of the main pulmonary artery with pulmonary arteriogram and pulmonary artery pressure measurements 4. Catheterization of the left lower lobe pulmonary artery with arteriogram 5. Mechanical thrombectomy with extirpation of clot from the left main and left lower lobe pulmonary arteries 6. Catheterization of the right pulmonary artery with arteriogram 7. Mechanical thrombectomy with extirpation of clot from the right lower lobe and right main pulmonary arteries COMPARISON:  CTA chest 05/20/2020 MEDICATIONS: None. ANESTHESIA/SEDATION: Deep sedation and hemodynamic support provided by anesthesiology. FLUOROSCOPY TIME:  Fluoroscopy Time: 25 minutes 24 seconds (187 mGy). COMPLICATIONS: None immediate. TECHNIQUE: Informed written consent was obtained from the patient after a thorough discussion of the procedural risks, benefits and alternatives. All questions were addressed. Maximal Sterile Barrier Technique was  utilized including caps, mask, sterile gowns, sterile gloves, sterile drape, hand hygiene and skin antiseptic. A timeout was performed prior to the initiation of the procedure. The right common femoral vein was interrogated with ultrasound and found to be widely patent. An image was obtained and stored for the medical record. Local anesthesia was attained by infiltration with 1% lidocaine. A small dermatotomy was made. Under real-time sonographic guidance, the vessel was punctured with a 21 gauge micropuncture needle. Using standard technique, the initial micro needle was exchanged over a 0.018 micro wire for a transitional 4 [place] micro sheath. The micro sheath was then exchanged over a 0.035 wire for a 14 French fascial dilator and the soft tissue tract was dilated. Soft tissue tract was then serially dilated to 20 [place]. A 24 French dry seal sheath was then advanced over the wire and positioned in the inferior vena cava. A 6 French angled pigtail catheter was then advanced through the tricuspid valve and up into the main pulmonary outflow tract. An initial pulmonary arteriogram was performed confirming the catheters position in the main pulmonary artery. As the injection was performed, the catheter flipped into the left main pulmonary artery. Extensive bilateral pulmonary emboli are visualized. Pressures were then obtained. Main PA pressure 50/16 (29) mm Hg The pigtail catheter was then exchanged over an Amplatz wire for a 100 cm angled catheter. The catheter and wire combination were then advanced into the left lower lobe pulmonary artery. The 24 [place] FlowTriever thrombectomy catheter was then advanced over the wire and into the left main pulmonary artery. Additional arteriography was performed demonstrating large volume thrombus within the left main, left upper and left lower lobe pulmonary arteries. The 35 French pigtail thrombectomy catheter was then advanced coaxially over the wire and into the  left lower lobe pulmonary artery. Mechanical thrombectomy was then performed throughout the left lower and left main pulmonary artery. Extensive mixed acute and chronic thrombus was successfully extra patent from the pulmonary arterial tree. Follow-up left pulmonary arteriography demonstrates near complete removal of thrombus with excellent opacification of the pulmonary arterial tree and broad parenchymal opacification. Minimal wedge-shaped defect persists peripherally. The 20 French thrombectomy catheter was removed.  The 24 French catheter was brought back into the main pulmonary outflow tract. The angled catheter was reintroduced coaxially and used to select the right main pulmonary artery over a Bentson wire. Right sided pulmonary arteriography was performed. Extensive clot burden in the right main pulmonary artery extending into the truncus anterior and right lower lobe pulmonary arteries. The superstiff Amplatz wire was successfully advanced into the right lower lobe pulmonary artery. The 24 French thrombectomy catheter was advanced over the wire. Suction thrombectomy was again performed. Successful extirpation of extensive thrombus from the right lower lobe pulmonary artery and truncus anterior branch artery. Follow-up pulmonary arteriography demonstrates significant debulking of thrombus. There is a small amount of residual thrombus in the truncus anterior and right middle lobe pulmonary artery. Markedly improved parenchymal opacification in the right upper and lower lobes. Persistently decreased opacification in the right middle lobe pulmonary artery. At this time, patient remained hemodynamically stable. Main pulmonary artery pressure was again checked and found to be significantly improved at 48/10 (23) mm Hg. This appears to be a good clinical endpoint. Therefore, the procedure was terminated and the catheters and sheath removed. Hemostasis was attained with the assistance of an 0 silk pursestring suture  at the right common femoral entry site. FINDINGS: Initial main pulmonary arterial pressure: 50/16 (29) mm Hg Post thrombectomy main pulmonary arterial pressure: 40/10 (23) mm Hg IMPRESSION: 1. Large volume bilateral pulmonary emboli with secondary pulmonary arterial hypertension. 2. Successful extirpation of greater than 90% of the left-sided thrombus, and approximately 70% of the right-sided thrombus using mechanical thrombectomy. Electronically Signed   By: Jacqulynn Cadet M.D.   On: 05/21/2020 14:18   IR US Guide Vasc Access Right  Result Date: 05/21/2020 INDICATION: 70 year old female 5 days status post L4 laminectomy and decompressive spinal surgery now with high intermediate risk acute PE with right heart strain. Given recent surgical intervention on the lumbar spine, she is a poor candidate for catheter directed thrombolysis. Therefore, we plan to proceed with catheter directed mechanical thrombectomy. EXAM: 1. Ultrasound-guided puncture right common femoral vein 2. Right iliac and inferior vena cavagram 3. Catheterization of the main pulmonary artery with pulmonary arteriogram and pulmonary artery pressure measurements 4. Catheterization of the left lower lobe pulmonary artery with arteriogram 5. Mechanical thrombectomy with extirpation of clot from the left main and left lower lobe pulmonary arteries 6. Catheterization of the right pulmonary artery with arteriogram 7. Mechanical thrombectomy with extirpation of clot from the right lower lobe and right main pulmonary arteries COMPARISON:  CTA chest 05/20/2020 MEDICATIONS: None. ANESTHESIA/SEDATION: Deep sedation and hemodynamic support provided by anesthesiology. FLUOROSCOPY TIME:  Fluoroscopy Time: 25 minutes 24 seconds (187 mGy). COMPLICATIONS: None immediate. TECHNIQUE: Informed written consent was obtained from the patient after a thorough discussion of the procedural risks, benefits and alternatives. All questions were addressed. Maximal Sterile  Barrier Technique was utilized including caps, mask, sterile gowns, sterile gloves, sterile drape, hand hygiene and skin antiseptic. A timeout was performed prior to the initiation of the procedure. The right common femoral vein was interrogated with ultrasound and found to be widely patent. An image was obtained and stored for the medical record. Local anesthesia was attained by infiltration with 1% lidocaine. A small dermatotomy was made. Under real-time sonographic guidance, the vessel was punctured with a 21 gauge micropuncture needle. Using standard technique, the initial micro needle was exchanged over a 0.018 micro wire for a transitional 4 [place] micro sheath. The micro sheath was then exchanged over a 0.035 wire for  a 56 French fascial dilator and the soft tissue tract was dilated. Soft tissue tract was then serially dilated to 20 [place]. A 24 French dry seal sheath was then advanced over the wire and positioned in the inferior vena cava. A 6 French angled pigtail catheter was then advanced through the tricuspid valve and up into the main pulmonary outflow tract. An initial pulmonary arteriogram was performed confirming the catheters position in the main pulmonary artery. As the injection was performed, the catheter flipped into the left main pulmonary artery. Extensive bilateral pulmonary emboli are visualized. Pressures were then obtained. Main PA pressure 50/16 (29) mm Hg The pigtail catheter was then exchanged over an Amplatz wire for a 100 cm angled catheter. The catheter and wire combination were then advanced into the left lower lobe pulmonary artery. The 24 [place] FlowTriever thrombectomy catheter was then advanced over the wire and into the left main pulmonary artery. Additional arteriography was performed demonstrating large volume thrombus within the left main, left upper and left lower lobe pulmonary arteries. The 77 French pigtail thrombectomy catheter was then advanced coaxially over the  wire and into the left lower lobe pulmonary artery. Mechanical thrombectomy was then performed throughout the left lower and left main pulmonary artery. Extensive mixed acute and chronic thrombus was successfully extra patent from the pulmonary arterial tree. Follow-up left pulmonary arteriography demonstrates near complete removal of thrombus with excellent opacification of the pulmonary arterial tree and broad parenchymal opacification. Minimal wedge-shaped defect persists peripherally. The 20 French thrombectomy catheter was removed. The 24 French catheter was brought back into the main pulmonary outflow tract. The angled catheter was reintroduced coaxially and used to select the right main pulmonary artery over a Bentson wire. Right sided pulmonary arteriography was performed. Extensive clot burden in the right main pulmonary artery extending into the truncus anterior and right lower lobe pulmonary arteries. The superstiff Amplatz wire was successfully advanced into the right lower lobe pulmonary artery. The 24 French thrombectomy catheter was advanced over the wire. Suction thrombectomy was again performed. Successful extirpation of extensive thrombus from the right lower lobe pulmonary artery and truncus anterior branch artery. Follow-up pulmonary arteriography demonstrates significant debulking of thrombus. There is a small amount of residual thrombus in the truncus anterior and right middle lobe pulmonary artery. Markedly improved parenchymal opacification in the right upper and lower lobes. Persistently decreased opacification in the right middle lobe pulmonary artery. At this time, patient remained hemodynamically stable. Main pulmonary artery pressure was again checked and found to be significantly improved at 48/10 (23) mm Hg. This appears to be a good clinical endpoint. Therefore, the procedure was terminated and the catheters and sheath removed. Hemostasis was attained with the assistance of an 0 silk  pursestring suture at the right common femoral entry site. FINDINGS: Initial main pulmonary arterial pressure: 50/16 (29) mm Hg Post thrombectomy main pulmonary arterial pressure: 40/10 (23) mm Hg IMPRESSION: 1. Large volume bilateral pulmonary emboli with secondary pulmonary arterial hypertension. 2. Successful extirpation of greater than 90% of the left-sided thrombus, and approximately 70% of the right-sided thrombus using mechanical thrombectomy. Electronically Signed   By: Jacqulynn Cadet M.D.   On: 05/21/2020 14:18   ECHOCARDIOGRAM COMPLETE  Result Date: 05/20/2020    ECHOCARDIOGRAM REPORT   Patient Name:   KATAYA AMUNDSON Date of Exam: 05/20/2020 Medical Rec #:  [id number]          Height:       62.5 in Accession #:  [id number]         Weight:       182.1 lb Date of Birth:  Mar 26, 1950          BSA:          1.848 m Patient Age:    70 years           BP:           107/65 mmHg Patient Gender: F                  HR:           96 bpm. Exam Location:  Inpatient Procedure: 2D Echo, Cardiac Doppler and Color Doppler Indications:     CHF-Acute Diastolic  History:         Patient has no prior history of Echocardiogram examinations.                  Acute pulmonary embolus. Hx heart arrythmia. GERD.  Sonographer:     Ross Ludwig RDCS (AE) Referring Phys:  [id number] [facility] A SMITH Diagnosing Phys: Laurance Flatten MD IMPRESSIONS  1. Right ventricular systolic function is severely reduced. The right ventricular size is severely enlarged. There is severely elevated pulmonary artery systolic pressure. The estimated right ventricular systolic pressure is 88.3 mmHg. Findings consistent with acute pulmonary with significant RV strain and severe pulmonary hypertension.  2. Left ventricular ejection fraction, by estimation, is 60 to 65%. The left ventricle has normal function. The left ventricle has no regional wall motion abnormalities. Left ventricular diastolic parameters are consistent with Grade I diastolic  dysfunction (impaired relaxation). There is the interventricular septum is flattened in systole, consistent with right ventricular pressure overload.  3. The mitral valve is grossly normal. Trivial mitral valve regurgitation.  4. Tricuspid valve regurgitation is moderate.  5. The aortic valve is tricuspid. There is mild calcification of the aortic valve. There is moderate thickening of the aortic valve. Aortic valve regurgitation is not visualized. Mild to moderate aortic valve sclerosis/calcification is present, without any evidence of aortic stenosis.  6. The inferior vena cava is dilated in size with <50% respiratory variability, suggesting right atrial pressure of 15 mmHg. Comparison(s): No prior Echocardiogram. Conclusion(s)/Recommendation(s): Patient with severe RV dysfunction and pulmonary hypertension with acute PE. Consider IR evaluation if clinically indicated as may benefit from catheter directed thrombolysis if cleared from a surgical standpoint. FINDINGS  Left Ventricle: Left ventricular ejection fraction, by estimation, is 60 to 65%. The left ventricle has normal function. The left ventricle has no regional wall motion abnormalities. The left ventricular internal cavity size was normal in size. There is  no left ventricular hypertrophy. The interventricular septum is flattened in systole, consistent with right ventricular pressure overload. Left ventricular diastolic parameters are consistent with Grade I diastolic dysfunction (impaired relaxation). Right Ventricle: The right ventricular size is severely enlarged. No increase in right ventricular wall thickness. Right ventricular systolic function is severely reduced. There is severely elevated pulmonary artery systolic pressure. The tricuspid regurgitant velocity is 4.28 m/s, and with an assumed right atrial pressure of 15 mmHg, the estimated right ventricular systolic pressure is 88.3 mmHg. Left Atrium: Left atrial size was normal in size. Right  Atrium: Right atrial size was normal in size. Pericardium: There is no evidence of pericardial effusion. Mitral Valve: The mitral valve is grossly normal. Trivial mitral valve regurgitation. MV peak gradient, 3.1 mmHg. The mean mitral valve gradient is 1.0 mmHg. Tricuspid Valve: The tricuspid valve is normal in  structure. Tricuspid valve regurgitation is moderate. Aortic Valve: The aortic valve is tricuspid. There is mild calcification of the aortic valve. There is moderate thickening of the aortic valve. Aortic valve regurgitation is not visualized. Mild to moderate aortic valve sclerosis/calcification is present, without any evidence of aortic stenosis. Aortic valve mean gradient measures 2.0 mmHg. Aortic valve peak gradient measures 4.3 mmHg. Aortic valve area, by VTI measures 2.70 cm. Pulmonic Valve: The pulmonic valve was normal in structure. Pulmonic valve regurgitation is trivial. Aorta: The aortic root and ascending aorta are structurally normal, with no evidence of dilitation. Venous: The inferior vena cava is dilated in size with less than 50% respiratory variability, suggesting right atrial pressure of 15 mmHg. IAS/Shunts: No atrial level shunt detected by color flow Doppler.  LEFT VENTRICLE PLAX 2D LVIDd:         3.50 cm  Diastology LVIDs:         2.10 cm  LV e' medial:    5.11 cm/s LV PW:         1.00 cm  LV E/e' medial:  9.8 LV IVS:        1.00 cm  LV e' lateral:   7.40 cm/s LVOT diam:     1.90 cm  LV E/e' lateral: 6.8 LV SV:         33 LV SV Index:   18 LVOT Area:     2.84 cm  RIGHT VENTRICLE            IVC RV Basal diam:  3.40 cm    IVC diam: 2.40 cm RV S prime:     9.14 cm/s TAPSE (M-mode): 1.4 cm LEFT ATRIUM           Index       RIGHT ATRIUM           Index LA diam:      2.20 cm 1.19 cm/m  RA Area:     15.10 cm LA Vol (A4C): 29.2 ml 15.80 ml/m RA Volume:   35.50 ml  19.21 ml/m  AORTIC VALVE AV Area (Vmax):    2.30 cm AV Area (Vmean):   2.76 cm AV Area (VTI):     2.70 cm AV Vmax:            104.00 cm/s AV Vmean:          56.800 cm/s AV VTI:            0.123 m AV Peak Grad:      4.3 mmHg AV Mean Grad:      2.0 mmHg LVOT Vmax:         84.20 cm/s LVOT Vmean:        55.200 cm/s LVOT VTI:          0.117 m LVOT/AV VTI ratio: 0.95  AORTA Ao Root diam: 3.60 cm Ao Asc diam:  2.90 cm MITRAL VALVE               TRICUSPID VALVE MV Area (PHT): 2.46 cm    TR Peak grad:   73.3 mmHg MV Area VTI:   1.69 cm    TR Vmax:        428.00 cm/s MV Peak grad:  3.1 mmHg MV Mean grad:  1.0 mmHg    SHUNTS MV Vmax:       0.88 m/s    Systemic VTI:  0.12 m MV Vmean:      52.5 cm/s   Systemic Diam: 1.90 cm  MV Decel Time: 309 msec MV E velocity: 50.10 cm/s MV A velocity: 75.80 cm/s MV E/A ratio:  0.66 Gwyndolyn Kaufman MD Electronically signed by Gwyndolyn Kaufman MD Signature Date/Time: 05/20/2020/1:22:39 PM    Final (Updated)    VAS [place] LOWER EXTREMITY VENOUS (DVT)  Result Date: 05/20/2020  Lower Venous DVT Study Patient Name:  ALLSION NOGALES  Date of Exam:   05/19/2020 Medical Rec #: [id number]           Accession #:    [id number] Date of Birth: 09/20/1950           Patient Gender: F Patient Age:   70Y Exam Location:  [facility] Procedure:      VAS [place] LOWER EXTREMITY VENOUS (DVT) Referring Phys: [id number] RONDELL A SMITH --------------------------------------------------------------------------------  Indications: Swelling.  Risk Factors: None identified. Limitations: Poor ultrasound/tissue interface and body habitus. Comparison Study: No prior studies. Performing Technologist: Oliver Hum RVT  Examination Guidelines: A complete evaluation includes B-mode imaging, spectral Doppler, color Doppler, and power Doppler as needed of all accessible portions of each vessel. Bilateral testing is considered an integral part of a complete examination. Limited examinations for reoccurring indications may be performed as noted. The reflux portion of the exam is performed with the patient in reverse Trendelenburg.   +---------+---------------+---------+-----------+----------+--------------+ RIGHT    CompressibilityPhasicitySpontaneityPropertiesThrombus Aging +---------+---------------+---------+-----------+----------+--------------+ CFV      Full           Yes      Yes                                 +---------+---------------+---------+-----------+----------+--------------+ SFJ      Full                                                        +---------+---------------+---------+-----------+----------+--------------+ FV Prox  Full                                                        +---------+---------------+---------+-----------+----------+--------------+ FV Mid   Full                                                        +---------+---------------+---------+-----------+----------+--------------+ FV DistalFull                                                        +---------+---------------+---------+-----------+----------+--------------+ PFV      Full                                                        +---------+---------------+---------+-----------+----------+--------------+ POP      Partial  Yes      Yes                  Acute          +---------+---------------+---------+-----------+----------+--------------+ PTV      Full                                                        +---------+---------------+---------+-----------+----------+--------------+ PERO     None                                         Acute          +---------+---------------+---------+-----------+----------+--------------+ Gastroc  Full                                                        +---------+---------------+---------+-----------+----------+--------------+   +---------+---------------+---------+-----------+----------+--------------+ LEFT     CompressibilityPhasicitySpontaneityPropertiesThrombus Aging  +---------+---------------+---------+-----------+----------+--------------+ CFV      Full           Yes      Yes                                 +---------+---------------+---------+-----------+----------+--------------+ SFJ      Full                                                        +---------+---------------+---------+-----------+----------+--------------+ FV Prox  Full                                                        +---------+---------------+---------+-----------+----------+--------------+ FV Mid   Full                                                        +---------+---------------+---------+-----------+----------+--------------+ FV Distal               Yes      Yes                                 +---------+---------------+---------+-----------+----------+--------------+ PFV      Full                                                        +---------+---------------+---------+-----------+----------+--------------+ POP      Partial  Yes      Yes                                 +---------+---------------+---------+-----------+----------+--------------+ PTV      None                                         Acute          +---------+---------------+---------+-----------+----------+--------------+ PERO     None                                         Acute          +---------+---------------+---------+-----------+----------+--------------+ Gastroc  Partial                                      Acute          +---------+---------------+---------+-----------+----------+--------------+     Summary: RIGHT: - Findings consistent with acute deep vein thrombosis involving the right popliteal vein, and right peroneal veins. - No cystic structure found in the popliteal fossa.  LEFT: - Findings consistent with acute deep vein thrombosis involving the left popliteal vein, left posterior tibial veins, left peroneal veins, and left  gastrocnemius veins. - No cystic structure found in the popliteal fossa.  *See table(s) above for measurements and observations. Electronically signed by Deitra Mayo MD on 05/20/2020 at 8:22:30 PM.    Final      Flora Lipps, MD  [facility] 05/21/2020  If 7PM-7AM, please contact night-coverage

## 2020-05-21 NOTE — Procedures (Signed)
Interventional Radiology Procedure Note  Procedure:  1.) Bilateral pulmonary angiography and extirpation of pulmonary emboli with mechanical thrombectomy   Complications: None  Estimated Blood Loss: 50 mL  Recommendations: - Initial mPAP 29 mmHG - Post mPAP 23 mmHg - Bedrest with right leg straight x 2 hrs, may then sit up - Continue heparin - Transition to NOAC when ok per neurosurgery   Signed,  Criselda Peaches, MD

## 2020-05-21 NOTE — Anesthesia Preprocedure Evaluation (Addendum)
Anesthesia Evaluation  Patient identified by MRN, date of birth, ID band Patient awake    Reviewed: Allergy & Precautions, NPO status , Patient's Chart, lab work & pertinent test results  History of Anesthesia Complications (+) PONV  Airway Mallampati: II  TM Distance: >3 FB Neck ROM: Full    Dental  (+) Teeth Intact   Pulmonary PE    + decreased breath sounds      Cardiovascular hypertension,  Rhythm:Regular Rate:Normal     Neuro/Psych negative neurological ROS  negative psych ROS   GI/Hepatic Neg liver ROS, GERD  ,diverticulosis   Endo/Other  Hypothyroidism   Renal/GU   negative genitourinary   Musculoskeletal  (+) Arthritis , Osteoarthritis,  L4 fx s/p revision POD 7   Abdominal (+)  Abdomen: soft.    Peds  Hematology   Anesthesia Other Findings   Reproductive/Obstetrics                            Anesthesia Physical Anesthesia Plan  ASA: IV  Anesthesia Plan: MAC   Post-op Pain Management:    Induction: Intravenous  PONV Risk Score and Plan: 3 and Propofol infusion, Ondansetron and Treatment may vary due to age or medical condition  Airway Management Planned: Simple Face Mask, Natural Airway and Nasal Cannula  Additional Equipment: Arterial line  Intra-op Plan:   Post-operative Plan:   Informed Consent: I have reviewed the patients History and Physical, chart, labs and discussed the procedure including the risks, benefits and alternatives for the proposed anesthesia with the patient or authorized representative who has indicated his/her understanding and acceptance.     Dental advisory given  Plan Discussed with: CRNA  Anesthesia Plan Comments: (CTA 05/20/20: Acute bilateral pulmonary emboli, symmetrically extending from the distal main pulmonary arteries to the subsegmental branches in the upper and lower lobes. There is CT evidence of right heart  strain.  ECHO 05/20/20: 1. Right ventricular systolic function is severely reduced. The right  ventricular size is severely enlarged. There is severely elevated  pulmonary artery systolic pressure. The estimated right ventricular  systolic pressure is 73.7 mmHg. Findings  consistent with acute pulmonary with significant RV strain and severe  pulmonary hypertension.  2. Left ventricular ejection fraction, by estimation, is 60 to 65%. The  left ventricle has normal function. The left ventricle has no regional  wall motion abnormalities. Left ventricular diastolic parameters are  consistent with Grade I diastolic  dysfunction (impaired relaxation). There is the interventricular septum is  flattened in systole, consistent with right ventricular pressure overload.  3. The mitral valve is grossly normal. Trivial mitral valve  regurgitation.  4. Tricuspid valve regurgitation is moderate.  5. The aortic valve is tricuspid. There is mild calcification of the  aortic valve. There is moderate thickening of the aortic valve. Aortic  valve regurgitation is not visualized. Mild to moderate aortic valve  sclerosis/calcification is present, without  any evidence of aortic stenosis.  6. The inferior vena cava is dilated in size with <50% respiratory  variability, suggesting right atrial pressure of 15 mmHg.   Lab Results      Component                Value               Date                      WBC  9.7                 05/21/2020                HGB                      9.3 (L)             05/21/2020                HCT                      30.4 (L)            05/21/2020                MCV                      92.4                05/21/2020                PLT                      213                 05/21/2020            Lab Results      Component                Value               Date                      NA                       137                 05/15/2020                 K                        4.2                 05/15/2020                CO2                      25                  05/15/2020                GLUCOSE                  157 (H)             05/15/2020                BUN                      20                  05/15/2020                CREATININE               0.97  05/15/2020                CALCIUM                  8.3 (L)             05/15/2020                GFRNONAA                 >60                 05/15/2020                GFRAA                    >60                 08/02/2017          )       Anesthesia Quick Evaluation

## 2020-05-21 NOTE — Progress Notes (Signed)
Pt currently at IR for mechanical thrombectomy with severe R heart strain. She is now 7 days post-op from decompression / revision of L4 construct and extension / revision of construct from L1-ilium.   -okay with therapeutic anticoagulation given the life threatening nature of a large PE -from a risk:benefit standpoint, again given that this is a potentially life threatening issue, if thrombolytics are needed then intra-arterial is obviously preferred. But if she develops life threatening hypoxemia or hypotension and systemic fibrinolytics are the only available option then can proceed and we will have to watch her wound / neurologic function closely. A lumbar hematoma with resulting neurologic deficit would certainly be morbid but potentially treatable with drainage and non-fatal, compared to an extending PE with cardiopulmonary compromise.

## 2020-05-22 ENCOUNTER — Encounter (HOSPITAL_COMMUNITY): Payer: Self-pay | Admitting: Interventional Radiology

## 2020-05-22 LAB — COMPREHENSIVE METABOLIC PANEL
ALT: 46 U/L — ABNORMAL HIGH (ref 0–44)
AST: 24 U/L (ref 15–41)
Albumin: 2.5 g/dL — ABNORMAL LOW (ref 3.5–5.0)
Alkaline Phosphatase: 111 U/L (ref 38–126)
Anion gap: 8 (ref 5–15)
BUN: 25 mg/dL — ABNORMAL HIGH (ref 8–23)
CO2: 25 mmol/L (ref 22–32)
Calcium: 7.6 mg/dL — ABNORMAL LOW (ref 8.9–10.3)
Chloride: 102 mmol/L (ref 98–111)
Creatinine, Ser: 0.73 mg/dL (ref 0.44–1.00)
GFR, Estimated: >60 mL/min (ref >60)
Glucose, Bld: 110 mg/dL — ABNORMAL HIGH (ref 70–99)
Potassium: 3.3 mmol/L — ABNORMAL LOW (ref 3.5–5.1)
Sodium: 135 mmol/L (ref 135–145)
Total Bilirubin: 0.5 mg/dL (ref 0.3–1.2)
Total Protein: 4.6 g/dL — ABNORMAL LOW (ref 6.5–8.1)

## 2020-05-22 LAB — HEPARIN LEVEL (UNFRACTIONATED)
Heparin Unfractionated: 0.46 IU/mL (ref 0.30–0.70)
Heparin Unfractionated: 0.41 IU/mL (ref 0.30–0.70)
Heparin Unfractionated: 0.29 IU/mL — ABNORMAL LOW (ref 0.30–0.70)

## 2020-05-22 LAB — CBC
HCT: 24.7 % — ABNORMAL LOW (ref 36.0–46.0)
Hemoglobin: 7.8 g/dL — ABNORMAL LOW (ref 12.0–15.0)
MCH: 29.3 pg (ref 26.0–34.0)
MCHC: 31.6 g/dL (ref 30.0–36.0)
MCV: 92.9 fL (ref 80.0–100.0)
Platelets: 162 10*3/uL (ref 150–400)
RBC: 2.66 MIL/uL — ABNORMAL LOW (ref 3.87–5.11)
RDW: 15.4 % (ref 11.5–15.5)
WBC: 11 10*3/uL — ABNORMAL HIGH (ref 4.0–10.5)
nRBC: 0.6 % — ABNORMAL HIGH (ref 0.0–0.2)

## 2020-05-22 LAB — PHOSPHORUS: Phosphorus: 3.9 mg/dL (ref 2.5–4.6)

## 2020-05-22 LAB — SARS CORONAVIRUS 2 (TAT 6-24 HRS): SARS Coronavirus 2: NEGATIVE

## 2020-05-22 LAB — MAGNESIUM: Magnesium: 2.1 mg/dL (ref 1.7–2.4)

## 2020-05-22 MED ORDER — POTASSIUM CHLORIDE CRYS ER 20 MEQ PO TBCR
40.0000 meq | EXTENDED_RELEASE_TABLET | Freq: Every day | ORAL | Status: DC
  Administered 2020-05-23: 40 meq via ORAL
  Filled 2020-05-22: qty 2

## 2020-05-22 MED ORDER — POTASSIUM CHLORIDE CRYS ER 20 MEQ PO TBCR
40.0000 meq | EXTENDED_RELEASE_TABLET | Freq: Once | ORAL | Status: AC
  Administered 2020-05-22: 40 meq via ORAL
  Filled 2020-05-22: qty 2

## 2020-05-22 NOTE — Anesthesia Postprocedure Evaluation (Signed)
Anesthesia Post Note  Patient: CYNCERE RUHE  Procedure(s) Performed: IR WITH ANESTHESIA (N/A )     Patient location during evaluation: PACU Anesthesia Type: MAC Level of consciousness: awake and alert Pain management: pain level controlled Vital Signs Assessment: post-procedure vital signs reviewed and stable Respiratory status: spontaneous breathing, nonlabored ventilation, respiratory function stable and patient connected to nasal cannula oxygen Cardiovascular status: stable and blood pressure returned to baseline Postop Assessment: no apparent nausea or vomiting Anesthetic complications: no   No complications documented.  Last Vitals:  Vitals:   05/22/20 0334 05/22/20 0744  BP: (!) 102/52 104/63  Pulse: 71 64  Resp: 16 20  Temp: 36.6 C 36.6 C  SpO2: 97% 99%    Last Pain:  Vitals:   05/22/20 0946  TempSrc:   PainSc: 1                  Arvin Abello P Jasamine Pottinger

## 2020-05-22 NOTE — Progress Notes (Signed)
ANTICOAGULATION CONSULT NOTE - Follow Up Consult  Pharmacy Consult for Heparin Indication: DVT and PE  Allergies  Allergen Reactions  . Azithromycin Diarrhea  . Levaquin [Levofloxacin] Nausea And Vomiting  . Ultram [Tramadol] Nausea And Vomiting    Patient Measurements: Height: 5' 2.52" (158.8 cm) Weight: 82.6 kg (182 lb 1.6 oz) IBW/kg (Calculated) : 51.3 Heparin Dosing Weight:    Vital Signs: Temp: 97.8 F (36.6 C) (05/22 0744) Temp Source: Oral (05/22 0744) BP: 104/63 (05/22 0744) Pulse Rate: 64 (05/22 0744)  Labs: Recent Labs    05/19/20 1509 05/19/20 2052 05/21/20 0440 05/21/20 1415 05/22/20 0018 05/22/20 0658 05/22/20 1305  HGB 10.6* 9.7* 9.3*  --  7.8*  --   --   HCT 33.8* 30.8* 30.4*  --  24.7*  --   --   PLT  --   --  213  --  162  --   --   HEPARINUNFRC  --   --  >1.10*   < > 0.46 0.41 0.29*  CREATININE  --   --   --   --  0.73  --   --   TROPONINIHS 49*  --   --   --   --   --   --    < > = values in this interval not displayed.    Estimated Creatinine Clearance: 65.9 mL/min (by C-G formula based on SCr of 0.73 mg/dL).   Assessment: Anticoag: new DVT and PE with severe RHS, s/p back surgery., D-Dimer 16.44 (5/19). Post-op anemia 5/15, transfused, Hgb now 9.3 and plts WNL. AM HL >1.1. - 5/21 IR: Bilateral pulmonary angiography and extirpation of pulmonary emboli with mechanical thrombectomy. Con't IV heparin post-IR (later noted got 7500 units heparin in procedure) - Hep level 0.41 remain in goal range this AM. Hgb down to 7.8 and Plts down to 162. Confirmatory level down to 0.29 (less than goal)  Goal of Therapy:  Heparin level 0.3-0.7 units/ml Monitor platelets by anticoagulation protocol: Yes   Plan:  Increase IV heparin to 900 units/hr Daily HL and CBC F/u conversion to [facility] when Hgb remains stable.   Sylvanus Telford S. Alford Highland, PharmD, BCPS Clinical Staff Pharmacist [email or web address] Alford Highland, Kier Smead Stillinger 05/22/2020,2:05 PM

## 2020-05-22 NOTE — Progress Notes (Signed)
ANTICOAGULATION CONSULT NOTE - Follow Up Consult  Pharmacy Consult for Heparin Indication: DVT and PE  Allergies  Allergen Reactions  . Azithromycin Diarrhea  . Levaquin [Levofloxacin] Nausea And Vomiting  . Ultram [Tramadol] Nausea And Vomiting    Patient Measurements: Height: 5' 2.52" (158.8 cm) Weight: 82.6 kg (182 lb 1.6 oz) IBW/kg (Calculated) : 51.3 Heparin Dosing Weight:    Vital Signs: Temp: 97.8 F (36.6 C) (05/22 0744) Temp Source: Oral (05/22 0744) BP: 104/63 (05/22 0744) Pulse Rate: 64 (05/22 0744)  Labs: Recent Labs    05/19/20 1509 05/19/20 2052 05/19/20 2052 05/21/20 0440 05/21/20 1415 05/22/20 0018 05/22/20 0658  HGB 10.6* 9.7*  --  9.3*  --  7.8*  --   HCT 33.8* 30.8*  --  30.4*  --  24.7*  --   PLT  --   --   --  213  --  162  --   HEPARINUNFRC  --   --    < > >1.10* >1.10* 0.46 0.41  CREATININE  --   --   --   --   --  0.73  --   TROPONINIHS 49*  --   --   --   --   --   --    < > = values in this interval not displayed.    Estimated Creatinine Clearance: 65.9 mL/min (by C-G formula based on SCr of 0.73 mg/dL).   Assessment: Anticoag: new DVT and PE with severe RHS, s/p back surgery., D-Dimer 16.44 (5/19). Post-op anemia 5/15, transfused, Hgb now 9.3 and plts WNL. AM HL >1.1. - 5/21 IR: Bilateral pulmonary angiography and extirpation of pulmonary emboli with mechanical thrombectomy. Con't IV heparin post-IR (later noted got 7500 units heparin in procedure) - Hep level 0.41 remain in goal range this AM. Hgb down to 7.8 and Plts down to 162.  Goal of Therapy:  Heparin level 0.3-0.7 units/ml Monitor platelets by anticoagulation protocol: Yes   Plan:  Con't IV heparin 800 units/hr Confirm level in 6 hrs Daily HL and CBC   Lynell Greenhouse S. Alford Highland, PharmD, BCPS Clinical Staff Pharmacist [email or web address] Alford Highland, [place] 05/22/2020,8:25 AM

## 2020-05-22 NOTE — Plan of Care (Signed)

## 2020-05-22 NOTE — Progress Notes (Signed)
ANTICOAGULATION CONSULT NOTE - Follow Up Consult  Pharmacy Consult for  Heparin Indication: pulmonary embolus  Allergies  Allergen Reactions  . Azithromycin Diarrhea  . Levaquin [Levofloxacin] Nausea And Vomiting  . Ultram [Tramadol] Nausea And Vomiting    Patient Measurements: Height: 5' 2.52" (158.8 cm) Weight: 82.6 kg (182 lb 1.6 oz) IBW/kg (Calculated) : 51.3  Kg Heparin dosing weight: 69.6 kg    Vital Signs: Temp: 98.2 F (36.8 C) (05/21 2332) Temp Source: Oral (05/21 2332) BP: 98/55 (05/21 2332) Pulse Rate: 75 (05/21 2332)  Labs: Recent Labs    05/19/20 1509 05/19/20 2052 05/21/20 0440 05/21/20 1415 05/22/20 0018  HGB 10.6* 9.7* 9.3*  --  7.8*  HCT 33.8* 30.8* 30.4*  --  24.7*  PLT  --   --  213  --  162  HEPARINUNFRC  --   --  >1.10* >1.10* 0.46  CREATININE  --   --   --   --  0.73  TROPONINIHS 49*  --   --   --   --     Estimated Creatinine Clearance: 65.9 mL/min (by C-G formula based on SCr of 0.73 mg/dL).  Assessment: 70 year old female s/p spinal surgery 5/14 now with PE. Received Lovenox at 11 am on 5/20.   Switched to IV heparin  -s/p IR for mechanical thrombectomy. It appears that 7500 units of heparin was given in procedure per anesthesia records  Heparin level therapeutic (0.46) on gtt at 800 units/hr. No bleeding noted.  Goal of Therapy:  Heparin level 0.3-0.7 units/ml Monitor platelets by anticoagulation protocol: Yes   Plan:  Continue heparin at 800 units/hr F/u confirm heparin level in 6 hours to make sure remains therapeutic  Sherlon Handing, PharmD, BCPS Please see [email or web address] for complete clinical pharmacist phone list 05/22/2020 1:31 AM

## 2020-05-22 NOTE — Progress Notes (Signed)
Pt seen on rounds, no CP / SOB, does have some b/l calf tenderness, feels much better p thrombectomy. Strength 5/5 in BLE but pain limited at the calves, no significant numbness, incision c/d/i without any signs of bleeding.  -no change in neurosurgical plan of care, okay for transition to oral / long term anticoagulation whenever appropriate from a medicine standpoint

## 2020-05-22 NOTE — Progress Notes (Signed)
PROGRESS NOTE  Karen Ramirez ZDG:[id number] DOB: June 06, 1950 DOA: 05/14/2020 PCP: Adin Hector, MD   LOS: 8 days   Brief narrative:   Karen Ramirez is a 70 y.o. female with medical history significant of hypothyroidism, hyperlipidemia, nephrolithiasis, PMR, and lumbar stenosis with herniated disks presented to hospital with continued back pain.  Patient had history of surgical decompression and fusion of L1 L4 with Dr. Ellene Route on 04/05/2020.  Following surgery patient was significantly debilitated and was transferred to skilled nursing facility for rehab but had continued to feel pain radiating down her legs.  During her postoperative visit she was noted to have L4 fracture with lost fixation.  She was then admitted back to the hospital on 514 and underwent revision surgery to compress L4 and perform fixation to the pelvis.  On postoperative day 4 patient's hemoglobin drifted down so medical team was consulted.  She was also needing some oxygen which prompted her medical attention.  Assessment/Plan:  Principal Problem:   Fatigue fracture of vertebra, lumbar region, sequela of fracture Active Problems:   Lumbar stenosis with neurogenic claudication   Pressure injury of skin   Acute pulmonary embolism (HCC)  Acute respiratory failure with hypoxia:  Patient was noted to be hypoxic during hospitalization and required up to 4 L of oxygen.  VQ scan was initially performed due to lack of contrast but showed multiple defects.  DVT scan was positive for DVT in the right lower extremity as well, so patient was put on Lovenox.  Subsequently, it was changed to heparin drip after CT angiogram showed pulmonary embolism.  Currently on heparin drip.  Acute pulmonary embolism from high probability VQ scan, right lower extremity DVT.  2D echocardiogram performed on 05/20/2020 showed severely reduced RV function with enlarged right ventricle and elevated pulmonary artery pressure at 55 consistent with  acute pulmonary RV strain and severe pulmonary hypertension.   CT angiogram was done and the recommendation was to proceed with mechanical thrombectomy.  Patient was initiated on heparin drip and underwent mechanical thrombectomy on 05/21/2020.  Post thrombectomy patient has remained stable.  There is a slight decrease in hemoglobin today.  We will continue to monitor closely on heparin drip.  Neurosurgery is okay with anticoagulation options.  Consider oral anticoagulant option if hemoglobin continues to remain stable with 1.  Status post revision spine surgery.  Management as per neurosurgical team.  Elevated BNP and troponins secondary to RV strain and pulmonary embolism.  Acute blood loss anemia likely postoperative.  Hemoglobin as low as 7.3.  Received packed RBC transfusion with  hemoglobin at 9.7>9.3 >7.8.   No mention of GI bleed in the past or any external bleeding currently.  Need to continue to monitor.  Continue on heparin drip for now.  Acute urinary retention.  Patient is currently on a Foley catheter.  Essential hypertension: Blood pressure borderline low.  Hold HCTZ for now   Hypothyroidism Continue Synthroid.  Hypokalemia.  We will replenish orally.  Check levels in a.m.   DVT prophylaxis: SCD's Start: 05/14/20 1525   Code Status: Full code  Family Communication:  I again spoke with the patient's husband at bedside.   Status is: Inpatient  Remains inpatient appropriate because:IV treatments appropriate due to intensity of illness or inability to take PO, Inpatient level of care appropriate due to severity of illness, status post mechanical thrombectomy   Dispo: The patient is from: Home  Anticipated d/c is to: SNF              Patient currently is not medically stable to d/c.  Monitor hemoglobin, change to NOACs if hemoglobin is stable.   Difficult to place patient No  Consultants:  Neuro surgery  IR  Procedures:  Revision spine surgery by  neurosurgery  Mechanical thrombectomy on 05/21/2020  Anti-infectives:  . None  Anti-infectives (From admission, onward)   Start     Dose/Rate Route Frequency Ordered Stop   05/14/20 1930  ceFAZolin (ANCEF) IVPB 2g/100 mL premix        2 g 200 mL/hr over 30 Minutes Intravenous Every 8 hours 05/14/20 1525 05/15/20 0400   05/14/20 0730  ceFAZolin (ANCEF) IVPB 2g/100 mL premix        2 g 200 mL/hr over 30 Minutes Intravenous  Once 05/14/20 0728 05/14/20 1130   05/14/20 0727  ceFAZolin (ANCEF) 2-4 GM/100ML-% IVPB       Note to Pharmacy: Grace Blight   : cabinet override      05/14/20 0727 05/14/20 0740     Subjective: Today, patient was seen and examined at bedside.  Denies any chest pain, dizziness, shortness of breath, fever or chills.  Feels constipated.  Objective: Vitals:   05/22/20 0334 05/22/20 0744  BP: (!) 102/52 104/63  Pulse: 71 64  Resp: 16 20  Temp: 97.9 F (36.6 C) 97.8 F (36.6 C)  SpO2: 97% 99%    Intake/Output Summary (Last 24 hours) at 05/22/2020 1354 Last data filed at 05/22/2020 0700 Gross per 24 hour  Intake 508.39 ml  Output 1025 ml  Net -516.61 ml   Filed Weights   05/14/20 0641 05/15/20 0612  Weight: 78.5 kg 82.6 kg   Body mass index is 32.75 kg/m.   Physical Exam:  GENERAL: Patient is alert awake and oriented. Not in obvious distress.  On nasal cannula oxygen, obese HENT: No scleral pallor or icterus. Pupils equally reactive to light. Oral mucosa is moist NECK: is supple, no gross swelling noted. CHEST: Clear to auscultation.  Diminished breath sounds bilaterally. CVS: S1 and S2 heard, no murmur. Regular rate and rhythm.  ABDOMEN: Soft, non-tender, bowel sounds are present. EXTREMITIES: No edema.  Mild tenderness of the left leg.  Mild edema of the right lower extremity. CNS: Cranial nerves are intact. No focal motor deficits. SKIN: warm and dry without rashes.  Data Review: I have personally reviewed the following laboratory data and  studies,  CBC: Recent Labs  Lab 05/18/20 0344 05/18/20 2230 05/19/20 1509 05/19/20 2052 05/21/20 0440 05/22/20 0018  WBC 9.3 9.2  --   --  9.7 11.0*  HGB 7.3* 9.1* 10.6* 9.7* 9.3* 7.8*  HCT 23.6* 29.1* 33.8* 30.8* 30.4* 24.7*  MCV 93.7 91.8  --   --  92.4 92.9  PLT 206 197  --   --  213 0000000   Basic Metabolic Panel: Recent Labs  Lab 05/22/20 0018  NA 135  K 3.3*  CL 102  CO2 25  GLUCOSE 110*  BUN 25*  CREATININE 0.73  CALCIUM 7.6*  MG 2.1  PHOS 3.9   Liver Function Tests: Recent Labs  Lab 05/22/20 0018  AST 24  ALT 46*  ALKPHOS 111  BILITOT 0.5  PROT 4.6*  ALBUMIN 2.5*   No results for input(s): LIPASE, AMYLASE in the last 168 hours. No results for input(s): AMMONIA in the last 168 hours. Cardiac Enzymes: No results for input(s): CKTOTAL, CKMB, CKMBINDEX, TROPONINI in  the last 168 hours. BNP (last 3 results) Recent Labs    05/19/20 0951  BNP 666.3*    ProBNP (last 3 results) No results for input(s): PROBNP in the last 8760 hours.  CBG: No results for input(s): GLUCAP in the last 168 hours. Recent Results (from the past 240 hour(s))  SARS CORONAVIRUS 2 (TAT 6-24 HRS) Nasopharyngeal Nasopharyngeal Swab     Status: None   Collection Time: 05/12/20  3:26 PM   Specimen: Nasopharyngeal Swab  Result Value Ref Range Status   SARS Coronavirus 2 NEGATIVE NEGATIVE Final    Comment: (NOTE) SARS-CoV-2 target nucleic acids are NOT DETECTED.  The SARS-CoV-2 RNA is generally detectable in upper and lower respiratory specimens during the acute phase of infection. Negative results do not preclude SARS-CoV-2 infection, do not rule out co-infections with other pathogens, and should not be used as the sole basis for treatment or other patient management decisions. Negative results must be combined with clinical observations, patient history, and epidemiological information. The expected result is Negative.  Fact Sheet for  Patients: SugarRoll.be  Fact Sheet for Healthcare Providers: https://www.woods-mathews.com/  This test is not yet approved or cleared by the [place] FDA and  has been authorized for detection and/or diagnosis of SARS-CoV-2 by FDA under an Emergency Use Authorization (EUA). This EUA will remain  in effect (meaning this test can be used) for the duration of the COVID-19 declaration under Se ction 564(b)(1) of the Act, 21 U.S.C. section 360bbb-3(b)(1), unless the authorization is terminated or revoked sooner.  Performed at [facility], [place] [address], [place], [place] [address]   Surgical pcr screen     Status: Abnormal   Collection Time: 05/12/20  3:27 PM   Specimen: Nasal Mucosa; Nasal Swab  Result Value Ref Range Status   MRSA, PCR NEGATIVE NEGATIVE Final   Staphylococcus aureus POSITIVE (A) NEGATIVE Final    Comment: (NOTE) The Xpert SA Assay (FDA approved for NASAL specimens in patients 79 years of age and older), is one component of a comprehensive surveillance program. It is not intended to diagnose infection nor to guide or monitor treatment. Performed at [facility], [place] [address] [place], [place] [address]   SARS CORONAVIRUS 2 (TAT 6-24 HRS) Nasopharyngeal Nasopharyngeal Swab     Status: None   Collection Time: 05/18/20  1:45 PM   Specimen: Nasopharyngeal Swab  Result Value Ref Range Status   SARS Coronavirus 2 NEGATIVE NEGATIVE Final    Comment: (NOTE) SARS-CoV-2 target nucleic acids are NOT DETECTED.  The SARS-CoV-2 RNA is generally detectable in upper and lower respiratory specimens during the acute phase of infection. Negative results do not preclude SARS-CoV-2 infection, do not rule out co-infections with other pathogens, and should not be used as the sole basis for treatment or other patient management decisions. Negative results must be combined with clinical observations, patient history, and  epidemiological information. The expected result is Negative.  Fact Sheet for Patients: SugarRoll.be  Fact Sheet for Healthcare Providers: https://www.woods-mathews.com/  This test is not yet approved or cleared by the [place] FDA and  has been authorized for detection and/or diagnosis of SARS-CoV-2 by FDA under an Emergency Use Authorization (EUA). This EUA will remain  in effect (meaning this test can be used) for the duration of the COVID-19 declaration under Se ction 564(b)(1) of the Act, 21 U.S.C. section 360bbb-3(b)(1), unless the authorization is terminated or revoked sooner.  Performed at [facility], [place]  [address] [place], [place] [address]   Surgical pcr screen     Status: None   Collection Time: 05/21/20  2:00 AM   Specimen: Nasal Mucosa; Nasal Swab  Result Value Ref Range Status   MRSA, PCR NEGATIVE NEGATIVE Final   Staphylococcus aureus NEGATIVE NEGATIVE Final    Comment: (NOTE) The Xpert SA Assay (FDA approved for NASAL specimens in patients 52 years of age and older), is one component of a comprehensive surveillance program. It is not intended to diagnose infection nor to guide or monitor treatment. Performed at [facility], [place] [address], [place], [place] [address]      Studies: CT ANGIO CHEST PE W OR WO CONTRAST  Result Date: 05/20/2020 CLINICAL DATA:  70 year old female with recent positive V/Q scan for pulmonary embolism. EXAM: CT ANGIOGRAPHY CHEST WITH CONTRAST TECHNIQUE: Multidetector CT imaging of the chest was performed using the standard protocol during bolus administration of intravenous contrast. Multiplanar CT image reconstructions and MIPs were obtained to evaluate the vascular anatomy. CONTRAST:  Fifty mL Omnipaque 350, intravenous COMPARISON:  05/19/2020, 10/09/2018 FINDINGS: Cardiovascular: Satisfactory opacification of the pulmonary arteries to the segmental level. Acute bilateral  pulmonary emboli extending from the distal main bilateral pulmonary arteries to the upper and lower subsegmental branches, mostly nonocclusive throughout. The right to left ventricular ratio is approximately 1.4. There is mild prominence of the main pulmonary artery measuring up to 31 mm. There is reflux of contrast into the perihepatic IVC and central hepatic veins. No pericardial effusion. Mediastinum/Nodes: No enlarged mediastinal, hilar, or axillary lymph nodes. Thyroid gland, trachea, and esophagus demonstrate no significant findings. Lungs/Pleura: Diffuse mosaic attenuation pattern. Scattered interlobular septal thickening. Mild bibasilar subsegmental atelectasis. No evidence of pulmonary infarction. No significant pleural effusion or evidence of pneumothorax. No suspicious pulmonary nodules. Upper Abdomen: Diffusely decreased attenuation of the hepatic parenchyma. The remaining visualized upper abdomen is otherwise within normal limits. Musculoskeletal: Mild multilevel degenerative changes of the thoracic spine. No acute osseous abnormality or aggressive appearing osseous lesion. Review of the MIP images confirms the above findings. IMPRESSION: Vascular: Acute bilateral pulmonary emboli, symmetrically extending from the distal main pulmonary arteries to the subsegmental branches in the upper and lower lobes. There is CT evidence of right heart strain. Non-Vascular: 1. Mild pulmonary edema.  No evidence of pulmonary infarction. 2. Hepatic steatosis. These results were relayed via Epic secure chat at the time of interpretation on 05/20/2020 at 4:22 pm to provider [facility], who acknowledged these results. Ruthann Cancer, MD [facility] Electronically Signed   By: Ruthann Cancer MD   On: 05/20/2020 16:25   IR Angiogram Pulmonary Bilateral Selective  Result Date: 05/21/2020 INDICATION: 70 year old female 5 days status post L4 laminectomy and  decompressive spinal surgery now with high intermediate risk acute PE with right heart strain. Given recent surgical intervention on the lumbar spine, she is a poor candidate for catheter directed thrombolysis. Therefore, we plan to proceed with catheter directed mechanical thrombectomy. EXAM: 1. Ultrasound-guided puncture right common femoral vein 2. Right iliac and inferior vena cavagram 3. Catheterization of the main pulmonary artery with pulmonary arteriogram and pulmonary artery pressure measurements 4. Catheterization of the left lower lobe pulmonary artery with arteriogram 5. Mechanical thrombectomy with extirpation of clot from the left main and left lower lobe pulmonary arteries 6. Catheterization of the right pulmonary artery with arteriogram 7. Mechanical thrombectomy with extirpation of clot from the right lower lobe and right main pulmonary arteries COMPARISON:  CTA  chest 05/20/2020 MEDICATIONS: None. ANESTHESIA/SEDATION: Deep sedation and hemodynamic support provided by anesthesiology. FLUOROSCOPY TIME:  Fluoroscopy Time: 25 minutes 24 seconds (187 mGy). COMPLICATIONS: None immediate. TECHNIQUE: Informed written consent was obtained from the patient after a thorough discussion of the procedural risks, benefits and alternatives. All questions were addressed. Maximal Sterile Barrier Technique was utilized including caps, mask, sterile gowns, sterile gloves, sterile drape, hand hygiene and skin antiseptic. A timeout was performed prior to the initiation of the procedure. The right common femoral vein was interrogated with ultrasound and found to be widely patent. An image was obtained and stored for the medical record. Local anesthesia was attained by infiltration with 1% lidocaine. A small dermatotomy was made. Under real-time sonographic guidance, the vessel was punctured with a 21 gauge micropuncture needle. Using standard technique, the initial micro needle was exchanged over a 0.018 micro wire for a  transitional 4 [place] micro sheath. The micro sheath was then exchanged over a 0.035 wire for a 14 French fascial dilator and the soft tissue tract was dilated. Soft tissue tract was then serially dilated to 20 [place]. A 24 French dry seal sheath was then advanced over the wire and positioned in the inferior vena cava. A 6 French angled pigtail catheter was then advanced through the tricuspid valve and up into the main pulmonary outflow tract. An initial pulmonary arteriogram was performed confirming the catheters position in the main pulmonary artery. As the injection was performed, the catheter flipped into the left main pulmonary artery. Extensive bilateral pulmonary emboli are visualized. Pressures were then obtained. Main PA pressure 50/16 (29) mm Hg The pigtail catheter was then exchanged over an Amplatz wire for a 100 cm angled catheter. The catheter and wire combination were then advanced into the left lower lobe pulmonary artery. The 24 [place] FlowTriever thrombectomy catheter was then advanced over the wire and into the left main pulmonary artery. Additional arteriography was performed demonstrating large volume thrombus within the left main, left upper and left lower lobe pulmonary arteries. The 82 French pigtail thrombectomy catheter was then advanced coaxially over the wire and into the left lower lobe pulmonary artery. Mechanical thrombectomy was then performed throughout the left lower and left main pulmonary artery. Extensive mixed acute and chronic thrombus was successfully extra patent from the pulmonary arterial tree. Follow-up left pulmonary arteriography demonstrates near complete removal of thrombus with excellent opacification of the pulmonary arterial tree and broad parenchymal opacification. Minimal wedge-shaped defect persists peripherally. The 20 French thrombectomy catheter was removed. The 24 French catheter was brought back into the main pulmonary outflow tract. The angled  catheter was reintroduced coaxially and used to select the right main pulmonary artery over a Bentson wire. Right sided pulmonary arteriography was performed. Extensive clot burden in the right main pulmonary artery extending into the truncus anterior and right lower lobe pulmonary arteries. The superstiff Amplatz wire was successfully advanced into the right lower lobe pulmonary artery. The 24 French thrombectomy catheter was advanced over the wire. Suction thrombectomy was again performed. Successful extirpation of extensive thrombus from the right lower lobe pulmonary artery and truncus anterior branch artery. Follow-up pulmonary arteriography demonstrates significant debulking of thrombus. There is a small amount of residual thrombus in the truncus anterior and right middle lobe pulmonary artery. Markedly improved parenchymal opacification in the right upper and lower lobes. Persistently decreased opacification in the right middle lobe pulmonary artery. At this time, patient remained hemodynamically stable. Main pulmonary artery pressure was again checked and found to  be significantly improved at 48/10 (23) mm Hg. This appears to be a good clinical endpoint. Therefore, the procedure was terminated and the catheters and sheath removed. Hemostasis was attained with the assistance of an 0 silk pursestring suture at the right common femoral entry site. FINDINGS: Initial main pulmonary arterial pressure: 50/16 (29) mm Hg Post thrombectomy main pulmonary arterial pressure: 40/10 (23) mm Hg IMPRESSION: 1. Large volume bilateral pulmonary emboli with secondary pulmonary arterial hypertension. 2. Successful extirpation of greater than 90% of the left-sided thrombus, and approximately 70% of the right-sided thrombus using mechanical thrombectomy. Electronically Signed   By: Jacqulynn Cadet M.D.   On: 05/21/2020 14:18   IR Angiogram Selective Each Additional Vessel  Result Date: 05/21/2020 INDICATION: 70 year old  female 5 days status post L4 laminectomy and decompressive spinal surgery now with high intermediate risk acute PE with right heart strain. Given recent surgical intervention on the lumbar spine, she is a poor candidate for catheter directed thrombolysis. Therefore, we plan to proceed with catheter directed mechanical thrombectomy. EXAM: 1. Ultrasound-guided puncture right common femoral vein 2. Right iliac and inferior vena cavagram 3. Catheterization of the main pulmonary artery with pulmonary arteriogram and pulmonary artery pressure measurements 4. Catheterization of the left lower lobe pulmonary artery with arteriogram 5. Mechanical thrombectomy with extirpation of clot from the left main and left lower lobe pulmonary arteries 6. Catheterization of the right pulmonary artery with arteriogram 7. Mechanical thrombectomy with extirpation of clot from the right lower lobe and right main pulmonary arteries COMPARISON:  CTA chest 05/20/2020 MEDICATIONS: None. ANESTHESIA/SEDATION: Deep sedation and hemodynamic support provided by anesthesiology. FLUOROSCOPY TIME:  Fluoroscopy Time: 25 minutes 24 seconds (187 mGy). COMPLICATIONS: None immediate. TECHNIQUE: Informed written consent was obtained from the patient after a thorough discussion of the procedural risks, benefits and alternatives. All questions were addressed. Maximal Sterile Barrier Technique was utilized including caps, mask, sterile gowns, sterile gloves, sterile drape, hand hygiene and skin antiseptic. A timeout was performed prior to the initiation of the procedure. The right common femoral vein was interrogated with ultrasound and found to be widely patent. An image was obtained and stored for the medical record. Local anesthesia was attained by infiltration with 1% lidocaine. A small dermatotomy was made. Under real-time sonographic guidance, the vessel was punctured with a 21 gauge micropuncture needle. Using standard technique, the initial micro needle  was exchanged over a 0.018 micro wire for a transitional 4 [place] micro sheath. The micro sheath was then exchanged over a 0.035 wire for a 14 French fascial dilator and the soft tissue tract was dilated. Soft tissue tract was then serially dilated to 20 [place]. A 24 French dry seal sheath was then advanced over the wire and positioned in the inferior vena cava. A 6 French angled pigtail catheter was then advanced through the tricuspid valve and up into the main pulmonary outflow tract. An initial pulmonary arteriogram was performed confirming the catheters position in the main pulmonary artery. As the injection was performed, the catheter flipped into the left main pulmonary artery. Extensive bilateral pulmonary emboli are visualized. Pressures were then obtained. Main PA pressure 50/16 (29) mm Hg The pigtail catheter was then exchanged over an Amplatz wire for a 100 cm angled catheter. The catheter and wire combination were then advanced into the left lower lobe pulmonary artery. The 24 [place] FlowTriever thrombectomy catheter was then advanced over the wire and into the left main pulmonary artery. Additional arteriography was performed demonstrating large volume thrombus within  the left main, left upper and left lower lobe pulmonary arteries. The 38 French pigtail thrombectomy catheter was then advanced coaxially over the wire and into the left lower lobe pulmonary artery. Mechanical thrombectomy was then performed throughout the left lower and left main pulmonary artery. Extensive mixed acute and chronic thrombus was successfully extra patent from the pulmonary arterial tree. Follow-up left pulmonary arteriography demonstrates near complete removal of thrombus with excellent opacification of the pulmonary arterial tree and broad parenchymal opacification. Minimal wedge-shaped defect persists peripherally. The 20 French thrombectomy catheter was removed. The 24 French catheter was brought back into the main  pulmonary outflow tract. The angled catheter was reintroduced coaxially and used to select the right main pulmonary artery over a Bentson wire. Right sided pulmonary arteriography was performed. Extensive clot burden in the right main pulmonary artery extending into the truncus anterior and right lower lobe pulmonary arteries. The superstiff Amplatz wire was successfully advanced into the right lower lobe pulmonary artery. The 24 French thrombectomy catheter was advanced over the wire. Suction thrombectomy was again performed. Successful extirpation of extensive thrombus from the right lower lobe pulmonary artery and truncus anterior branch artery. Follow-up pulmonary arteriography demonstrates significant debulking of thrombus. There is a small amount of residual thrombus in the truncus anterior and right middle lobe pulmonary artery. Markedly improved parenchymal opacification in the right upper and lower lobes. Persistently decreased opacification in the right middle lobe pulmonary artery. At this time, patient remained hemodynamically stable. Main pulmonary artery pressure was again checked and found to be significantly improved at 48/10 (23) mm Hg. This appears to be a good clinical endpoint. Therefore, the procedure was terminated and the catheters and sheath removed. Hemostasis was attained with the assistance of an 0 silk pursestring suture at the right common femoral entry site. FINDINGS: Initial main pulmonary arterial pressure: 50/16 (29) mm Hg Post thrombectomy main pulmonary arterial pressure: 40/10 (23) mm Hg IMPRESSION: 1. Large volume bilateral pulmonary emboli with secondary pulmonary arterial hypertension. 2. Successful extirpation of greater than 90% of the left-sided thrombus, and approximately 70% of the right-sided thrombus using mechanical thrombectomy. Electronically Signed   By: Jacqulynn Cadet M.D.   On: 05/21/2020 14:18   IR Angiogram Selective Each Additional Vessel  Result Date:  05/21/2020 INDICATION: 70 year old female 5 days status post L4 laminectomy and decompressive spinal surgery now with high intermediate risk acute PE with right heart strain. Given recent surgical intervention on the lumbar spine, she is a poor candidate for catheter directed thrombolysis. Therefore, we plan to proceed with catheter directed mechanical thrombectomy. EXAM: 1. Ultrasound-guided puncture right common femoral vein 2. Right iliac and inferior vena cavagram 3. Catheterization of the main pulmonary artery with pulmonary arteriogram and pulmonary artery pressure measurements 4. Catheterization of the left lower lobe pulmonary artery with arteriogram 5. Mechanical thrombectomy with extirpation of clot from the left main and left lower lobe pulmonary arteries 6. Catheterization of the right pulmonary artery with arteriogram 7. Mechanical thrombectomy with extirpation of clot from the right lower lobe and right main pulmonary arteries COMPARISON:  CTA chest 05/20/2020 MEDICATIONS: None. ANESTHESIA/SEDATION: Deep sedation and hemodynamic support provided by anesthesiology. FLUOROSCOPY TIME:  Fluoroscopy Time: 25 minutes 24 seconds (187 mGy). COMPLICATIONS: None immediate. TECHNIQUE: Informed written consent was obtained from the patient after a thorough discussion of the procedural risks, benefits and alternatives. All questions were addressed. Maximal Sterile Barrier Technique was utilized including caps, mask, sterile gowns, sterile gloves, sterile drape, hand hygiene and skin antiseptic. A  timeout was performed prior to the initiation of the procedure. The right common femoral vein was interrogated with ultrasound and found to be widely patent. An image was obtained and stored for the medical record. Local anesthesia was attained by infiltration with 1% lidocaine. A small dermatotomy was made. Under real-time sonographic guidance, the vessel was punctured with a 21 gauge micropuncture needle. Using standard  technique, the initial micro needle was exchanged over a 0.018 micro wire for a transitional 4 [place] micro sheath. The micro sheath was then exchanged over a 0.035 wire for a 14 French fascial dilator and the soft tissue tract was dilated. Soft tissue tract was then serially dilated to 20 [place]. A 24 French dry seal sheath was then advanced over the wire and positioned in the inferior vena cava. A 6 French angled pigtail catheter was then advanced through the tricuspid valve and up into the main pulmonary outflow tract. An initial pulmonary arteriogram was performed confirming the catheters position in the main pulmonary artery. As the injection was performed, the catheter flipped into the left main pulmonary artery. Extensive bilateral pulmonary emboli are visualized. Pressures were then obtained. Main PA pressure 50/16 (29) mm Hg The pigtail catheter was then exchanged over an Amplatz wire for a 100 cm angled catheter. The catheter and wire combination were then advanced into the left lower lobe pulmonary artery. The 24 [place] FlowTriever thrombectomy catheter was then advanced over the wire and into the left main pulmonary artery. Additional arteriography was performed demonstrating large volume thrombus within the left main, left upper and left lower lobe pulmonary arteries. The 61 French pigtail thrombectomy catheter was then advanced coaxially over the wire and into the left lower lobe pulmonary artery. Mechanical thrombectomy was then performed throughout the left lower and left main pulmonary artery. Extensive mixed acute and chronic thrombus was successfully extra patent from the pulmonary arterial tree. Follow-up left pulmonary arteriography demonstrates near complete removal of thrombus with excellent opacification of the pulmonary arterial tree and broad parenchymal opacification. Minimal wedge-shaped defect persists peripherally. The 20 French thrombectomy catheter was removed. The 24 French  catheter was brought back into the main pulmonary outflow tract. The angled catheter was reintroduced coaxially and used to select the right main pulmonary artery over a Bentson wire. Right sided pulmonary arteriography was performed. Extensive clot burden in the right main pulmonary artery extending into the truncus anterior and right lower lobe pulmonary arteries. The superstiff Amplatz wire was successfully advanced into the right lower lobe pulmonary artery. The 24 French thrombectomy catheter was advanced over the wire. Suction thrombectomy was again performed. Successful extirpation of extensive thrombus from the right lower lobe pulmonary artery and truncus anterior branch artery. Follow-up pulmonary arteriography demonstrates significant debulking of thrombus. There is a small amount of residual thrombus in the truncus anterior and right middle lobe pulmonary artery. Markedly improved parenchymal opacification in the right upper and lower lobes. Persistently decreased opacification in the right middle lobe pulmonary artery. At this time, patient remained hemodynamically stable. Main pulmonary artery pressure was again checked and found to be significantly improved at 48/10 (23) mm Hg. This appears to be a good clinical endpoint. Therefore, the procedure was terminated and the catheters and sheath removed. Hemostasis was attained with the assistance of an 0 silk pursestring suture at the right common femoral entry site. FINDINGS: Initial main pulmonary arterial pressure: 50/16 (29) mm Hg Post thrombectomy main pulmonary arterial pressure: 40/10 (23) mm Hg IMPRESSION: 1. Large volume bilateral pulmonary  emboli with secondary pulmonary arterial hypertension. 2. Successful extirpation of greater than 90% of the left-sided thrombus, and approximately 70% of the right-sided thrombus using mechanical thrombectomy. Electronically Signed   By: Jacqulynn Cadet M.D.   On: 05/21/2020 14:18   IR Venocavagram  Svc  Result Date: 05/21/2020 INDICATION: 70 year old female 5 days status post L4 laminectomy and decompressive spinal surgery now with high intermediate risk acute PE with right heart strain. Given recent surgical intervention on the lumbar spine, she is a poor candidate for catheter directed thrombolysis. Therefore, we plan to proceed with catheter directed mechanical thrombectomy. EXAM: 1. Ultrasound-guided puncture right common femoral vein 2. Right iliac and inferior vena cavagram 3. Catheterization of the main pulmonary artery with pulmonary arteriogram and pulmonary artery pressure measurements 4. Catheterization of the left lower lobe pulmonary artery with arteriogram 5. Mechanical thrombectomy with extirpation of clot from the left main and left lower lobe pulmonary arteries 6. Catheterization of the right pulmonary artery with arteriogram 7. Mechanical thrombectomy with extirpation of clot from the right lower lobe and right main pulmonary arteries COMPARISON:  CTA chest 05/20/2020 MEDICATIONS: None. ANESTHESIA/SEDATION: Deep sedation and hemodynamic support provided by anesthesiology. FLUOROSCOPY TIME:  Fluoroscopy Time: 25 minutes 24 seconds (187 mGy). COMPLICATIONS: None immediate. TECHNIQUE: Informed written consent was obtained from the patient after a thorough discussion of the procedural risks, benefits and alternatives. All questions were addressed. Maximal Sterile Barrier Technique was utilized including caps, mask, sterile gowns, sterile gloves, sterile drape, hand hygiene and skin antiseptic. A timeout was performed prior to the initiation of the procedure. The right common femoral vein was interrogated with ultrasound and found to be widely patent. An image was obtained and stored for the medical record. Local anesthesia was attained by infiltration with 1% lidocaine. A small dermatotomy was made. Under real-time sonographic guidance, the vessel was punctured with a 21 gauge micropuncture  needle. Using standard technique, the initial micro needle was exchanged over a 0.018 micro wire for a transitional 4 [place] micro sheath. The micro sheath was then exchanged over a 0.035 wire for a 14 French fascial dilator and the soft tissue tract was dilated. Soft tissue tract was then serially dilated to 20 [place]. A 24 French dry seal sheath was then advanced over the wire and positioned in the inferior vena cava. A 6 French angled pigtail catheter was then advanced through the tricuspid valve and up into the main pulmonary outflow tract. An initial pulmonary arteriogram was performed confirming the catheters position in the main pulmonary artery. As the injection was performed, the catheter flipped into the left main pulmonary artery. Extensive bilateral pulmonary emboli are visualized. Pressures were then obtained. Main PA pressure 50/16 (29) mm Hg The pigtail catheter was then exchanged over an Amplatz wire for a 100 cm angled catheter. The catheter and wire combination were then advanced into the left lower lobe pulmonary artery. The 24 [place] FlowTriever thrombectomy catheter was then advanced over the wire and into the left main pulmonary artery. Additional arteriography was performed demonstrating large volume thrombus within the left main, left upper and left lower lobe pulmonary arteries. The 41 French pigtail thrombectomy catheter was then advanced coaxially over the wire and into the left lower lobe pulmonary artery. Mechanical thrombectomy was then performed throughout the left lower and left main pulmonary artery. Extensive mixed acute and chronic thrombus was successfully extra patent from the pulmonary arterial tree. Follow-up left pulmonary arteriography demonstrates near complete removal of thrombus with excellent opacification of  the pulmonary arterial tree and broad parenchymal opacification. Minimal wedge-shaped defect persists peripherally. The 20 French thrombectomy catheter was  removed. The 24 French catheter was brought back into the main pulmonary outflow tract. The angled catheter was reintroduced coaxially and used to select the right main pulmonary artery over a Bentson wire. Right sided pulmonary arteriography was performed. Extensive clot burden in the right main pulmonary artery extending into the truncus anterior and right lower lobe pulmonary arteries. The superstiff Amplatz wire was successfully advanced into the right lower lobe pulmonary artery. The 24 French thrombectomy catheter was advanced over the wire. Suction thrombectomy was again performed. Successful extirpation of extensive thrombus from the right lower lobe pulmonary artery and truncus anterior branch artery. Follow-up pulmonary arteriography demonstrates significant debulking of thrombus. There is a small amount of residual thrombus in the truncus anterior and right middle lobe pulmonary artery. Markedly improved parenchymal opacification in the right upper and lower lobes. Persistently decreased opacification in the right middle lobe pulmonary artery. At this time, patient remained hemodynamically stable. Main pulmonary artery pressure was again checked and found to be significantly improved at 48/10 (23) mm Hg. This appears to be a good clinical endpoint. Therefore, the procedure was terminated and the catheters and sheath removed. Hemostasis was attained with the assistance of an 0 silk pursestring suture at the right common femoral entry site. FINDINGS: Initial main pulmonary arterial pressure: 50/16 (29) mm Hg Post thrombectomy main pulmonary arterial pressure: 40/10 (23) mm Hg IMPRESSION: 1. Large volume bilateral pulmonary emboli with secondary pulmonary arterial hypertension. 2. Successful extirpation of greater than 90% of the left-sided thrombus, and approximately 70% of the right-sided thrombus using mechanical thrombectomy. Electronically Signed   By: Jacqulynn Cadet M.D.   On: 05/21/2020 14:18    IR THROMBECT PRIM MECH INIT (INCLU) MOD SED  Result Date: 05/21/2020 INDICATION: 70 year old female 5 days status post L4 laminectomy and decompressive spinal surgery now with high intermediate risk acute PE with right heart strain. Given recent surgical intervention on the lumbar spine, she is a poor candidate for catheter directed thrombolysis. Therefore, we plan to proceed with catheter directed mechanical thrombectomy. EXAM: 1. Ultrasound-guided puncture right common femoral vein 2. Right iliac and inferior vena cavagram 3. Catheterization of the main pulmonary artery with pulmonary arteriogram and pulmonary artery pressure measurements 4. Catheterization of the left lower lobe pulmonary artery with arteriogram 5. Mechanical thrombectomy with extirpation of clot from the left main and left lower lobe pulmonary arteries 6. Catheterization of the right pulmonary artery with arteriogram 7. Mechanical thrombectomy with extirpation of clot from the right lower lobe and right main pulmonary arteries COMPARISON:  CTA chest 05/20/2020 MEDICATIONS: None. ANESTHESIA/SEDATION: Deep sedation and hemodynamic support provided by anesthesiology. FLUOROSCOPY TIME:  Fluoroscopy Time: 25 minutes 24 seconds (187 mGy). COMPLICATIONS: None immediate. TECHNIQUE: Informed written consent was obtained from the patient after a thorough discussion of the procedural risks, benefits and alternatives. All questions were addressed. Maximal Sterile Barrier Technique was utilized including caps, mask, sterile gowns, sterile gloves, sterile drape, hand hygiene and skin antiseptic. A timeout was performed prior to the initiation of the procedure. The right common femoral vein was interrogated with ultrasound and found to be widely patent. An image was obtained and stored for the medical record. Local anesthesia was attained by infiltration with 1% lidocaine. A small dermatotomy was made. Under real-time sonographic guidance, the vessel was  punctured with a 21 gauge micropuncture needle. Using standard technique, the initial micro needle was exchanged  over a 0.018 micro wire for a transitional 4 [place] micro sheath. The micro sheath was then exchanged over a 0.035 wire for a 14 French fascial dilator and the soft tissue tract was dilated. Soft tissue tract was then serially dilated to 20 [place]. A 24 French dry seal sheath was then advanced over the wire and positioned in the inferior vena cava. A 6 French angled pigtail catheter was then advanced through the tricuspid valve and up into the main pulmonary outflow tract. An initial pulmonary arteriogram was performed confirming the catheters position in the main pulmonary artery. As the injection was performed, the catheter flipped into the left main pulmonary artery. Extensive bilateral pulmonary emboli are visualized. Pressures were then obtained. Main PA pressure 50/16 (29) mm Hg The pigtail catheter was then exchanged over an Amplatz wire for a 100 cm angled catheter. The catheter and wire combination were then advanced into the left lower lobe pulmonary artery. The 24 [place] FlowTriever thrombectomy catheter was then advanced over the wire and into the left main pulmonary artery. Additional arteriography was performed demonstrating large volume thrombus within the left main, left upper and left lower lobe pulmonary arteries. The 78 French pigtail thrombectomy catheter was then advanced coaxially over the wire and into the left lower lobe pulmonary artery. Mechanical thrombectomy was then performed throughout the left lower and left main pulmonary artery. Extensive mixed acute and chronic thrombus was successfully extra patent from the pulmonary arterial tree. Follow-up left pulmonary arteriography demonstrates near complete removal of thrombus with excellent opacification of the pulmonary arterial tree and broad parenchymal opacification. Minimal wedge-shaped defect persists peripherally. The  20 French thrombectomy catheter was removed. The 24 French catheter was brought back into the main pulmonary outflow tract. The angled catheter was reintroduced coaxially and used to select the right main pulmonary artery over a Bentson wire. Right sided pulmonary arteriography was performed. Extensive clot burden in the right main pulmonary artery extending into the truncus anterior and right lower lobe pulmonary arteries. The superstiff Amplatz wire was successfully advanced into the right lower lobe pulmonary artery. The 24 French thrombectomy catheter was advanced over the wire. Suction thrombectomy was again performed. Successful extirpation of extensive thrombus from the right lower lobe pulmonary artery and truncus anterior branch artery. Follow-up pulmonary arteriography demonstrates significant debulking of thrombus. There is a small amount of residual thrombus in the truncus anterior and right middle lobe pulmonary artery. Markedly improved parenchymal opacification in the right upper and lower lobes. Persistently decreased opacification in the right middle lobe pulmonary artery. At this time, patient remained hemodynamically stable. Main pulmonary artery pressure was again checked and found to be significantly improved at 48/10 (23) mm Hg. This appears to be a good clinical endpoint. Therefore, the procedure was terminated and the catheters and sheath removed. Hemostasis was attained with the assistance of an 0 silk pursestring suture at the right common femoral entry site. FINDINGS: Initial main pulmonary arterial pressure: 50/16 (29) mm Hg Post thrombectomy main pulmonary arterial pressure: 40/10 (23) mm Hg IMPRESSION: 1. Large volume bilateral pulmonary emboli with secondary pulmonary arterial hypertension. 2. Successful extirpation of greater than 90% of the left-sided thrombus, and approximately 70% of the right-sided thrombus using mechanical thrombectomy. Electronically Signed   By: Jacqulynn Cadet M.D.   On: 05/21/2020 14:18   IR THROMBECT PRIM MECH INIT (INCLU) MOD SED  Result Date: 05/21/2020 INDICATION: 70 year old female 5 days status post L4 laminectomy and decompressive spinal surgery now with high intermediate  risk acute PE with right heart strain. Given recent surgical intervention on the lumbar spine, she is a poor candidate for catheter directed thrombolysis. Therefore, we plan to proceed with catheter directed mechanical thrombectomy. EXAM: 1. Ultrasound-guided puncture right common femoral vein 2. Right iliac and inferior vena cavagram 3. Catheterization of the main pulmonary artery with pulmonary arteriogram and pulmonary artery pressure measurements 4. Catheterization of the left lower lobe pulmonary artery with arteriogram 5. Mechanical thrombectomy with extirpation of clot from the left main and left lower lobe pulmonary arteries 6. Catheterization of the right pulmonary artery with arteriogram 7. Mechanical thrombectomy with extirpation of clot from the right lower lobe and right main pulmonary arteries COMPARISON:  CTA chest 05/20/2020 MEDICATIONS: None. ANESTHESIA/SEDATION: Deep sedation and hemodynamic support provided by anesthesiology. FLUOROSCOPY TIME:  Fluoroscopy Time: 25 minutes 24 seconds (187 mGy). COMPLICATIONS: None immediate. TECHNIQUE: Informed written consent was obtained from the patient after a thorough discussion of the procedural risks, benefits and alternatives. All questions were addressed. Maximal Sterile Barrier Technique was utilized including caps, mask, sterile gowns, sterile gloves, sterile drape, hand hygiene and skin antiseptic. A timeout was performed prior to the initiation of the procedure. The right common femoral vein was interrogated with ultrasound and found to be widely patent. An image was obtained and stored for the medical record. Local anesthesia was attained by infiltration with 1% lidocaine. A small dermatotomy was made. Under  real-time sonographic guidance, the vessel was punctured with a 21 gauge micropuncture needle. Using standard technique, the initial micro needle was exchanged over a 0.018 micro wire for a transitional 4 [place] micro sheath. The micro sheath was then exchanged over a 0.035 wire for a 14 French fascial dilator and the soft tissue tract was dilated. Soft tissue tract was then serially dilated to 20 [place]. A 24 French dry seal sheath was then advanced over the wire and positioned in the inferior vena cava. A 6 French angled pigtail catheter was then advanced through the tricuspid valve and up into the main pulmonary outflow tract. An initial pulmonary arteriogram was performed confirming the catheters position in the main pulmonary artery. As the injection was performed, the catheter flipped into the left main pulmonary artery. Extensive bilateral pulmonary emboli are visualized. Pressures were then obtained. Main PA pressure 50/16 (29) mm Hg The pigtail catheter was then exchanged over an Amplatz wire for a 100 cm angled catheter. The catheter and wire combination were then advanced into the left lower lobe pulmonary artery. The 24 [place] FlowTriever thrombectomy catheter was then advanced over the wire and into the left main pulmonary artery. Additional arteriography was performed demonstrating large volume thrombus within the left main, left upper and left lower lobe pulmonary arteries. The 22 French pigtail thrombectomy catheter was then advanced coaxially over the wire and into the left lower lobe pulmonary artery. Mechanical thrombectomy was then performed throughout the left lower and left main pulmonary artery. Extensive mixed acute and chronic thrombus was successfully extra patent from the pulmonary arterial tree. Follow-up left pulmonary arteriography demonstrates near complete removal of thrombus with excellent opacification of the pulmonary arterial tree and broad parenchymal opacification. Minimal  wedge-shaped defect persists peripherally. The 20 French thrombectomy catheter was removed. The 24 French catheter was brought back into the main pulmonary outflow tract. The angled catheter was reintroduced coaxially and used to select the right main pulmonary artery over a Bentson wire. Right sided pulmonary arteriography was performed. Extensive clot burden in the right main pulmonary artery  extending into the truncus anterior and right lower lobe pulmonary arteries. The superstiff Amplatz wire was successfully advanced into the right lower lobe pulmonary artery. The 24 French thrombectomy catheter was advanced over the wire. Suction thrombectomy was again performed. Successful extirpation of extensive thrombus from the right lower lobe pulmonary artery and truncus anterior branch artery. Follow-up pulmonary arteriography demonstrates significant debulking of thrombus. There is a small amount of residual thrombus in the truncus anterior and right middle lobe pulmonary artery. Markedly improved parenchymal opacification in the right upper and lower lobes. Persistently decreased opacification in the right middle lobe pulmonary artery. At this time, patient remained hemodynamically stable. Main pulmonary artery pressure was again checked and found to be significantly improved at 48/10 (23) mm Hg. This appears to be a good clinical endpoint. Therefore, the procedure was terminated and the catheters and sheath removed. Hemostasis was attained with the assistance of an 0 silk pursestring suture at the right common femoral entry site. FINDINGS: Initial main pulmonary arterial pressure: 50/16 (29) mm Hg Post thrombectomy main pulmonary arterial pressure: 40/10 (23) mm Hg IMPRESSION: 1. Large volume bilateral pulmonary emboli with secondary pulmonary arterial hypertension. 2. Successful extirpation of greater than 90% of the left-sided thrombus, and approximately 70% of the right-sided thrombus using mechanical  thrombectomy. Electronically Signed   By: Jacqulynn Cadet M.D.   On: 05/21/2020 14:18   IR US Guide Vasc Access Right  Result Date: 05/21/2020 INDICATION: 70 year old female 5 days status post L4 laminectomy and decompressive spinal surgery now with high intermediate risk acute PE with right heart strain. Given recent surgical intervention on the lumbar spine, she is a poor candidate for catheter directed thrombolysis. Therefore, we plan to proceed with catheter directed mechanical thrombectomy. EXAM: 1. Ultrasound-guided puncture right common femoral vein 2. Right iliac and inferior vena cavagram 3. Catheterization of the main pulmonary artery with pulmonary arteriogram and pulmonary artery pressure measurements 4. Catheterization of the left lower lobe pulmonary artery with arteriogram 5. Mechanical thrombectomy with extirpation of clot from the left main and left lower lobe pulmonary arteries 6. Catheterization of the right pulmonary artery with arteriogram 7. Mechanical thrombectomy with extirpation of clot from the right lower lobe and right main pulmonary arteries COMPARISON:  CTA chest 05/20/2020 MEDICATIONS: None. ANESTHESIA/SEDATION: Deep sedation and hemodynamic support provided by anesthesiology. FLUOROSCOPY TIME:  Fluoroscopy Time: 25 minutes 24 seconds (187 mGy). COMPLICATIONS: None immediate. TECHNIQUE: Informed written consent was obtained from the patient after a thorough discussion of the procedural risks, benefits and alternatives. All questions were addressed. Maximal Sterile Barrier Technique was utilized including caps, mask, sterile gowns, sterile gloves, sterile drape, hand hygiene and skin antiseptic. A timeout was performed prior to the initiation of the procedure. The right common femoral vein was interrogated with ultrasound and found to be widely patent. An image was obtained and stored for the medical record. Local anesthesia was attained by infiltration with 1% lidocaine. A small  dermatotomy was made. Under real-time sonographic guidance, the vessel was punctured with a 21 gauge micropuncture needle. Using standard technique, the initial micro needle was exchanged over a 0.018 micro wire for a transitional 4 [place] micro sheath. The micro sheath was then exchanged over a 0.035 wire for a 14 French fascial dilator and the soft tissue tract was dilated. Soft tissue tract was then serially dilated to 20 [place]. A 24 French dry seal sheath was then advanced over the wire and positioned in the inferior vena cava. A 6 French angled pigtail catheter  was then advanced through the tricuspid valve and up into the main pulmonary outflow tract. An initial pulmonary arteriogram was performed confirming the catheters position in the main pulmonary artery. As the injection was performed, the catheter flipped into the left main pulmonary artery. Extensive bilateral pulmonary emboli are visualized. Pressures were then obtained. Main PA pressure 50/16 (29) mm Hg The pigtail catheter was then exchanged over an Amplatz wire for a 100 cm angled catheter. The catheter and wire combination were then advanced into the left lower lobe pulmonary artery. The 24 [place] FlowTriever thrombectomy catheter was then advanced over the wire and into the left main pulmonary artery. Additional arteriography was performed demonstrating large volume thrombus within the left main, left upper and left lower lobe pulmonary arteries. The 77 French pigtail thrombectomy catheter was then advanced coaxially over the wire and into the left lower lobe pulmonary artery. Mechanical thrombectomy was then performed throughout the left lower and left main pulmonary artery. Extensive mixed acute and chronic thrombus was successfully extra patent from the pulmonary arterial tree. Follow-up left pulmonary arteriography demonstrates near complete removal of thrombus with excellent opacification of the pulmonary arterial tree and broad  parenchymal opacification. Minimal wedge-shaped defect persists peripherally. The 20 French thrombectomy catheter was removed. The 24 French catheter was brought back into the main pulmonary outflow tract. The angled catheter was reintroduced coaxially and used to select the right main pulmonary artery over a Bentson wire. Right sided pulmonary arteriography was performed. Extensive clot burden in the right main pulmonary artery extending into the truncus anterior and right lower lobe pulmonary arteries. The superstiff Amplatz wire was successfully advanced into the right lower lobe pulmonary artery. The 24 French thrombectomy catheter was advanced over the wire. Suction thrombectomy was again performed. Successful extirpation of extensive thrombus from the right lower lobe pulmonary artery and truncus anterior branch artery. Follow-up pulmonary arteriography demonstrates significant debulking of thrombus. There is a small amount of residual thrombus in the truncus anterior and right middle lobe pulmonary artery. Markedly improved parenchymal opacification in the right upper and lower lobes. Persistently decreased opacification in the right middle lobe pulmonary artery. At this time, patient remained hemodynamically stable. Main pulmonary artery pressure was again checked and found to be significantly improved at 48/10 (23) mm Hg. This appears to be a good clinical endpoint. Therefore, the procedure was terminated and the catheters and sheath removed. Hemostasis was attained with the assistance of an 0 silk pursestring suture at the right common femoral entry site. FINDINGS: Initial main pulmonary arterial pressure: 50/16 (29) mm Hg Post thrombectomy main pulmonary arterial pressure: 40/10 (23) mm Hg IMPRESSION: 1. Large volume bilateral pulmonary emboli with secondary pulmonary arterial hypertension. 2. Successful extirpation of greater than 90% of the left-sided thrombus, and approximately 70% of the right-sided  thrombus using mechanical thrombectomy. Electronically Signed   By: Jacqulynn Cadet M.D.   On: 05/21/2020 14:18     Flora Lipps, MD  [facility] 05/22/2020  If 7PM-7AM, please contact night-coverage

## 2020-05-23 DIAGNOSIS — Z419 Encounter for procedure for purposes other than remedying health state, unspecified: Secondary | ICD-10-CM

## 2020-05-23 DIAGNOSIS — Z09 Encounter for follow-up examination after completed treatment for conditions other than malignant neoplasm: Secondary | ICD-10-CM

## 2020-05-23 DIAGNOSIS — R0602 Shortness of breath: Secondary | ICD-10-CM

## 2020-05-23 LAB — CBC
HCT: 26 % — ABNORMAL LOW (ref 36.0–46.0)
Hemoglobin: 7.8 g/dL — ABNORMAL LOW (ref 12.0–15.0)
MCH: 28.2 pg (ref 26.0–34.0)
MCHC: 30 g/dL (ref 30.0–36.0)
MCV: 93.9 fL (ref 80.0–100.0)
Platelets: 205 10*3/uL (ref 150–400)
RBC: 2.77 MIL/uL — ABNORMAL LOW (ref 3.87–5.11)
RDW: 14.8 % (ref 11.5–15.5)
WBC: 10.9 10*3/uL — ABNORMAL HIGH (ref 4.0–10.5)
nRBC: 0.2 % (ref 0.0–0.2)

## 2020-05-23 LAB — HEPARIN LEVEL (UNFRACTIONATED): Heparin Unfractionated: 0.23 IU/mL — ABNORMAL LOW (ref 0.30–0.70)

## 2020-05-23 MED ORDER — APIXABAN (ELIQUIS) VTE STARTER PACK (10MG AND 5MG): ORAL_TABLET | ORAL | 5 refills | Status: DC

## 2020-05-23 MED ORDER — POLYETHYLENE GLYCOL 3350 17 G PO PACK: 17.0000 g | PACK | Freq: Every day | ORAL | 0 refills | Status: DC | PRN

## 2020-05-23 MED ORDER — APIXABAN 5 MG PO TABS
10.0000 mg | ORAL_TABLET | Freq: Two times a day (BID) | ORAL | Status: DC
  Administered 2020-05-23: 10 mg via ORAL
  Filled 2020-05-23: qty 2

## 2020-05-23 MED ORDER — APIXABAN 5 MG PO TABS: 5.0000 mg | ORAL_TABLET | Freq: Two times a day (BID) | ORAL | Status: DC

## 2020-05-23 NOTE — TOC Transition Note (Signed)
Transition of Care [facility]) - CM/SW Discharge Note   Patient Details  Name: Karen Ramirez MRN: [id number] Date of Birth: 1950-01-24  [facility] of [facility] CM/SW Contact:  Geralynn Ochs, LCSW Phone Number: 05/23/2020, 12:54 PM   Clinical Narrative:   Nurse to call report to [phone], Room 115    Final next level of care: Skilled Nursing Facility Barriers to Discharge: Barriers Resolved   Patient Goals and CMS Choice   CMS Medicare.gov Compare Post Acute Care list provided to:: Patient Choice offered to / list presented to : Patient,Spouse  Discharge Placement              Patient [facility] bed at: [facility] Patient to be transferred to facility by: [place] Name of family member notified: Self Patient and family notified of of transfer: 05/23/20  Discharge Plan and Services In-house Referral: Clinical Social Work Discharge Planning Services: CM Consult Post Acute Care Choice: [place]                               Social Determinants of Health (SDOH) Interventions     Readmission Risk Interventions No flowsheet data found.

## 2020-05-23 NOTE — Discharge Summary (Signed)
Physician Discharge Summary  Karen Ramirez UXN:[id number] DOB: November 08, 1950 DOA: 05/14/2020  PCP: Adin Hector, MD  Admit date: 05/14/2020 Discharge date: 05/23/2020  Admitted From:  SNF  Discharge disposition:SNF  Recommendations for Outpatient Follow-Up:   . Follow up with your primary care provider at the skilled nursing facility in 3 to 5 days. . Check CBC, BMP, magnesium in the next visit . Follow-up with Dr. Ellene Route neurosurgery in 1 to 2 weeks or as per neurosurgical office.Notified neurosurgery team   Discharge Diagnosis:   Principal Problem:   Fatigue fracture of vertebra, lumbar region, sequela of fracture Active Problems:   Lumbar stenosis with neurogenic claudication   Pressure injury of skin   Acute pulmonary embolism ([place])   Discharge Condition: Improved.  Diet recommendation: Low sodium, heart healthy.    Wound care: None.  Code status: Full.  History of Present Illness:   Karen Ramirez a 70 y.o.femalewith medical history significant ofhypothyroidism, hyperlipidemia,nephrolithiasis, PMR, and lumbar stenosis with herniated disks presented to hospital with continued back pain.  Patient had history of surgical decompression and fusion of L1 L4 with Dr. Ellene Route on 04/05/2020.  Following surgery patient was significantly debilitated and was transferred to skilled nursing facility for rehab but had continued to feel pain radiating down her legs.  During her postoperative visit she was noted to have L4 fracture with lost fixation.  She was then admitted back to the hospital on 514 and underwent revision surgery to compress L4 and perform fixation to the pelvis.  On postoperative day 4, patient's hemoglobin drifted down so medical team was consulted.  She was also needing some oxygen which prompted her medical attention   Hospital Course:   Following conditions were addressed during hospitalization as listed below,  Acute respiratory failure with  hypoxia:  Patient was noted to be hypoxic during hospitalization and required up to 4 L of oxygen.  VQ scan was initially performed due to lack of contrast but showed multiple defects.  DVT scan was positive for DVT in the right lower extremity as well, so patient was put on Lovenox.  Subsequently, it was changed to heparin drip after CT angiogram showed pulmonary embolism.   Acute pulmonary embolism from high probability VQ scan, right lower extremity DVT.  2D echocardiogram performed on 05/20/2020 showed severely reduced RV function with enlarged right ventricle and elevated pulmonary artery pressure at 64 consistent with acute pulmonary RV strain and severe pulmonary hypertension.   CT angiogram was done and the recommendation was to proceed with mechanical thrombectomy.  Patient was initiated on heparin drip and underwent mechanical thrombectomy on 05/21/2020.  Post thrombectomy patient has remained stable.    Neurosurgery is okay with anticoagulation options.    Patient has been started on oral Eliquis and will be continued on discharge  Status post revision spine surgery.  Management as per neurosurgical team.  Will need to follow-up with neurosurgery as outpatient.  Elevated BNP and troponins secondary to RV strain and pulmonary embolism.  Acute blood loss anemia likely postoperative.  Hemoglobin as low as 7.3.  Received packed RBC transfusion with  hemoglobin at 9.7>9.3 >7.8>7.8.   No mention of GI bleed in the past or any external bleeding currently.    Has been transitioned to oral anticoagulants.  Acute urinary retention.    Foley catheter.  Was able to void by herself  Essential hypertension: On HCTZ will be continued   Hypothyroidism Continue Synthroid.  Hypokalemia.    Mild.  Replenished.  Disposition.  At this time, patient is stable for disposition to skilled nursing facility.  Spoke with the patient husband at bedside.  Medical Consultants:    Neuro  surgery  IR  Procedures:     Revision spine surgery by neurosurgery  Mechanical thrombectomy on 05/21/2020   Subjective:   Today, patient was seen and examined at bedside.  Discharge Exam:   Vitals:   05/23/20 0417 05/23/20 0755  BP: (!) 102/56 114/61  Pulse: 71 69  Resp: 17 18  Temp: 97.8 F (36.6 C) 97.6 F (36.4 C)  SpO2: 98% 99%   Vitals:   05/22/20 2025 05/23/20 0002 05/23/20 0417 05/23/20 0755  BP: (!) 126/56 104/68 (!) 102/56 114/61  Pulse: 79 73 71 69  Resp: 18 18 17 18   Temp: 98.1 F (36.7 C) 97.8 F (36.6 C) 97.8 F (36.6 C) 97.6 F (36.4 C)  TempSrc: Oral Oral Oral Oral  SpO2: 95% 98% 98% 99%  Weight:      Height:        General: Alert awake, not in obvious distress, obese HENT: pupils equally reacting to light,  No scleral pallor or icterus noted. Oral mucosa is moist.  Chest:  Clear breath sounds.  Diminished breath sounds bilaterally. No crackles or wheezes.  CVS: S1 &S2 heard. No murmur.  Regular rate and rhythm. Abdomen: Soft, nontender, nondistended.  Bowel sounds are heard.   Extremities: No cyanosis, clubbing with mild tenderness of the left leg and mild edema of the right lower extremity..  Peripheral pulses are palpable. Psych: Alert, awake and oriented, normal mood CNS:  No cranial nerve deficits.  Power equal in all extremities.   Skin: Warm and dry.  No rashes noted.  The results of significant diagnostics from this hospitalization (including imaging, microbiology, ancillary and laboratory) are listed below for reference.     Diagnostic Studies:   No results found.   Labs:   Basic Metabolic Panel: Recent Labs  Lab 05/22/20 0018  NA 135  K 3.3*  CL 102  CO2 25  GLUCOSE 110*  BUN 25*  CREATININE 0.73  CALCIUM 7.6*  MG 2.1  PHOS 3.9   GFR Estimated Creatinine Clearance: 65.9 mL/min (by C-G formula based on SCr of 0.73 mg/dL). Liver Function Tests: Recent Labs  Lab 05/22/20 0018  AST 24  ALT 46*  ALKPHOS 111   BILITOT 0.5  PROT 4.6*  ALBUMIN 2.5*   No results for input(s): LIPASE, AMYLASE in the last 168 hours. No results for input(s): AMMONIA in the last 168 hours. Coagulation profile No results for input(s): INR, PROTIME in the last 168 hours.  CBC: Recent Labs  Lab 05/18/20 0344 05/18/20 2230 05/19/20 1509 05/19/20 2052 05/21/20 0440 05/22/20 0018 05/23/20 0440  WBC 9.3 9.2  --   --  9.7 11.0* 10.9*  HGB 7.3* 9.1* 10.6* 9.7* 9.3* 7.8* 7.8*  HCT 23.6* 29.1* 33.8* 30.8* 30.4* 24.7* 26.0*  MCV 93.7 91.8  --   --  92.4 92.9 93.9  PLT 206 197  --   --  213 162 205   Cardiac Enzymes: No results for input(s): CKTOTAL, CKMB, CKMBINDEX, TROPONINI in the last 168 hours. BNP: Invalid input(s): POCBNP CBG: No results for input(s): GLUCAP in the last 168 hours. D-Dimer No results for input(s): DDIMER in the last 72 hours. Hgb A1c No results for input(s): HGBA1C in the last 72 hours. Lipid Profile No results for input(s): CHOL, HDL, LDLCALC, TRIG, CHOLHDL, LDLDIRECT in the last  72 hours. Thyroid function studies No results for input(s): TSH, T4TOTAL, T3FREE, THYROIDAB in the last 72 hours.  Invalid input(s): FREET3 Anemia work up No results for input(s): VITAMINB12, FOLATE, FERRITIN, TIBC, IRON, RETICCTPCT in the last 72 hours. Microbiology Recent Results (from the past 240 hour(s))  SARS CORONAVIRUS 2 (TAT 6-24 HRS) Nasopharyngeal Nasopharyngeal Swab     Status: None   Collection Time: 05/18/20  1:45 PM   Specimen: Nasopharyngeal Swab  Result Value Ref Range Status   SARS Coronavirus 2 NEGATIVE NEGATIVE Final    Comment: (NOTE) SARS-CoV-2 target nucleic acids are NOT DETECTED.  The SARS-CoV-2 RNA is generally detectable in upper and lower respiratory specimens during the acute phase of infection. Negative results do not preclude SARS-CoV-2 infection, do not rule out co-infections with other pathogens, and should not be used as the sole basis for treatment or other patient  management decisions. Negative results must be combined with clinical observations, patient history, and epidemiological information. The expected result is Negative.  Fact Sheet for Patients: SugarRoll.be  Fact Sheet for Healthcare Providers: https://www.woods-mathews.com/  This test is not yet approved or cleared by the [place] FDA and  has been authorized for detection and/or diagnosis of SARS-CoV-2 by FDA under an Emergency Use Authorization (EUA). This EUA will remain  in effect (meaning this test can be used) for the duration of the COVID-19 declaration under Se ction 564(b)(1) of the Act, 21 U.S.C. section 360bbb-3(b)(1), unless the authorization is terminated or revoked sooner.  Performed at [facility], [place] [address] [place], [place] [address]   Surgical pcr screen     Status: None   Collection Time: 05/21/20  2:00 AM   Specimen: Nasal Mucosa; Nasal Swab  Result Value Ref Range Status   MRSA, PCR NEGATIVE NEGATIVE Final   Staphylococcus aureus NEGATIVE NEGATIVE Final    Comment: (NOTE) The Xpert SA Assay (FDA approved for NASAL specimens in patients 54 years of age and older), is one component of a comprehensive surveillance program. It is not intended to diagnose infection nor to guide or monitor treatment. Performed at [facility], [place] [address], [place], [place] [address]   SARS CORONAVIRUS 2 (TAT 6-24 HRS) Nasopharyngeal Nasopharyngeal Swab     Status: None   Collection Time: 05/22/20  2:55 PM   Specimen: Nasopharyngeal Swab  Result Value Ref Range Status   SARS Coronavirus 2 NEGATIVE NEGATIVE Final    Comment: (NOTE) SARS-CoV-2 target nucleic acids are NOT DETECTED.  The SARS-CoV-2 RNA is generally detectable in upper and lower respiratory specimens during the acute phase of infection. Negative results do not preclude SARS-CoV-2 infection, do not rule out co-infections with other pathogens, and  should not be used as the sole basis for treatment or other patient management decisions. Negative results must be combined with clinical observations, patient history, and epidemiological information. The expected result is Negative.  Fact Sheet for Patients: SugarRoll.be  Fact Sheet for Healthcare Providers: https://www.woods-mathews.com/  This test is not yet approved or cleared by the [place] FDA and  has been authorized for detection and/or diagnosis of SARS-CoV-2 by FDA under an Emergency Use Authorization (EUA). This EUA will remain  in effect (meaning this test can be used) for the duration of the COVID-19 declaration under Se ction 564(b)(1) of the Act, 21 U.S.C. section 360bbb-3(b)(1), unless the authorization is terminated or revoked sooner.  Performed at [facility], [place] [address] [place], [place] [address]  Discharge Instructions:   Discharge Instructions    Call MD for:  redness, tenderness, or signs of infection (pain, swelling, redness, odor or green/yellow discharge around incision site)   Complete by: As directed    Call MD for:  severe uncontrolled pain   Complete by: As directed    Call MD for:  temperature >100.4   Complete by: As directed    Diet - low sodium heart healthy   Complete by: As directed    Incentive spirometry RT   Complete by: As directed    Increase activity slowly   Complete by: As directed    No dressing needed   Complete by: As directed      Allergies as of 05/23/2020      Reactions   Azithromycin Diarrhea   Levaquin [levofloxacin] Nausea And Vomiting   Ultram [tramadol] Nausea And Vomiting      Medication List    STOP taking these medications   celecoxib 200 MG capsule Commonly known as: CELEBREX     TAKE these medications   Apixaban Starter Pack (10mg  and 5mg ) Commonly known as: ELIQUIS STARTER PACK Take as directed on package: start with two-5mg  tablets  twice daily for 7 days. On day 8, switch to one-5mg  tablet twice daily.   carboxymethylcellulose 0.5 % Soln Commonly known as: REFRESH PLUS Place 1 drop into both eyes at bedtime.   dexamethasone 1 MG tablet Commonly known as: DECADRON 2 tablets twice daily for 2 days, one tablet twice daily for 2 days, one tablet daily for 2 days.   diazepam 5 MG tablet Commonly known as: VALIUM Take 0.5 tablets (2.5 mg total) by mouth at bedtime.   famotidine 20 MG tablet Commonly known as: PEPCID Take 20 mg by mouth 2 (two) times daily before a meal.   folic acid 1 MG tablet Commonly known as: FOLVITE Take 2 mg by mouth daily.   hydrochlorothiazide 12.5 MG tablet Commonly known as: HYDRODIURIL Take 12.5 mg by mouth daily.   HYDROcodone-acetaminophen 5-325 MG tablet Commonly known as: NORCO/VICODIN Take 1-2 tablets by mouth every 4 (four) hours as needed for moderate pain.   levothyroxine 75 MCG tablet Commonly known as: SYNTHROID Take 75 mcg by mouth daily before breakfast.   methocarbamol 500 MG tablet Commonly known as: ROBAXIN Take 1 tablet (500 mg total) by mouth every 6 (six) hours as needed for muscle spasms.   polyethylene glycol 17 g packet Commonly known as: MIRALAX / GLYCOLAX Take 17 g by mouth daily as needed for mild constipation.   predniSONE 10 MG tablet Commonly known as: DELTASONE Take 10 mg by mouth in the morning.   pregabalin 75 MG capsule Commonly known as: LYRICA Take 1 capsule (75 mg total) by mouth 3 (three) times daily.   simvastatin 20 MG tablet Commonly known as: ZOCOR Take 20 mg by mouth at bedtime.   triamcinolone 55 MCG/ACT Aero nasal inhaler Commonly known as: NASACORT Place 1 spray into the nose at bedtime.   Vitamin D3 125 MCG (5000 UT) Tabs Take 5,000 Units by mouth every other day.            Discharge Care Instructions  (From admission, onward)         Start     Ordered   05/19/20 0000  No dressing needed        05/19/20  [id number]          Contact information for follow-up providers    Tama High  III, MD .   Specialty: Internal Medicine Contact information: [place] [address] [phone]            Contact information for after-discharge care    Destination    [facility] .   Service: Skilled Nursing Contact information: [place] [phone]                   Time coordinating discharge: 39 minutes  Signed:  Bera Pinela  [facility] 05/23/2020, 10:09 AM

## 2020-05-23 NOTE — Progress Notes (Signed)
Referring Physician(s): Dr Birdena Jubilee  Supervising Physician: Marliss Coots  Patient Status:  [facility] - In-pt  Chief Complaint:  B PE; severe Rt heart strain   Subjective:  5/21 procedure:   1.) Bilateral pulmonary angiography and extirpation of pulmonary emboli with mechanical thrombectomy   Pt is up in bed and breathing easy She is feeling lots better daily   Allergies: Azithromycin, Levaquin [levofloxacin], and Ultram [tramadol]  Medications: Prior to Admission medications   Medication Sig Start Date End Date Taking? Authorizing Provider  carboxymethylcellulose (REFRESH PLUS) 0.5 % SOLN Place 1 drop into both eyes at bedtime.   Yes [provider]  celecoxib (CELEBREX) 200 MG capsule Take 200 mg by mouth in the morning and at bedtime. 05/10/20  Yes [provider]  Cholecalciferol (VITAMIN D3) 125 MCG (5000 UT) TABS Take 5,000 Units by mouth every other day.   Yes [provider]  dexamethasone (DECADRON) 1 MG tablet 2 tablets twice daily for 2 days, one tablet twice daily for 2 days, one tablet daily for 2 days. 05/19/20  Yes Barnett Abu, MD  famotidine (PEPCID) 20 MG tablet Take 20 mg by mouth 2 (two) times daily before a meal.   Yes [provider]  folic acid (FOLVITE) 1 MG tablet Take 2 mg by mouth daily. 03/24/20  Yes [provider]  hydrochlorothiazide (HYDRODIURIL) 12.5 MG tablet Take 12.5 mg by mouth daily. 01/12/20  Yes [provider]  levothyroxine (SYNTHROID, LEVOTHROID) 75 MCG tablet Take 75 mcg by mouth daily before breakfast.   Yes [provider]  predniSONE (DELTASONE) 10 MG tablet Take 10 mg by mouth in the morning.   Yes [provider]  simvastatin (ZOCOR) 20 MG tablet Take 20 mg by mouth at bedtime.    Yes [provider]  triamcinolone (NASACORT) 55 MCG/ACT AERO nasal inhaler Place 1 spray into the nose at bedtime.   Yes [provider]  diazepam (VALIUM) 5 MG  tablet Take 0.5 tablets (2.5 mg total) by mouth at bedtime. 05/19/20   Barnett Abu, MD  HYDROcodone-acetaminophen (NORCO/VICODIN) 5-325 MG tablet Take 1-2 tablets by mouth every 4 (four) hours as needed for moderate pain. 05/19/20   Barnett Abu, MD  methocarbamol (ROBAXIN) 500 MG tablet Take 1 tablet (500 mg total) by mouth every 6 (six) hours as needed for muscle spasms. 04/08/20   Dawley, Troy C, DO  methocarbamol (ROBAXIN) 500 MG tablet Take 1 tablet (500 mg total) by mouth every 6 (six) hours as needed for muscle spasms. 05/19/20   Barnett Abu, MD  pregabalin (LYRICA) 75 MG capsule Take 1 capsule (75 mg total) by mouth 3 (three) times daily. 05/19/20   Barnett Abu, MD     Vital Signs: BP 114/61 (BP Location: Right Arm)   Pulse 69   Temp 97.6 F (36.4 C) (Oral)   Resp 18   Ht 5' 2.52" (1.588 m)   Wt 182 lb 1.6 oz (82.6 kg)   SpO2 99%   BMI 32.75 kg/m   Physical Exam Vitals reviewed.  Pulmonary:     Effort: Pulmonary effort is normal.     Breath sounds: Normal breath sounds.  Skin:    General: Skin is warm.     Comments: Right groin site is clean and dry No bleeding \\no  hematoma Rt foot 2+ pulses  Neurological:     Mental Status: She is alert and oriented to person, place, and time.     Imaging: CT ANGIO CHEST  PE W OR WO CONTRAST  Result Date: 05/20/2020 CLINICAL DATA:  70 year old female with recent positive V/Q scan for pulmonary embolism. EXAM: CT ANGIOGRAPHY CHEST WITH CONTRAST TECHNIQUE: Multidetector CT imaging of the chest was performed using the standard protocol during bolus administration of intravenous contrast. Multiplanar CT image reconstructions and MIPs were obtained to evaluate the vascular anatomy. CONTRAST:  Fifty mL Omnipaque 350, intravenous COMPARISON:  05/19/2020, 10/09/2018 FINDINGS: Cardiovascular: Satisfactory opacification of the pulmonary arteries to the segmental level. Acute bilateral pulmonary emboli extending from the distal main bilateral  pulmonary arteries to the upper and lower subsegmental branches, mostly nonocclusive throughout. The right to left ventricular ratio is approximately 1.4. There is mild prominence of the main pulmonary artery measuring up to 31 mm. There is reflux of contrast into the perihepatic IVC and central hepatic veins. No pericardial effusion. Mediastinum/Nodes: No enlarged mediastinal, hilar, or axillary lymph nodes. Thyroid gland, trachea, and esophagus demonstrate no significant findings. Lungs/Pleura: Diffuse mosaic attenuation pattern. Scattered interlobular septal thickening. Mild bibasilar subsegmental atelectasis. No evidence of pulmonary infarction. No significant pleural effusion or evidence of pneumothorax. No suspicious pulmonary nodules. Upper Abdomen: Diffusely decreased attenuation of the hepatic parenchyma. The remaining visualized upper abdomen is otherwise within normal limits. Musculoskeletal: Mild multilevel degenerative changes of the thoracic spine. No acute osseous abnormality or aggressive appearing osseous lesion. Review of the MIP images confirms the above findings. IMPRESSION: Vascular: Acute bilateral pulmonary emboli, symmetrically extending from the distal main pulmonary arteries to the subsegmental branches in the upper and lower lobes. There is CT evidence of right heart strain. Non-Vascular: 1. Mild pulmonary edema.  No evidence of pulmonary infarction. 2. Hepatic steatosis. These results were relayed via Epic secure chat at the time of interpretation on 05/20/2020 at 4:22 pm to provider [facility], who acknowledged these results. Ruthann Cancer, MD [facility] Electronically Signed   By: Ruthann Cancer MD   On: 05/20/2020 16:25   NM Pulmonary Perfusion  Result Date: 05/19/2020 CLINICAL DATA:  Shortness of breath and positive D-dimer EXAM: NUCLEAR MEDICINE PERFUSION LUNG SCAN TECHNIQUE: Perfusion images were obtained in multiple  projections after intravenous injection of radiopharmaceutical. Ventilation scans intentionally deferred if perfusion scan and chest x-ray adequate for interpretation during COVID 19 epidemic. RADIOPHARMACEUTICALS:  Four mCi Tc-68m MAA IV COMPARISON:  Chest from earlier in the same day. FINDINGS: Multiple wedge like defects are identified bilaterally as well as near absence of perfusion in the right lung base. These changes would be consistent with multifocal pulmonary emboli. IMPRESSION: Multiple defects bilaterally with a normal appearing chest x-ray consistent with pulmonary embolism. Electronically Signed   By: Inez Catalina M.D.   On: 05/19/2020 15:52   IR Angiogram Pulmonary Bilateral Selective  Result Date: 05/21/2020 INDICATION: 69 year old female 5 days status post L4 laminectomy and decompressive spinal surgery now with high intermediate risk acute PE with right heart strain. Given recent surgical intervention on the lumbar spine, she is a poor candidate for catheter directed thrombolysis. Therefore, we plan to proceed with catheter directed mechanical thrombectomy. EXAM: 1. Ultrasound-guided puncture right common femoral vein 2. Right iliac and inferior vena cavagram 3. Catheterization of the main pulmonary artery with pulmonary arteriogram and pulmonary artery pressure measurements 4. Catheterization of the left lower lobe pulmonary artery with arteriogram 5. Mechanical thrombectomy with extirpation of clot from the left main and left lower lobe pulmonary arteries 6. Catheterization of the right pulmonary artery with arteriogram 7. Mechanical thrombectomy with extirpation of clot from  the right lower lobe and right main pulmonary arteries COMPARISON:  CTA chest 05/20/2020 MEDICATIONS: None. ANESTHESIA/SEDATION: Deep sedation and hemodynamic support provided by anesthesiology. FLUOROSCOPY TIME:  Fluoroscopy Time: 25 minutes 24 seconds (187 mGy). COMPLICATIONS: None immediate. TECHNIQUE: Informed  written consent was obtained from the patient after a thorough discussion of the procedural risks, benefits and alternatives. All questions were addressed. Maximal Sterile Barrier Technique was utilized including caps, mask, sterile gowns, sterile gloves, sterile drape, hand hygiene and skin antiseptic. A timeout was performed prior to the initiation of the procedure. The right common femoral vein was interrogated with ultrasound and found to be widely patent. An image was obtained and stored for the medical record. Local anesthesia was attained by infiltration with 1% lidocaine. A small dermatotomy was made. Under real-time sonographic guidance, the vessel was punctured with a 21 gauge micropuncture needle. Using standard technique, the initial micro needle was exchanged over a 0.018 micro wire for a transitional 4 [place] micro sheath. The micro sheath was then exchanged over a 0.035 wire for a 14 French fascial dilator and the soft tissue tract was dilated. Soft tissue tract was then serially dilated to 20 [place]. A 24 French dry seal sheath was then advanced over the wire and positioned in the inferior vena cava. A 6 French angled pigtail catheter was then advanced through the tricuspid valve and up into the main pulmonary outflow tract. An initial pulmonary arteriogram was performed confirming the catheters position in the main pulmonary artery. As the injection was performed, the catheter flipped into the left main pulmonary artery. Extensive bilateral pulmonary emboli are visualized. Pressures were then obtained. Main PA pressure 50/16 (29) mm Hg The pigtail catheter was then exchanged over an Amplatz wire for a 100 cm angled catheter. The catheter and wire combination were then advanced into the left lower lobe pulmonary artery. The 24 [place] FlowTriever thrombectomy catheter was then advanced over the wire and into the left main pulmonary artery. Additional arteriography was performed demonstrating  large volume thrombus within the left main, left upper and left lower lobe pulmonary arteries. The 31 French pigtail thrombectomy catheter was then advanced coaxially over the wire and into the left lower lobe pulmonary artery. Mechanical thrombectomy was then performed throughout the left lower and left main pulmonary artery. Extensive mixed acute and chronic thrombus was successfully extra patent from the pulmonary arterial tree. Follow-up left pulmonary arteriography demonstrates near complete removal of thrombus with excellent opacification of the pulmonary arterial tree and broad parenchymal opacification. Minimal wedge-shaped defect persists peripherally. The 20 French thrombectomy catheter was removed. The 24 French catheter was brought back into the main pulmonary outflow tract. The angled catheter was reintroduced coaxially and used to select the right main pulmonary artery over a Bentson wire. Right sided pulmonary arteriography was performed. Extensive clot burden in the right main pulmonary artery extending into the truncus anterior and right lower lobe pulmonary arteries. The superstiff Amplatz wire was successfully advanced into the right lower lobe pulmonary artery. The 24 French thrombectomy catheter was advanced over the wire. Suction thrombectomy was again performed. Successful extirpation of extensive thrombus from the right lower lobe pulmonary artery and truncus anterior branch artery. Follow-up pulmonary arteriography demonstrates significant debulking of thrombus. There is a small amount of residual thrombus in the truncus anterior and right middle lobe pulmonary artery. Markedly improved parenchymal opacification in the right upper and lower lobes. Persistently decreased opacification in the right middle lobe pulmonary artery. At this time, patient remained  hemodynamically stable. Main pulmonary artery pressure was again checked and found to be significantly improved at 48/10 (23) mm Hg. This  appears to be a good clinical endpoint. Therefore, the procedure was terminated and the catheters and sheath removed. Hemostasis was attained with the assistance of an 0 silk pursestring suture at the right common femoral entry site. FINDINGS: Initial main pulmonary arterial pressure: 50/16 (29) mm Hg Post thrombectomy main pulmonary arterial pressure: 40/10 (23) mm Hg IMPRESSION: 1. Large volume bilateral pulmonary emboli with secondary pulmonary arterial hypertension. 2. Successful extirpation of greater than 90% of the left-sided thrombus, and approximately 70% of the right-sided thrombus using mechanical thrombectomy. Electronically Signed   By: Jacqulynn Cadet M.D.   On: 05/21/2020 14:18   IR Angiogram Selective Each Additional Vessel  Result Date: 05/21/2020 INDICATION: 70 year old female 5 days status post L4 laminectomy and decompressive spinal surgery now with high intermediate risk acute PE with right heart strain. Given recent surgical intervention on the lumbar spine, she is a poor candidate for catheter directed thrombolysis. Therefore, we plan to proceed with catheter directed mechanical thrombectomy. EXAM: 1. Ultrasound-guided puncture right common femoral vein 2. Right iliac and inferior vena cavagram 3. Catheterization of the main pulmonary artery with pulmonary arteriogram and pulmonary artery pressure measurements 4. Catheterization of the left lower lobe pulmonary artery with arteriogram 5. Mechanical thrombectomy with extirpation of clot from the left main and left lower lobe pulmonary arteries 6. Catheterization of the right pulmonary artery with arteriogram 7. Mechanical thrombectomy with extirpation of clot from the right lower lobe and right main pulmonary arteries COMPARISON:  CTA chest 05/20/2020 MEDICATIONS: None. ANESTHESIA/SEDATION: Deep sedation and hemodynamic support provided by anesthesiology. FLUOROSCOPY TIME:  Fluoroscopy Time: 25 minutes 24 seconds (187 mGy). COMPLICATIONS:  None immediate. TECHNIQUE: Informed written consent was obtained from the patient after a thorough discussion of the procedural risks, benefits and alternatives. All questions were addressed. Maximal Sterile Barrier Technique was utilized including caps, mask, sterile gowns, sterile gloves, sterile drape, hand hygiene and skin antiseptic. A timeout was performed prior to the initiation of the procedure. The right common femoral vein was interrogated with ultrasound and found to be widely patent. An image was obtained and stored for the medical record. Local anesthesia was attained by infiltration with 1% lidocaine. A small dermatotomy was made. Under real-time sonographic guidance, the vessel was punctured with a 21 gauge micropuncture needle. Using standard technique, the initial micro needle was exchanged over a 0.018 micro wire for a transitional 4 [place] micro sheath. The micro sheath was then exchanged over a 0.035 wire for a 14 French fascial dilator and the soft tissue tract was dilated. Soft tissue tract was then serially dilated to 20 [place]. A 24 French dry seal sheath was then advanced over the wire and positioned in the inferior vena cava. A 6 French angled pigtail catheter was then advanced through the tricuspid valve and up into the main pulmonary outflow tract. An initial pulmonary arteriogram was performed confirming the catheters position in the main pulmonary artery. As the injection was performed, the catheter flipped into the left main pulmonary artery. Extensive bilateral pulmonary emboli are visualized. Pressures were then obtained. Main PA pressure 50/16 (29) mm Hg The pigtail catheter was then exchanged over an Amplatz wire for a 100 cm angled catheter. The catheter and wire combination were then advanced into the left lower lobe pulmonary artery. The 24 [place] FlowTriever thrombectomy catheter was then advanced over the wire and into the left  main pulmonary artery. Additional  arteriography was performed demonstrating large volume thrombus within the left main, left upper and left lower lobe pulmonary arteries. The 59 French pigtail thrombectomy catheter was then advanced coaxially over the wire and into the left lower lobe pulmonary artery. Mechanical thrombectomy was then performed throughout the left lower and left main pulmonary artery. Extensive mixed acute and chronic thrombus was successfully extra patent from the pulmonary arterial tree. Follow-up left pulmonary arteriography demonstrates near complete removal of thrombus with excellent opacification of the pulmonary arterial tree and broad parenchymal opacification. Minimal wedge-shaped defect persists peripherally. The 20 French thrombectomy catheter was removed. The 24 French catheter was brought back into the main pulmonary outflow tract. The angled catheter was reintroduced coaxially and used to select the right main pulmonary artery over a Bentson wire. Right sided pulmonary arteriography was performed. Extensive clot burden in the right main pulmonary artery extending into the truncus anterior and right lower lobe pulmonary arteries. The superstiff Amplatz wire was successfully advanced into the right lower lobe pulmonary artery. The 24 French thrombectomy catheter was advanced over the wire. Suction thrombectomy was again performed. Successful extirpation of extensive thrombus from the right lower lobe pulmonary artery and truncus anterior branch artery. Follow-up pulmonary arteriography demonstrates significant debulking of thrombus. There is a small amount of residual thrombus in the truncus anterior and right middle lobe pulmonary artery. Markedly improved parenchymal opacification in the right upper and lower lobes. Persistently decreased opacification in the right middle lobe pulmonary artery. At this time, patient remained hemodynamically stable. Main pulmonary artery pressure was again checked and found to be  significantly improved at 48/10 (23) mm Hg. This appears to be a good clinical endpoint. Therefore, the procedure was terminated and the catheters and sheath removed. Hemostasis was attained with the assistance of an 0 silk pursestring suture at the right common femoral entry site. FINDINGS: Initial main pulmonary arterial pressure: 50/16 (29) mm Hg Post thrombectomy main pulmonary arterial pressure: 40/10 (23) mm Hg IMPRESSION: 1. Large volume bilateral pulmonary emboli with secondary pulmonary arterial hypertension. 2. Successful extirpation of greater than 90% of the left-sided thrombus, and approximately 70% of the right-sided thrombus using mechanical thrombectomy. Electronically Signed   By: Jacqulynn Cadet M.D.   On: 05/21/2020 14:18   IR Angiogram Selective Each Additional Vessel  Result Date: 05/21/2020 INDICATION: 70 year old female 5 days status post L4 laminectomy and decompressive spinal surgery now with high intermediate risk acute PE with right heart strain. Given recent surgical intervention on the lumbar spine, she is a poor candidate for catheter directed thrombolysis. Therefore, we plan to proceed with catheter directed mechanical thrombectomy. EXAM: 1. Ultrasound-guided puncture right common femoral vein 2. Right iliac and inferior vena cavagram 3. Catheterization of the main pulmonary artery with pulmonary arteriogram and pulmonary artery pressure measurements 4. Catheterization of the left lower lobe pulmonary artery with arteriogram 5. Mechanical thrombectomy with extirpation of clot from the left main and left lower lobe pulmonary arteries 6. Catheterization of the right pulmonary artery with arteriogram 7. Mechanical thrombectomy with extirpation of clot from the right lower lobe and right main pulmonary arteries COMPARISON:  CTA chest 05/20/2020 MEDICATIONS: None. ANESTHESIA/SEDATION: Deep sedation and hemodynamic support provided by anesthesiology. FLUOROSCOPY TIME:  Fluoroscopy  Time: 25 minutes 24 seconds (187 mGy). COMPLICATIONS: None immediate. TECHNIQUE: Informed written consent was obtained from the patient after a thorough discussion of the procedural risks, benefits and alternatives. All questions were addressed. Maximal Sterile Barrier Technique was utilized including caps,  mask, sterile gowns, sterile gloves, sterile drape, hand hygiene and skin antiseptic. A timeout was performed prior to the initiation of the procedure. The right common femoral vein was interrogated with ultrasound and found to be widely patent. An image was obtained and stored for the medical record. Local anesthesia was attained by infiltration with 1% lidocaine. A small dermatotomy was made. Under real-time sonographic guidance, the vessel was punctured with a 21 gauge micropuncture needle. Using standard technique, the initial micro needle was exchanged over a 0.018 micro wire for a transitional 4 [place] micro sheath. The micro sheath was then exchanged over a 0.035 wire for a 14 French fascial dilator and the soft tissue tract was dilated. Soft tissue tract was then serially dilated to 20 [place]. A 24 French dry seal sheath was then advanced over the wire and positioned in the inferior vena cava. A 6 French angled pigtail catheter was then advanced through the tricuspid valve and up into the main pulmonary outflow tract. An initial pulmonary arteriogram was performed confirming the catheters position in the main pulmonary artery. As the injection was performed, the catheter flipped into the left main pulmonary artery. Extensive bilateral pulmonary emboli are visualized. Pressures were then obtained. Main PA pressure 50/16 (29) mm Hg The pigtail catheter was then exchanged over an Amplatz wire for a 100 cm angled catheter. The catheter and wire combination were then advanced into the left lower lobe pulmonary artery. The 24 [place] FlowTriever thrombectomy catheter was then advanced over the wire and  into the left main pulmonary artery. Additional arteriography was performed demonstrating large volume thrombus within the left main, left upper and left lower lobe pulmonary arteries. The 30 French pigtail thrombectomy catheter was then advanced coaxially over the wire and into the left lower lobe pulmonary artery. Mechanical thrombectomy was then performed throughout the left lower and left main pulmonary artery. Extensive mixed acute and chronic thrombus was successfully extra patent from the pulmonary arterial tree. Follow-up left pulmonary arteriography demonstrates near complete removal of thrombus with excellent opacification of the pulmonary arterial tree and broad parenchymal opacification. Minimal wedge-shaped defect persists peripherally. The 20 French thrombectomy catheter was removed. The 24 French catheter was brought back into the main pulmonary outflow tract. The angled catheter was reintroduced coaxially and used to select the right main pulmonary artery over a Bentson wire. Right sided pulmonary arteriography was performed. Extensive clot burden in the right main pulmonary artery extending into the truncus anterior and right lower lobe pulmonary arteries. The superstiff Amplatz wire was successfully advanced into the right lower lobe pulmonary artery. The 24 French thrombectomy catheter was advanced over the wire. Suction thrombectomy was again performed. Successful extirpation of extensive thrombus from the right lower lobe pulmonary artery and truncus anterior branch artery. Follow-up pulmonary arteriography demonstrates significant debulking of thrombus. There is a small amount of residual thrombus in the truncus anterior and right middle lobe pulmonary artery. Markedly improved parenchymal opacification in the right upper and lower lobes. Persistently decreased opacification in the right middle lobe pulmonary artery. At this time, patient remained hemodynamically stable. Main pulmonary artery  pressure was again checked and found to be significantly improved at 48/10 (23) mm Hg. This appears to be a good clinical endpoint. Therefore, the procedure was terminated and the catheters and sheath removed. Hemostasis was attained with the assistance of an 0 silk pursestring suture at the right common femoral entry site. FINDINGS: Initial main pulmonary arterial pressure: 50/16 (29) mm Hg Post thrombectomy main  pulmonary arterial pressure: 40/10 (23) mm Hg IMPRESSION: 1. Large volume bilateral pulmonary emboli with secondary pulmonary arterial hypertension. 2. Successful extirpation of greater than 90% of the left-sided thrombus, and approximately 70% of the right-sided thrombus using mechanical thrombectomy. Electronically Signed   By: Jacqulynn Cadet M.D.   On: 05/21/2020 14:18   IR Venocavagram Svc  Result Date: 05/21/2020 INDICATION: 70 year old female 5 days status post L4 laminectomy and decompressive spinal surgery now with high intermediate risk acute PE with right heart strain. Given recent surgical intervention on the lumbar spine, she is a poor candidate for catheter directed thrombolysis. Therefore, we plan to proceed with catheter directed mechanical thrombectomy. EXAM: 1. Ultrasound-guided puncture right common femoral vein 2. Right iliac and inferior vena cavagram 3. Catheterization of the main pulmonary artery with pulmonary arteriogram and pulmonary artery pressure measurements 4. Catheterization of the left lower lobe pulmonary artery with arteriogram 5. Mechanical thrombectomy with extirpation of clot from the left main and left lower lobe pulmonary arteries 6. Catheterization of the right pulmonary artery with arteriogram 7. Mechanical thrombectomy with extirpation of clot from the right lower lobe and right main pulmonary arteries COMPARISON:  CTA chest 05/20/2020 MEDICATIONS: None. ANESTHESIA/SEDATION: Deep sedation and hemodynamic support provided by anesthesiology. FLUOROSCOPY TIME:   Fluoroscopy Time: 25 minutes 24 seconds (187 mGy). COMPLICATIONS: None immediate. TECHNIQUE: Informed written consent was obtained from the patient after a thorough discussion of the procedural risks, benefits and alternatives. All questions were addressed. Maximal Sterile Barrier Technique was utilized including caps, mask, sterile gowns, sterile gloves, sterile drape, hand hygiene and skin antiseptic. A timeout was performed prior to the initiation of the procedure. The right common femoral vein was interrogated with ultrasound and found to be widely patent. An image was obtained and stored for the medical record. Local anesthesia was attained by infiltration with 1% lidocaine. A small dermatotomy was made. Under real-time sonographic guidance, the vessel was punctured with a 21 gauge micropuncture needle. Using standard technique, the initial micro needle was exchanged over a 0.018 micro wire for a transitional 4 [place] micro sheath. The micro sheath was then exchanged over a 0.035 wire for a 14 French fascial dilator and the soft tissue tract was dilated. Soft tissue tract was then serially dilated to 20 [place]. A 24 French dry seal sheath was then advanced over the wire and positioned in the inferior vena cava. A 6 French angled pigtail catheter was then advanced through the tricuspid valve and up into the main pulmonary outflow tract. An initial pulmonary arteriogram was performed confirming the catheters position in the main pulmonary artery. As the injection was performed, the catheter flipped into the left main pulmonary artery. Extensive bilateral pulmonary emboli are visualized. Pressures were then obtained. Main PA pressure 50/16 (29) mm Hg The pigtail catheter was then exchanged over an Amplatz wire for a 100 cm angled catheter. The catheter and wire combination were then advanced into the left lower lobe pulmonary artery. The 24 [place] FlowTriever thrombectomy catheter was then advanced over the  wire and into the left main pulmonary artery. Additional arteriography was performed demonstrating large volume thrombus within the left main, left upper and left lower lobe pulmonary arteries. The 74 French pigtail thrombectomy catheter was then advanced coaxially over the wire and into the left lower lobe pulmonary artery. Mechanical thrombectomy was then performed throughout the left lower and left main pulmonary artery. Extensive mixed acute and chronic thrombus was successfully extra patent from the pulmonary arterial tree. Follow-up  left pulmonary arteriography demonstrates near complete removal of thrombus with excellent opacification of the pulmonary arterial tree and broad parenchymal opacification. Minimal wedge-shaped defect persists peripherally. The 20 French thrombectomy catheter was removed. The 24 French catheter was brought back into the main pulmonary outflow tract. The angled catheter was reintroduced coaxially and used to select the right main pulmonary artery over a Bentson wire. Right sided pulmonary arteriography was performed. Extensive clot burden in the right main pulmonary artery extending into the truncus anterior and right lower lobe pulmonary arteries. The superstiff Amplatz wire was successfully advanced into the right lower lobe pulmonary artery. The 24 French thrombectomy catheter was advanced over the wire. Suction thrombectomy was again performed. Successful extirpation of extensive thrombus from the right lower lobe pulmonary artery and truncus anterior branch artery. Follow-up pulmonary arteriography demonstrates significant debulking of thrombus. There is a small amount of residual thrombus in the truncus anterior and right middle lobe pulmonary artery. Markedly improved parenchymal opacification in the right upper and lower lobes. Persistently decreased opacification in the right middle lobe pulmonary artery. At this time, patient remained hemodynamically stable. Main pulmonary  artery pressure was again checked and found to be significantly improved at 48/10 (23) mm Hg. This appears to be a good clinical endpoint. Therefore, the procedure was terminated and the catheters and sheath removed. Hemostasis was attained with the assistance of an 0 silk pursestring suture at the right common femoral entry site. FINDINGS: Initial main pulmonary arterial pressure: 50/16 (29) mm Hg Post thrombectomy main pulmonary arterial pressure: 40/10 (23) mm Hg IMPRESSION: 1. Large volume bilateral pulmonary emboli with secondary pulmonary arterial hypertension. 2. Successful extirpation of greater than 90% of the left-sided thrombus, and approximately 70% of the right-sided thrombus using mechanical thrombectomy. Electronically Signed   By: Jacqulynn Cadet M.D.   On: 05/21/2020 14:18   IR THROMBECT PRIM MECH INIT (INCLU) MOD SED  Result Date: 05/21/2020 INDICATION: 70 year old female 5 days status post L4 laminectomy and decompressive spinal surgery now with high intermediate risk acute PE with right heart strain. Given recent surgical intervention on the lumbar spine, she is a poor candidate for catheter directed thrombolysis. Therefore, we plan to proceed with catheter directed mechanical thrombectomy. EXAM: 1. Ultrasound-guided puncture right common femoral vein 2. Right iliac and inferior vena cavagram 3. Catheterization of the main pulmonary artery with pulmonary arteriogram and pulmonary artery pressure measurements 4. Catheterization of the left lower lobe pulmonary artery with arteriogram 5. Mechanical thrombectomy with extirpation of clot from the left main and left lower lobe pulmonary arteries 6. Catheterization of the right pulmonary artery with arteriogram 7. Mechanical thrombectomy with extirpation of clot from the right lower lobe and right main pulmonary arteries COMPARISON:  CTA chest 05/20/2020 MEDICATIONS: None. ANESTHESIA/SEDATION: Deep sedation and hemodynamic support provided by  anesthesiology. FLUOROSCOPY TIME:  Fluoroscopy Time: 25 minutes 24 seconds (187 mGy). COMPLICATIONS: None immediate. TECHNIQUE: Informed written consent was obtained from the patient after a thorough discussion of the procedural risks, benefits and alternatives. All questions were addressed. Maximal Sterile Barrier Technique was utilized including caps, mask, sterile gowns, sterile gloves, sterile drape, hand hygiene and skin antiseptic. A timeout was performed prior to the initiation of the procedure. The right common femoral vein was interrogated with ultrasound and found to be widely patent. An image was obtained and stored for the medical record. Local anesthesia was attained by infiltration with 1% lidocaine. A small dermatotomy was made. Under real-time sonographic guidance, the vessel was punctured with a 21  gauge micropuncture needle. Using standard technique, the initial micro needle was exchanged over a 0.018 micro wire for a transitional 4 [place] micro sheath. The micro sheath was then exchanged over a 0.035 wire for a 14 French fascial dilator and the soft tissue tract was dilated. Soft tissue tract was then serially dilated to 20 [place]. A 24 French dry seal sheath was then advanced over the wire and positioned in the inferior vena cava. A 6 French angled pigtail catheter was then advanced through the tricuspid valve and up into the main pulmonary outflow tract. An initial pulmonary arteriogram was performed confirming the catheters position in the main pulmonary artery. As the injection was performed, the catheter flipped into the left main pulmonary artery. Extensive bilateral pulmonary emboli are visualized. Pressures were then obtained. Main PA pressure 50/16 (29) mm Hg The pigtail catheter was then exchanged over an Amplatz wire for a 100 cm angled catheter. The catheter and wire combination were then advanced into the left lower lobe pulmonary artery. The 24 [place] FlowTriever thrombectomy  catheter was then advanced over the wire and into the left main pulmonary artery. Additional arteriography was performed demonstrating large volume thrombus within the left main, left upper and left lower lobe pulmonary arteries. The 56 French pigtail thrombectomy catheter was then advanced coaxially over the wire and into the left lower lobe pulmonary artery. Mechanical thrombectomy was then performed throughout the left lower and left main pulmonary artery. Extensive mixed acute and chronic thrombus was successfully extra patent from the pulmonary arterial tree. Follow-up left pulmonary arteriography demonstrates near complete removal of thrombus with excellent opacification of the pulmonary arterial tree and broad parenchymal opacification. Minimal wedge-shaped defect persists peripherally. The 20 French thrombectomy catheter was removed. The 24 French catheter was brought back into the main pulmonary outflow tract. The angled catheter was reintroduced coaxially and used to select the right main pulmonary artery over a Bentson wire. Right sided pulmonary arteriography was performed. Extensive clot burden in the right main pulmonary artery extending into the truncus anterior and right lower lobe pulmonary arteries. The superstiff Amplatz wire was successfully advanced into the right lower lobe pulmonary artery. The 24 French thrombectomy catheter was advanced over the wire. Suction thrombectomy was again performed. Successful extirpation of extensive thrombus from the right lower lobe pulmonary artery and truncus anterior branch artery. Follow-up pulmonary arteriography demonstrates significant debulking of thrombus. There is a small amount of residual thrombus in the truncus anterior and right middle lobe pulmonary artery. Markedly improved parenchymal opacification in the right upper and lower lobes. Persistently decreased opacification in the right middle lobe pulmonary artery. At this time, patient remained  hemodynamically stable. Main pulmonary artery pressure was again checked and found to be significantly improved at 48/10 (23) mm Hg. This appears to be a good clinical endpoint. Therefore, the procedure was terminated and the catheters and sheath removed. Hemostasis was attained with the assistance of an 0 silk pursestring suture at the right common femoral entry site. FINDINGS: Initial main pulmonary arterial pressure: 50/16 (29) mm Hg Post thrombectomy main pulmonary arterial pressure: 40/10 (23) mm Hg IMPRESSION: 1. Large volume bilateral pulmonary emboli with secondary pulmonary arterial hypertension. 2. Successful extirpation of greater than 90% of the left-sided thrombus, and approximately 70% of the right-sided thrombus using mechanical thrombectomy. Electronically Signed   By: Jacqulynn Cadet M.D.   On: 05/21/2020 14:18   IR THROMBECT PRIM MECH INIT (INCLU) MOD SED  Result Date: 05/21/2020 INDICATION: 70 year old female 5 days  status post L4 laminectomy and decompressive spinal surgery now with high intermediate risk acute PE with right heart strain. Given recent surgical intervention on the lumbar spine, she is a poor candidate for catheter directed thrombolysis. Therefore, we plan to proceed with catheter directed mechanical thrombectomy. EXAM: 1. Ultrasound-guided puncture right common femoral vein 2. Right iliac and inferior vena cavagram 3. Catheterization of the main pulmonary artery with pulmonary arteriogram and pulmonary artery pressure measurements 4. Catheterization of the left lower lobe pulmonary artery with arteriogram 5. Mechanical thrombectomy with extirpation of clot from the left main and left lower lobe pulmonary arteries 6. Catheterization of the right pulmonary artery with arteriogram 7. Mechanical thrombectomy with extirpation of clot from the right lower lobe and right main pulmonary arteries COMPARISON:  CTA chest 05/20/2020 MEDICATIONS: None. ANESTHESIA/SEDATION: Deep sedation  and hemodynamic support provided by anesthesiology. FLUOROSCOPY TIME:  Fluoroscopy Time: 25 minutes 24 seconds (187 mGy). COMPLICATIONS: None immediate. TECHNIQUE: Informed written consent was obtained from the patient after a thorough discussion of the procedural risks, benefits and alternatives. All questions were addressed. Maximal Sterile Barrier Technique was utilized including caps, mask, sterile gowns, sterile gloves, sterile drape, hand hygiene and skin antiseptic. A timeout was performed prior to the initiation of the procedure. The right common femoral vein was interrogated with ultrasound and found to be widely patent. An image was obtained and stored for the medical record. Local anesthesia was attained by infiltration with 1% lidocaine. A small dermatotomy was made. Under real-time sonographic guidance, the vessel was punctured with a 21 gauge micropuncture needle. Using standard technique, the initial micro needle was exchanged over a 0.018 micro wire for a transitional 4 [place] micro sheath. The micro sheath was then exchanged over a 0.035 wire for a 14 French fascial dilator and the soft tissue tract was dilated. Soft tissue tract was then serially dilated to 20 [place]. A 24 French dry seal sheath was then advanced over the wire and positioned in the inferior vena cava. A 6 French angled pigtail catheter was then advanced through the tricuspid valve and up into the main pulmonary outflow tract. An initial pulmonary arteriogram was performed confirming the catheters position in the main pulmonary artery. As the injection was performed, the catheter flipped into the left main pulmonary artery. Extensive bilateral pulmonary emboli are visualized. Pressures were then obtained. Main PA pressure 50/16 (29) mm Hg The pigtail catheter was then exchanged over an Amplatz wire for a 100 cm angled catheter. The catheter and wire combination were then advanced into the left lower lobe pulmonary artery. The 24  [place] FlowTriever thrombectomy catheter was then advanced over the wire and into the left main pulmonary artery. Additional arteriography was performed demonstrating large volume thrombus within the left main, left upper and left lower lobe pulmonary arteries. The 62 French pigtail thrombectomy catheter was then advanced coaxially over the wire and into the left lower lobe pulmonary artery. Mechanical thrombectomy was then performed throughout the left lower and left main pulmonary artery. Extensive mixed acute and chronic thrombus was successfully extra patent from the pulmonary arterial tree. Follow-up left pulmonary arteriography demonstrates near complete removal of thrombus with excellent opacification of the pulmonary arterial tree and broad parenchymal opacification. Minimal wedge-shaped defect persists peripherally. The 20 French thrombectomy catheter was removed. The 24 French catheter was brought back into the main pulmonary outflow tract. The angled catheter was reintroduced coaxially and used to select the right main pulmonary artery over a Bentson wire. Right sided pulmonary  arteriography was performed. Extensive clot burden in the right main pulmonary artery extending into the truncus anterior and right lower lobe pulmonary arteries. The superstiff Amplatz wire was successfully advanced into the right lower lobe pulmonary artery. The 24 French thrombectomy catheter was advanced over the wire. Suction thrombectomy was again performed. Successful extirpation of extensive thrombus from the right lower lobe pulmonary artery and truncus anterior branch artery. Follow-up pulmonary arteriography demonstrates significant debulking of thrombus. There is a small amount of residual thrombus in the truncus anterior and right middle lobe pulmonary artery. Markedly improved parenchymal opacification in the right upper and lower lobes. Persistently decreased opacification in the right middle lobe pulmonary  artery. At this time, patient remained hemodynamically stable. Main pulmonary artery pressure was again checked and found to be significantly improved at 48/10 (23) mm Hg. This appears to be a good clinical endpoint. Therefore, the procedure was terminated and the catheters and sheath removed. Hemostasis was attained with the assistance of an 0 silk pursestring suture at the right common femoral entry site. FINDINGS: Initial main pulmonary arterial pressure: 50/16 (29) mm Hg Post thrombectomy main pulmonary arterial pressure: 40/10 (23) mm Hg IMPRESSION: 1. Large volume bilateral pulmonary emboli with secondary pulmonary arterial hypertension. 2. Successful extirpation of greater than 90% of the left-sided thrombus, and approximately 70% of the right-sided thrombus using mechanical thrombectomy. Electronically Signed   By: Jacqulynn Cadet M.D.   On: 05/21/2020 14:18   IR US Guide Vasc Access Right  Result Date: 05/21/2020 INDICATION: 70 year old female 5 days status post L4 laminectomy and decompressive spinal surgery now with high intermediate risk acute PE with right heart strain. Given recent surgical intervention on the lumbar spine, she is a poor candidate for catheter directed thrombolysis. Therefore, we plan to proceed with catheter directed mechanical thrombectomy. EXAM: 1. Ultrasound-guided puncture right common femoral vein 2. Right iliac and inferior vena cavagram 3. Catheterization of the main pulmonary artery with pulmonary arteriogram and pulmonary artery pressure measurements 4. Catheterization of the left lower lobe pulmonary artery with arteriogram 5. Mechanical thrombectomy with extirpation of clot from the left main and left lower lobe pulmonary arteries 6. Catheterization of the right pulmonary artery with arteriogram 7. Mechanical thrombectomy with extirpation of clot from the right lower lobe and right main pulmonary arteries COMPARISON:  CTA chest 05/20/2020 MEDICATIONS: None.  ANESTHESIA/SEDATION: Deep sedation and hemodynamic support provided by anesthesiology. FLUOROSCOPY TIME:  Fluoroscopy Time: 25 minutes 24 seconds (187 mGy). COMPLICATIONS: None immediate. TECHNIQUE: Informed written consent was obtained from the patient after a thorough discussion of the procedural risks, benefits and alternatives. All questions were addressed. Maximal Sterile Barrier Technique was utilized including caps, mask, sterile gowns, sterile gloves, sterile drape, hand hygiene and skin antiseptic. A timeout was performed prior to the initiation of the procedure. The right common femoral vein was interrogated with ultrasound and found to be widely patent. An image was obtained and stored for the medical record. Local anesthesia was attained by infiltration with 1% lidocaine. A small dermatotomy was made. Under real-time sonographic guidance, the vessel was punctured with a 21 gauge micropuncture needle. Using standard technique, the initial micro needle was exchanged over a 0.018 micro wire for a transitional 4 [place] micro sheath. The micro sheath was then exchanged over a 0.035 wire for a 14 French fascial dilator and the soft tissue tract was dilated. Soft tissue tract was then serially dilated to 20 [place]. A 24 French dry seal sheath was then advanced over the wire and  positioned in the inferior vena cava. A 6 French angled pigtail catheter was then advanced through the tricuspid valve and up into the main pulmonary outflow tract. An initial pulmonary arteriogram was performed confirming the catheters position in the main pulmonary artery. As the injection was performed, the catheter flipped into the left main pulmonary artery. Extensive bilateral pulmonary emboli are visualized. Pressures were then obtained. Main PA pressure 50/16 (29) mm Hg The pigtail catheter was then exchanged over an Amplatz wire for a 100 cm angled catheter. The catheter and wire combination were then advanced into the left  lower lobe pulmonary artery. The 24 [place] FlowTriever thrombectomy catheter was then advanced over the wire and into the left main pulmonary artery. Additional arteriography was performed demonstrating large volume thrombus within the left main, left upper and left lower lobe pulmonary arteries. The 52 French pigtail thrombectomy catheter was then advanced coaxially over the wire and into the left lower lobe pulmonary artery. Mechanical thrombectomy was then performed throughout the left lower and left main pulmonary artery. Extensive mixed acute and chronic thrombus was successfully extra patent from the pulmonary arterial tree. Follow-up left pulmonary arteriography demonstrates near complete removal of thrombus with excellent opacification of the pulmonary arterial tree and broad parenchymal opacification. Minimal wedge-shaped defect persists peripherally. The 20 French thrombectomy catheter was removed. The 24 French catheter was brought back into the main pulmonary outflow tract. The angled catheter was reintroduced coaxially and used to select the right main pulmonary artery over a Bentson wire. Right sided pulmonary arteriography was performed. Extensive clot burden in the right main pulmonary artery extending into the truncus anterior and right lower lobe pulmonary arteries. The superstiff Amplatz wire was successfully advanced into the right lower lobe pulmonary artery. The 24 French thrombectomy catheter was advanced over the wire. Suction thrombectomy was again performed. Successful extirpation of extensive thrombus from the right lower lobe pulmonary artery and truncus anterior branch artery. Follow-up pulmonary arteriography demonstrates significant debulking of thrombus. There is a small amount of residual thrombus in the truncus anterior and right middle lobe pulmonary artery. Markedly improved parenchymal opacification in the right upper and lower lobes. Persistently decreased opacification in  the right middle lobe pulmonary artery. At this time, patient remained hemodynamically stable. Main pulmonary artery pressure was again checked and found to be significantly improved at 48/10 (23) mm Hg. This appears to be a good clinical endpoint. Therefore, the procedure was terminated and the catheters and sheath removed. Hemostasis was attained with the assistance of an 0 silk pursestring suture at the right common femoral entry site. FINDINGS: Initial main pulmonary arterial pressure: 50/16 (29) mm Hg Post thrombectomy main pulmonary arterial pressure: 40/10 (23) mm Hg IMPRESSION: 1. Large volume bilateral pulmonary emboli with secondary pulmonary arterial hypertension. 2. Successful extirpation of greater than 90% of the left-sided thrombus, and approximately 70% of the right-sided thrombus using mechanical thrombectomy. Electronically Signed   By: Jacqulynn Cadet M.D.   On: 05/21/2020 14:18   ECHOCARDIOGRAM COMPLETE  Result Date: 05/20/2020    ECHOCARDIOGRAM REPORT   Patient Name:   BRITT ALBACH Date of Exam: 05/20/2020 Medical Rec #:  [id number]          Height:       62.5 in Accession #:    [id number]         Weight:       182.1 lb Date of Birth:  [birth year]          BSA:  1.848 m Patient Age:    70 years           BP:           107/65 mmHg Patient Gender: F                  HR:           96 bpm. Exam Location:  Inpatient Procedure: 2D Echo, Cardiac Doppler and Color Doppler Indications:     CHF-Acute Diastolic  History:         Patient has no prior history of Echocardiogram examinations.                  Acute pulmonary embolus. Hx heart arrythmia. GERD.  Sonographer:     Clayton Lefort RDCS (AE) Referring Phys:  [id number] [facility] Diagnosing Phys: Gwyndolyn Kaufman MD IMPRESSIONS  1. Right ventricular systolic function is severely reduced. The right ventricular size is severely enlarged. There is severely elevated pulmonary artery systolic pressure. The estimated right ventricular  systolic pressure is 99.3 mmHg. Findings consistent with acute pulmonary with significant RV strain and severe pulmonary hypertension.  2. Left ventricular ejection fraction, by estimation, is 60 to 65%. The left ventricle has normal function. The left ventricle has no regional wall motion abnormalities. Left ventricular diastolic parameters are consistent with Grade I diastolic dysfunction (impaired relaxation). There is the interventricular septum is flattened in systole, consistent with right ventricular pressure overload.  3. The mitral valve is grossly normal. Trivial mitral valve regurgitation.  4. Tricuspid valve regurgitation is moderate.  5. The aortic valve is tricuspid. There is mild calcification of the aortic valve. There is moderate thickening of the aortic valve. Aortic valve regurgitation is not visualized. Mild to moderate aortic valve sclerosis/calcification is present, without any evidence of aortic stenosis.  6. The inferior vena cava is dilated in size with <50% respiratory variability, suggesting right atrial pressure of 15 mmHg. Comparison(s): No prior Echocardiogram. Conclusion(s)/Recommendation(s): Patient with severe RV dysfunction and pulmonary hypertension with acute PE. Consider IR evaluation if clinically indicated as may benefit from catheter directed thrombolysis if cleared from a surgical standpoint. FINDINGS  Left Ventricle: Left ventricular ejection fraction, by estimation, is 60 to 65%. The left ventricle has normal function. The left ventricle has no regional wall motion abnormalities. The left ventricular internal cavity size was normal in size. There is  no left ventricular hypertrophy. The interventricular septum is flattened in systole, consistent with right ventricular pressure overload. Left ventricular diastolic parameters are consistent with Grade I diastolic dysfunction (impaired relaxation). Right Ventricle: The right ventricular size is severely enlarged. No increase  in right ventricular wall thickness. Right ventricular systolic function is severely reduced. There is severely elevated pulmonary artery systolic pressure. The tricuspid regurgitant velocity is 4.28 m/s, and with an assumed right atrial pressure of 15 mmHg, the estimated right ventricular systolic pressure is 71.6 mmHg. Left Atrium: Left atrial size was normal in size. Right Atrium: Right atrial size was normal in size. Pericardium: There is no evidence of pericardial effusion. Mitral Valve: The mitral valve is grossly normal. Trivial mitral valve regurgitation. MV peak gradient, 3.1 mmHg. The mean mitral valve gradient is 1.0 mmHg. Tricuspid Valve: The tricuspid valve is normal in structure. Tricuspid valve regurgitation is moderate. Aortic Valve: The aortic valve is tricuspid. There is mild calcification of the aortic valve. There is moderate thickening of the aortic valve. Aortic valve regurgitation is not visualized. Mild to moderate aortic valve sclerosis/calcification is  present, without any evidence of aortic stenosis. Aortic valve mean gradient measures 2.0 mmHg. Aortic valve peak gradient measures 4.3 mmHg. Aortic valve area, by VTI measures 2.70 cm. Pulmonic Valve: The pulmonic valve was normal in structure. Pulmonic valve regurgitation is trivial. Aorta: The aortic root and ascending aorta are structurally normal, with no evidence of dilitation. Venous: The inferior vena cava is dilated in size with less than 50% respiratory variability, suggesting right atrial pressure of 15 mmHg. IAS/Shunts: No atrial level shunt detected by color flow Doppler.  LEFT VENTRICLE PLAX 2D LVIDd:         3.50 cm  Diastology LVIDs:         2.10 cm  LV e' medial:    5.11 cm/s LV PW:         1.00 cm  LV E/e' medial:  9.8 LV IVS:        1.00 cm  LV e' lateral:   7.40 cm/s LVOT diam:     1.90 cm  LV E/e' lateral: 6.8 LV SV:         33 LV SV Index:   18 LVOT Area:     2.84 cm  RIGHT VENTRICLE            IVC RV Basal diam:  3.40  cm    IVC diam: 2.40 cm RV S prime:     9.14 cm/s TAPSE (M-mode): 1.4 cm LEFT ATRIUM           Index       RIGHT ATRIUM           Index LA diam:      2.20 cm 1.19 cm/m  RA Area:     15.10 cm LA Vol (A4C): 29.2 ml 15.80 ml/m RA Volume:   35.50 ml  19.21 ml/m  AORTIC VALVE AV Area (Vmax):    2.30 cm AV Area (Vmean):   2.76 cm AV Area (VTI):     2.70 cm AV Vmax:           104.00 cm/s AV Vmean:          56.800 cm/s AV VTI:            0.123 m AV Peak Grad:      4.3 mmHg AV Mean Grad:      2.0 mmHg LVOT Vmax:         84.20 cm/s LVOT Vmean:        55.200 cm/s LVOT VTI:          0.117 m LVOT/AV VTI ratio: 0.95  AORTA Ao Root diam: 3.60 cm Ao Asc diam:  2.90 cm MITRAL VALVE               TRICUSPID VALVE MV Area (PHT): 2.46 cm    TR Peak grad:   73.3 mmHg MV Area VTI:   1.69 cm    TR Vmax:        428.00 cm/s MV Peak grad:  3.1 mmHg MV Mean grad:  1.0 mmHg    SHUNTS MV Vmax:       0.88 m/s    Systemic VTI:  0.12 m MV Vmean:      52.5 cm/s   Systemic Diam: 1.90 cm MV Decel Time: 309 msec MV E velocity: 50.10 cm/s MV A velocity: 75.80 cm/s MV E/A ratio:  0.66 Gwyndolyn Kaufman MD Electronically signed by Gwyndolyn Kaufman MD Signature Date/Time: 05/20/2020/1:22:39 PM    Final (Updated)    VAS  US LOWER EXTREMITY VENOUS (DVT)  Result Date: 05/20/2020  Lower Venous DVT Study Patient Name:  Ruben ReasonMARGARET W Sherburne  Date of Exam:   05/19/2020 Medical Rec #: [id number]           Accession #:    [id number] Date of Birth: 1950/09/11           Patient Gender: F Patient Age:   70Y [facility] [place] Procedure:      VAS US LOWER EXTREMITY VENOUS (DVT) Referring Phys: [id number] RONDELL A SMITH --------------------------------------------------------------------------------  Indications: Swelling.  Risk Factors: None identified. Limitations: Poor ultrasound/tissue interface and body habitus. Comparison Study: No prior studies. Performing Technologist: Chanda BusingGregory Collins RVT  Examination Guidelines: A complete evaluation  includes B-mode imaging, spectral Doppler, color Doppler, and power Doppler as needed of all accessible portions of each vessel. Bilateral testing is considered an integral part of a complete examination. Limited examinations for reoccurring indications may be performed as noted. The reflux portion of the exam is performed with the patient in reverse Trendelenburg.  +---------+---------------+---------+-----------+----------+--------------+ RIGHT    CompressibilityPhasicitySpontaneityPropertiesThrombus Aging +---------+---------------+---------+-----------+----------+--------------+ CFV      Full           Yes      Yes                                 +---------+---------------+---------+-----------+----------+--------------+ SFJ      Full                                                        +---------+---------------+---------+-----------+----------+--------------+ FV Prox  Full                                                        +---------+---------------+---------+-----------+----------+--------------+ FV Mid   Full                                                        +---------+---------------+---------+-----------+----------+--------------+ FV DistalFull                                                        +---------+---------------+---------+-----------+----------+--------------+ PFV      Full                                                        +---------+---------------+---------+-----------+----------+--------------+ POP      Partial        Yes      Yes                  Acute          +---------+---------------+---------+-----------+----------+--------------+  PTV      Full                                                        +---------+---------------+---------+-----------+----------+--------------+ PERO     None                                         Acute           +---------+---------------+---------+-----------+----------+--------------+ Gastroc  Full                                                        +---------+---------------+---------+-----------+----------+--------------+   +---------+---------------+---------+-----------+----------+--------------+ LEFT     CompressibilityPhasicitySpontaneityPropertiesThrombus Aging +---------+---------------+---------+-----------+----------+--------------+ CFV      Full           Yes      Yes                                 +---------+---------------+---------+-----------+----------+--------------+ SFJ      Full                                                        +---------+---------------+---------+-----------+----------+--------------+ FV Prox  Full                                                        +---------+---------------+---------+-----------+----------+--------------+ FV Mid   Full                                                        +---------+---------------+---------+-----------+----------+--------------+ FV Distal               Yes      Yes                                 +---------+---------------+---------+-----------+----------+--------------+ PFV      Full                                                        +---------+---------------+---------+-----------+----------+--------------+ POP      Partial        Yes      Yes                                 +---------+---------------+---------+-----------+----------+--------------+  PTV      None                                         Acute          +---------+---------------+---------+-----------+----------+--------------+ PERO     None                                         Acute          +---------+---------------+---------+-----------+----------+--------------+ Gastroc  Partial                                      Acute           +---------+---------------+---------+-----------+----------+--------------+     Summary: RIGHT: - Findings consistent with acute deep vein thrombosis involving the right popliteal vein, and right peroneal veins. - No cystic structure found in the popliteal fossa.  LEFT: - Findings consistent with acute deep vein thrombosis involving the left popliteal vein, left posterior tibial veins, left peroneal veins, and left gastrocnemius veins. - No cystic structure found in the popliteal fossa.  *See table(s) above for measurements and observations. Electronically signed by Deitra Mayo MD on 05/20/2020 at 8:22:30 PM.    Final     Labs:  CBC: Recent Labs    05/18/20 2230 05/19/20 1509 05/19/20 2052 05/21/20 0440 05/22/20 0018 05/23/20 0440  WBC 9.2  --   --  9.7 11.0* 10.9*  HGB 9.1*   < > 9.7* 9.3* 7.8* 7.8*  HCT 29.1*   < > 30.8* 30.4* 24.7* 26.0*  PLT 197  --   --  213 162 205   < > = values in this interval not displayed.    COAGS: No results for input(s): INR, APTT in the last 8760 hours.  BMP: Recent Labs    04/06/20 0620 05/12/20 1600 05/15/20 0452 05/22/20 0018  NA 136 139 137 135  K 3.7 4.2 4.2 3.3*  CL 101 104 102 102  CO2 25 27 25 25   GLUCOSE 161* 139* 157* 110*  BUN 14 19 20  25*  CALCIUM 8.5* 9.5 8.3* 7.6*  CREATININE 1.08* 0.82 0.97 0.73  GFRNONAA 56* >60 >60 >60    LIVER FUNCTION TESTS: Recent Labs    05/22/20 0018  BILITOT 0.5  AST 24  ALT 46*  ALKPHOS 111  PROT 4.6*  ALBUMIN 2.5*    Assessment and Plan:  Bilat PE/ Rt heart strain 5/21: Bilateral pulmonary angiography and extirpation of pulmonary emboli with mechanical thrombectomy Doing well Will follow chart- call if need IR  Electronically Signed: Lavonia Drafts, PA-C 05/23/2020, 9:52 AM   I spent a total of 15 Minutes at the the patient's bedside AND on the patient's hospital floor or unit, greater than 50% of which was counseling/coordinating care for B PE- mechanical  thrombectomy

## 2020-05-23 NOTE — Discharge Instructions (Signed)
Information on my medicine - ELIQUIS (apixaban)  Why was Eliquis prescribed for you? Eliquis was prescribed to treat blood clots that may have been found in the veins of your legs (deep vein thrombosis) or in your lungs (pulmonary embolism) and to reduce the risk of them occurring again.  What do You need to know about Eliquis ? The starting dose is 10 mg (two 5 mg tablets) taken TWICE daily for the FIRST SEVEN (7) DAYS, then on 05/30/20 the dose is reduced to ONE 5 mg tablet taken TWICE daily.  Eliquis may be taken with or without food.   Try to take the dose about the same time in the morning and in the evening. If you have difficulty swallowing the tablet whole please discuss with your pharmacist how to take the medication safely.  Take Eliquis exactly as prescribed and DO NOT stop taking Eliquis without talking to the doctor who prescribed the medication.  Stopping may increase your risk of developing a new blood clot.  Refill your prescription before you run out.  After discharge, you should have regular check-up appointments with your healthcare provider that is prescribing your Eliquis.    What do you do if you miss a dose? If a dose of ELIQUIS is not taken at the scheduled time, take it as soon as possible on the same day and twice-daily administration should be resumed. The dose should not be doubled to make up for a missed dose.  Important Safety Information A possible side effect of Eliquis is bleeding. You should call your healthcare provider right away if you experience any of the following: ? Bleeding from an injury or your nose that does not stop. ? Unusual colored urine (red or dark brown) or unusual colored stools (red or black). ? Unusual bruising for unknown reasons. ? A serious fall or if you hit your head (even if there is no bleeding).  Some medicines may interact with Eliquis and might increase your risk of bleeding or clotting while on Eliquis. To help avoid  this, consult your healthcare provider or pharmacist prior to using any new prescription or non-prescription medications, including herbals, vitamins, non-steroidal anti-inflammatory drugs (NSAIDs) and supplements.  This website has more information on Eliquis (apixaban): http://www.eliquis.com/eliquis/home

## 2020-05-23 NOTE — Progress Notes (Signed)
Physical Therapy Treatment Patient Details Name: Karen Ramirez MRN: [id number] DOB: August 30, 1950 Today's Date: 05/23/2020    History of Present Illness Karen Ramirez is a 70 year old individual who on April 5 underwent decompression and fusion at L1-2 L2-3 and L3-4.  She struggled with significant postoperative pain and follow-up films demonstrate the presence of loss of fixation of the L4 vertebrae.  She developed a kyphotic angulation with retropulsion of bone.  She had severe left lumbar radicular pain. 05/14/20 s/p Lumbar decompression of L4 revision of hardware to include fixation to the pelvis from L1.    PT Comments    Patient progressing towards physical therapy goals. Patient on RA with spO2 maintaining >95% throughout session. Patient required modA+2 for sit to stand and minA+2 for stand pivot transfer to chair with RW. Patient able to recall 3/3 back precautions and able to maintain throughout. Continue to recommend SNF for ongoing Physical Therapy.       Follow Up Recommendations  SNF     Equipment Recommendations  None recommended by PT    Recommendations for Other Services       Precautions / Restrictions Precautions Precautions: Back;Fall Precaution Booklet Issued: Yes (comment) Precaution Comments: reviewed back precautions Required Braces or Orthoses: Spinal Brace Spinal Brace: Lumbar corset;Applied in sitting position Restrictions Weight Bearing Restrictions: No    Mobility  Bed Mobility Overal bed mobility: Needs Assistance Bed Mobility: Rolling;Sidelying to Sit Rolling: Supervision Sidelying to sit: Min assist       General bed mobility comments: minA to assist with bringing LEs off bed    Transfers Overall transfer level: Needs assistance Equipment used: Rolling [name] (2 wheeled) Transfers: Sit to/from [facility] Sit to Stand: Mod assist;+2 physical assistance;+2 safety/equipment;From elevated surface Stand pivot transfers:  Min assist;+2 safety/equipment;+2 physical assistance       General transfer comment: modA+2 for power up into standing x 2. Cues for hand placement. Blocking of R knee on initial stand. MinA+2 for stand pivot to chair with pivotal steps with no R knee buckling  Ambulation/Gait             General Gait Details: deferred due to fatigue   Stairs             Wheelchair Mobility    Modified Rankin (Stroke Patients Only)       Balance Overall balance assessment: Needs assistance Sitting-balance support: Feet supported;No upper extremity supported Sitting balance-Leahy Scale: Fair     Standing balance support: Bilateral upper extremity supported;During functional activity Standing balance-Leahy Scale: Poor Standing balance comment: reliant on external support and UE support                            Cognition Arousal/Alertness: Awake/alert Behavior During Therapy: WFL for tasks assessed/performed Overall Cognitive Status: Within Functional Limits for tasks assessed                                        Exercises General Exercises - Lower Extremity Ankle Circles/Pumps: AROM;Both;10 reps;Seated    General Comments        Pertinent Vitals/Pain Pain Assessment: Faces Faces Pain Scale: Hurts little more Pain Location: R hip Pain Descriptors / Indicators: Grimacing;Guarding;Sore Pain Intervention(s): Limited activity within patient's tolerance;Monitored during session;Repositioned    Home Living  Prior Function            PT Goals (current goals can now be found in the care plan section) Acute Rehab PT Goals Patient Stated Goal: to go to rehab PT Goal Formulation: With patient Time For Goal Achievement: 05/28/20 Potential to Achieve Goals: Good Progress towards PT goals: Progressing toward goals    Frequency    Min 4X/week      PT Plan Current plan remains appropriate     Co-evaluation              AM-PAC PT "6 Clicks" Mobility   Outcome Measure  Help needed turning from your back to your side while in a flat bed without using bedrails?: A Little Help needed moving from lying on your back to sitting on the side of a flat bed without using bedrails?: A Little Help needed moving to and from a bed to a chair (including a wheelchair)?: A Little Help needed standing up from a chair using your arms (e.g., wheelchair or bedside chair)?: A Lot Help needed to walk in hospital room?: A Lot Help needed climbing 3-5 steps with a railing? : Total 6 Click Score: 14    End of Session Equipment Utilized During Treatment: Gait belt;Back brace Activity Tolerance: Patient tolerated treatment well Patient left: in chair;with call bell/phone within reach;with family/visitor present Nurse Communication: Mobility status PT Visit Diagnosis: Muscle weakness (generalized) (M62.81);Difficulty in walking, not elsewhere classified (R26.2);Pain Pain - Right/Left: Right Pain - part of body: Hip     Time: 1001-1034 PT Time Calculation (min) (ACUTE ONLY): 33 min  Charges:  $Therapeutic Activity: 23-37 mins                     Trigg Delarocha A. Gilford Rile PT, DPT Acute Rehabilitation Services Pager [phone] Office [phone]    Linna Hoff 05/23/2020, 11:13 AM

## 2020-05-23 NOTE — Care Management Important Message (Signed)
Important Message  Patient Details  Name: MIEL WISENER MRN: [id number] Date of Birth: March 14, 1950   Medicare Important Message Given:  Yes     Shelda Altes 05/23/2020, 12:11 PM

## 2020-05-23 NOTE — Progress Notes (Signed)
   Rt groin pursestring suture was removed at bedside in its entirety. Bandage placed. No bleeding

## 2020-05-23 NOTE — Progress Notes (Signed)
ANTICOAGULATION CONSULT NOTE - Follow Up Consult  Pharmacy Consult for Heparin Indication: DVT and PE  Allergies  Allergen Reactions  . Azithromycin Diarrhea  . Levaquin [Levofloxacin] Nausea And Vomiting  . Ultram [Tramadol] Nausea And Vomiting    Patient Measurements: Height: 5' 2.52" (158.8 cm) Weight: 82.6 kg (182 lb 1.6 oz) IBW/kg (Calculated) : 51.3 Heparin Dosing Weight:    Vital Signs: Temp: 97.8 F (36.6 C) (05/23 0417) Temp Source: Oral (05/23 0417) BP: 102/56 (05/23 0417) Pulse Rate: 71 (05/23 0417)  Labs: Recent Labs    05/21/20 0440 05/21/20 1415 05/22/20 0018 05/22/20 0658 05/22/20 1305 05/23/20 0440  HGB 9.3*  --  7.8*  --   --  7.8*  HCT 30.4*  --  24.7*  --   --  26.0*  PLT 213  --  162  --   --  205  HEPARINUNFRC >1.10*   < > 0.46 0.41 0.29* 0.23*  CREATININE  --   --  0.73  --   --   --    < > = values in this interval not displayed.    Estimated Creatinine Clearance: 65.9 mL/min (by C-G formula based on SCr of 0.73 mg/dL).   Assessment: Anticoag: new DVT and PE with severe RHS, s/p back surgery. D-Dimer 16.44 (5/19). Post-op anemia 5/15, transfused, Hgb now 9.3 and plts WNL. 5/21 IR: Bilateral pulmonary angiography and extirpation of pulmonary emboli with mechanical thrombectomy. Con't IV heparin post-IR (later noted got 7500 units heparin in procedure)  Heparin level down to subtherapeutic (0.23) on gtt at 900 units/hr. No issues with bleeding reported per RN. Pt's line has been beeping a good bit over the last 24 hours required pausing of heparin for a few minutes around midnight and restarting  Goal of Therapy:  Heparin level 0.3-0.7 units/ml Monitor platelets by anticoagulation protocol: Yes   Plan:  Increase IV heparin to 1000 units/hr F/u 8 hr heparin level  Sherlon Handing, PharmD, BCPS Please see amion for complete clinical pharmacist phone list 05/23/2020,5:40 AM

## 2020-05-23 NOTE — Progress Notes (Signed)
ANTICOAGULATION CONSULT NOTE - Follow Up Consult  Pharmacy Consult for Heparin to Eliquis Indication: DVT and PE  Allergies  Allergen Reactions  . Azithromycin Diarrhea  . Levaquin [Levofloxacin] Nausea And Vomiting  . Ultram [Tramadol] Nausea And Vomiting    Patient Measurements: Height: 5' 2.52" (158.8 cm) Weight: 82.6 kg (182 lb 1.6 oz) IBW/kg (Calculated) : 51.3    Vital Signs: Temp: 97.8 F (36.6 C) (05/23 0417) Temp Source: Oral (05/23 0417) BP: 102/56 (05/23 0417) Pulse Rate: 71 (05/23 0417)  Labs: Recent Labs    05/21/20 0440 05/21/20 1415 05/22/20 0018 05/22/20 0658 05/22/20 1305 05/23/20 0440  HGB 9.3*  --  7.8*  --   --  7.8*  HCT 30.4*  --  24.7*  --   --  26.0*  PLT 213  --  162  --   --  205  HEPARINUNFRC >1.10*   < > 0.46 0.41 0.29* 0.23*  CREATININE  --   --  0.73  --   --   --    < > = values in this interval not displayed.    Estimated Creatinine Clearance: 65.9 mL/min (by C-G formula based on SCr of 0.73 mg/dL).   Assessment: 70 year old female  with new DVT and PE with severe RHS, s/p back surgery. Now s/p IR: Bilateral pulmonary angiography and extirpation of pulmonary emboli with mechanical thrombectomy.  Heparin to transition to Eliquis  Goal of Therapy:  Monitor platelets by anticoagulation protocol: Yes   Plan:  DC heparin and heparin labs Eliquis 10 mg po BID x 7 days then 5 mg po BID Benefits check completed 5/20  Thank you Anette Guarneri, PharmD 05/23/2020,7:47 AM

## 2020-05-24 DIAGNOSIS — S32049A Unspecified fracture of fourth lumbar vertebra, initial encounter for closed fracture: Secondary | ICD-10-CM

## 2020-05-24 DIAGNOSIS — K219 Gastro-esophageal reflux disease without esophagitis: Secondary | ICD-10-CM

## 2020-05-24 DIAGNOSIS — I2699 Other pulmonary embolism without acute cor pulmonale: Secondary | ICD-10-CM

## 2020-05-24 DIAGNOSIS — I872 Venous insufficiency (chronic) (peripheral): Secondary | ICD-10-CM

## 2020-05-24 DIAGNOSIS — M353 Polymyalgia rheumatica: Secondary | ICD-10-CM

## 2020-08-30 ENCOUNTER — Other Ambulatory Visit: Payer: Self-pay | Admitting: Internal Medicine

## 2020-08-30 DIAGNOSIS — Z86711 Personal history of pulmonary embolism: Secondary | ICD-10-CM

## 2020-08-30 DIAGNOSIS — Z86718 Personal history of other venous thrombosis and embolism: Secondary | ICD-10-CM

## 2020-09-01 ENCOUNTER — Other Ambulatory Visit: Payer: Self-pay

## 2020-09-01 ENCOUNTER — Ambulatory Visit
Admission: RE | Admit: 2020-09-01 | Discharge: 2020-09-01 | Disposition: A | Discharge status: Home / Self Care | Payer: Medicare Other | Source: Ambulatory Visit | Attending: Internal Medicine | Admitting: Internal Medicine

## 2020-09-01 DIAGNOSIS — Z86718 Personal history of other venous thrombosis and embolism: Secondary | ICD-10-CM | POA: Diagnosis present

## 2020-09-01 DIAGNOSIS — Z86711 Personal history of pulmonary embolism: Secondary | ICD-10-CM | POA: Diagnosis present

## 2020-10-25 ENCOUNTER — Ambulatory Visit (INDEPENDENT_AMBULATORY_CARE_PROVIDER_SITE_OTHER): Payer: Medicare Other | Admitting: Dermatology

## 2020-10-25 ENCOUNTER — Other Ambulatory Visit: Payer: Self-pay

## 2020-10-25 ENCOUNTER — Other Ambulatory Visit: Payer: Self-pay | Admitting: Dermatology

## 2020-10-25 DIAGNOSIS — L309 Dermatitis, unspecified: Secondary | ICD-10-CM

## 2020-10-25 DIAGNOSIS — L308 Other specified dermatitis: Secondary | ICD-10-CM

## 2020-10-25 DIAGNOSIS — R21 Rash and other nonspecific skin eruption: Secondary | ICD-10-CM

## 2020-10-25 MED ORDER — MOMETASONE FUROATE 0.1 % EX CREA: TOPICAL_CREAM | CUTANEOUS | 1 refills | Status: DC

## 2020-10-25 NOTE — Progress Notes (Signed)
Follow-Up Visit   Subjective  Karen Ramirez is a 70 y.o. female who presents for the following: Rash (Upper chest x 5 days. She has had similar rash in the past, always on the upper chest. Last flare was 05/2019, previously 09/2018 and had biopsy proven Contact or Nummular Dermatitis. DIF negative. She is using Cortisone Cream and oral Benadryl at night. 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 She will remain on 1mg  Prednisone until she sees Dr Jefm Bryant in December. No new products used. Not in sun prior to flare. ). Rash has gotten worse as her Prednisone has been decreased  It is very itchy.  The following portions of the chart were reviewed this encounter and updated as appropriate:       Review of Systems:  No other skin or systemic complaints except as noted in HPI or Assessment and Plan.  Objective  Well appearing patient in no apparent distress; mood and affect are within normal limits.  A focused examination was performed including chest, neck. Relevant physical exam findings are noted in the Assessment and Plan.  L anterior lower neck, R anterior lower neck Bright pink macular-papular rash of the upper chest, medial shoulders, and anterior neck, pt denies sun exposure          Assessment & Plan  Rash L anterior lower neck; R anterior lower neck  Punch biopsies x 2 performed today 3.67mm to the R and L ant lower neck. Dermatitis r/o SCLE vs Dermatomyositis.   Restart mometasone cream Apply to AA rash BID until improved. Avoid face, groin, axilla.   Discussed True Test Patch Testing. May consider on follow-up. Patient will still be on Prednisone 1mg  prescribed by Dr. Jefm Bryant for PMR.   Topical steroids (such as triamcinolone, fluocinolone, fluocinonide, mometasone, clobetasol, halobetasol, betamethasone, hydrocortisone) can cause thinning and lightening of the skin if they are used for too long in the same area. Your  physician has selected the right strength medicine for your problem and area affected on the body. Please use your medication only as directed by your physician to prevent side effects.     Skin / nail biopsy - R anterior lower neck Type of biopsy: punch   Informed consent: discussed and consent obtained   Patient was prepped and draped in usual sterile fashion: Area prepped with alcohol. Anesthesia: the lesion was anesthetized in a standard fashion   Anesthetic:  1% lidocaine w/ epinephrine 1-100,000 buffered w/ 8.4% NaHCO3 Punch size:  3.5 mm Suture size:  4-0 Suture type: nylon   Suture removal (days):  7 Hemostasis achieved with: suture and pressure   Outcome: patient tolerated procedure well   Post-procedure details: wound care instructions given   Post-procedure details comment:  Ointment and pressure dressing applied Additional details:  For H&E  Skin / nail biopsy - L anterior lower neck Type of biopsy: punch   Informed consent: discussed and consent obtained   Patient was prepped and draped in usual sterile fashion: Area prepped with alcohol. Anesthesia: the lesion was anesthetized in a standard fashion   Anesthetic:  1% lidocaine w/ epinephrine 1-100,000 buffered w/ 8.4% NaHCO3 Punch size:  3.5 mm Suture size:  4-0 Suture type: nylon   Suture removal (days):  7 Hemostasis achieved with: suture and pressure   Outcome: patient tolerated procedure well   Post-procedure details: wound care instructions given   Post-procedure details comment:  Ointment and pressure dressing applied Additional details:  For DIF  Specimen 1 - Surgical pathology Differential Diagnosis: Dermatitis r/o SCLE vs dermatomyositis Check Margins: No Bright pink macular-papular rash of the upper chest, medial shoulders, and anterior neck Patient is on 1mg  prednisone for treatment of PMR. Previous biopsy [id number]  Specimen 2 - Surgical pathology Differential Diagnosis: Dermatitis r/o SCLE vs  dermatomyositis Check Margins: No DIF Bright pink macular-papular rash of the upper chest, medial shoulders, and anterior neck. Patient is on 1mg  prednisone for treatment of PMR.   Related Medications mometasone (ELOCON) 0.1 % cream Apply to affected areas rash once to twice daily until improved. Avoid face, groin, axilla.  Return in about 1 week (around 11/01/2020) for biopsy f/u and SR, possible patch test.  I, Karen Ramirez, CMA, am acting as scribe for Karen Patty, MD . Documentation: I have reviewed the above documentation for accuracy and completeness, and I agree with the above.  Karen Patty MD

## 2020-10-25 NOTE — Patient Instructions (Addendum)
Wound Care Instructions  Cleanse wound gently with soap and water once a day then pat dry with clean gauze. Apply a thing coat of Petrolatum (petroleum jelly, "Vaseline") over the wound (unless you have an allergy to this). We recommend that you use a new, sterile tube of Vaseline. Do not pick or remove scabs. Do not remove the yellow or white "healing tissue" from the base of the wound.  Cover the wound with fresh, clean, nonstick gauze and secure with paper tape. You may use Band-Aids in place of gauze and tape if the would is small enough, but would recommend trimming much of the tape off as there is often too much. Sometimes Band-Aids can irritate the skin.  You should call the office for your biopsy report after 1 week if you have not already been contacted.  If you experience any problems, such as abnormal amounts of bleeding, swelling, significant bruising, significant pain, or evidence of infection, please call the office immediately.  FOR ADULT SURGERY PATIENTS: If you need something for pain relief you may take 1 extra strength Tylenol (acetaminophen) AND 2 Ibuprofen (200mg each) together every 4 hours as needed for pain. (do not take these if you are allergic to them or if you have a reason you should not take them.) Typically, you may only need pain medication for 1 to 3 days.   If you have any questions or concerns for your doctor, please call our main line at [phone] and press option 4 to reach your doctor's medical assistant. If no one answers, please leave a voicemail as directed and we will return your call as soon as possible. Messages left after 4 pm will be answered the following business day.   You may also send us a message via MyChart. We typically respond to MyChart messages within 1-2 business days.  For prescription refills, please ask your pharmacy to contact our office. Our fax number is [phone].  If you have an urgent issue when the clinic is closed that  cannot wait until the next business day, you can page your doctor at the number below.    Please note that while we do our best to be available for urgent issues outside of office hours, we are not available 24/7.   If you have an urgent issue and are unable to reach us, you may choose to seek medical care at your doctor's office, retail clinic, urgent care center, or emergency room.  If you have a medical emergency, please immediately call 911 or go to the emergency department.  Pager Numbers  - Dr. Kowalski: [phone]  - Dr. Moye: [phone]  - Dr. Stewart: [phone]  In the event of inclement weather, please call our main line at [phone] for an update on the status of any delays or closures.  Dermatology Medication Tips: Please keep the boxes that topical medications come in in order to help keep track of the instructions about where and how to use these. Pharmacies typically print the medication instructions only on the boxes and not directly on the medication tubes.   If your medication is too expensive, please contact our office at [phone] option 4 or send us a message through MyChart.   We are unable to tell what your co-pay for medications will be in advance as this is different depending on your insurance coverage. However, we may be able to find a substitute medication at lower cost or fill out paperwork to get insurance to cover a needed   medication.   If a prior authorization is required to get your medication covered by your insurance company, please allow us 1-2 business days to complete this process.  Drug prices often vary depending on where the prescription is filled and some pharmacies may offer cheaper prices.  The website www.goodrx.com contains coupons for medications through different pharmacies. The prices here do not account for what the cost may be with help from insurance (it may be cheaper with your insurance), but the website can give you the  price if you did not use any insurance.  - You can print the associated coupon and take it with your prescription to the pharmacy.  - You may also stop by our office during regular business hours and pick up a GoodRx coupon card.  - If you need your prescription sent electronically to a different pharmacy, notify our office through [place] MyChart or by phone at [phone] option 4.   

## 2020-11-01 ENCOUNTER — Other Ambulatory Visit: Payer: Self-pay

## 2020-11-01 ENCOUNTER — Ambulatory Visit (INDEPENDENT_AMBULATORY_CARE_PROVIDER_SITE_OTHER): Payer: Medicare Other | Admitting: Dermatology

## 2020-11-01 DIAGNOSIS — L820 Inflamed seborrheic keratosis: Secondary | ICD-10-CM | POA: Diagnosis not present

## 2020-11-01 DIAGNOSIS — L821 Other seborrheic keratosis: Secondary | ICD-10-CM | POA: Diagnosis not present

## 2020-11-01 DIAGNOSIS — L309 Dermatitis, unspecified: Secondary | ICD-10-CM

## 2020-11-01 DIAGNOSIS — D489 Neoplasm of uncertain behavior, unspecified: Secondary | ICD-10-CM

## 2020-11-01 DIAGNOSIS — L308 Other specified dermatitis: Secondary | ICD-10-CM | POA: Diagnosis not present

## 2020-11-01 NOTE — Progress Notes (Signed)
   Follow-Up Visit   Subjective  Karen Ramirez is a 70 y.o. female who presents for the following: Follow-up (Patient here for follow up on biopsy at neck from rash. Patient reports rash at neck is doing a lot better today. Patient reports she has been  using mometasone; and benadryl at night and is looking a lot better. ).  She also has some bumps on neck that get irritated.     The following portions of the chart were reviewed this encounter and updated as appropriate:      Review of Systems: No other skin or systemic complaints except as noted in HPI or Assessment and Plan.   Objective  Well appearing patient in no apparent distress; mood and affect are within normal limits.  A focused examination was performed including back, neck, left clavicle. Relevant physical exam findings are noted in the Assessment and Plan.  left clavicle 5 mm pink pearly papule        back, left anterior lower neck and right anterior lower neck Decreased erythema on chest, some residual erythema surrounding bx sites.  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 True test x 36 applied to back at visit today. Pt advised to keep panels dry until removal in 2 days for first read.  F/up with Dr. Chauncey Cruel in 1 week  Continue mometasone cream to aas chest/neck bid until itchy rash cleared  Wound cleansed, sutures removed. Discussed pathology results.    Patch Test - back, left anterior lower neck and right anterior  lower neck  Inflamed seborrheic keratosis neck x 9  Destruction of lesion - neck x 9  Destruction method: cryotherapy   Informed consent: discussed and consent obtained   Lesion destroyed using liquid nitrogen: Yes   Region frozen until ice ball extended beyond lesion: Yes   Outcome: patient tolerated procedure well with no complications   Post-procedure details: wound care instructions given   Additional details:  Prior to procedure, discussed risks of blister formation, small wound, skin dyspigmentation, or rare scar following cryotherapy. Recommend Vaseline ointment to treated areas while healing.   Seborrheic Keratoses - Stuck-on, waxy, tan-brown papules and/or plaques  - Benign-appearing - Discussed benign etiology and prognosis. - Observe - Call for any changes   Return for Thursday nurse visit to read patch test, 1 week follow up for rash and biopsy.  I, Ruthell Rummage, CMA, am acting as scribe for Brendolyn Patty, MD.  Documentation: I have reviewed the above documentation for accuracy and completeness, and I agree with the above.  Brendolyn Patty MD

## 2020-11-01 NOTE — Patient Instructions (Addendum)

## 2020-11-03 ENCOUNTER — Ambulatory Visit (INDEPENDENT_AMBULATORY_CARE_PROVIDER_SITE_OTHER): Payer: Medicare Other

## 2020-11-03 ENCOUNTER — Other Ambulatory Visit: Payer: Self-pay

## 2020-11-03 DIAGNOSIS — L308 Other specified dermatitis: Secondary | ICD-10-CM | POA: Diagnosis not present

## 2020-11-03 DIAGNOSIS — L309 Dermatitis, unspecified: Secondary | ICD-10-CM

## 2020-11-03 NOTE — Progress Notes (Signed)
Patient here today for day 3 of True Test reading. Patch test removed from patients back. Patient had positive reaction to site #27. She also understands how to read and review testing starting tomorrow through Monday. Patient instructed to call with any questions or concerns.

## 2020-11-09 ENCOUNTER — Ambulatory Visit (INDEPENDENT_AMBULATORY_CARE_PROVIDER_SITE_OTHER): Payer: Medicare Other | Admitting: Dermatology

## 2020-11-09 ENCOUNTER — Other Ambulatory Visit: Payer: Self-pay

## 2020-11-09 ENCOUNTER — Encounter: Payer: Self-pay | Admitting: Dermatology

## 2020-11-09 DIAGNOSIS — D492 Neoplasm of unspecified behavior of bone, soft tissue, and skin: Secondary | ICD-10-CM

## 2020-11-09 DIAGNOSIS — L309 Dermatitis, unspecified: Secondary | ICD-10-CM

## 2020-11-09 DIAGNOSIS — C44519 Basal cell carcinoma of skin of other part of trunk: Secondary | ICD-10-CM | POA: Diagnosis not present

## 2020-11-09 DIAGNOSIS — C4491 Basal cell carcinoma of skin, unspecified: Secondary | ICD-10-CM

## 2020-11-09 DIAGNOSIS — L308 Other specified dermatitis: Secondary | ICD-10-CM | POA: Diagnosis not present

## 2020-11-09 HISTORY — DX: Basal cell carcinoma of skin, unspecified: C44.91

## 2020-11-09 NOTE — Progress Notes (Signed)
   Follow-Up Visit   Subjective  CECELIA GRACIANO is a 70 y.o. female who presents for the following: Follow-up (Here for final patch test reading. Patient has only noticed reaction at #27: Tixocortol-21-Pivalate. Hx of Bx proven spongiotic dermatitis. Using Mometasone. Back and neck.).  She has improved.  Also here for skin biopsy spot on clavicle.    The following portions of the chart were reviewed this encounter and updated as appropriate:      Review of Systems: No other skin or systemic complaints except as noted in HPI or Assessment and Plan.   Objective  Well appearing patient in no apparent distress; mood and affect are within normal limits.  A focused examination was performed including face, back. Relevant physical exam findings are noted in the Assessment and Plan.  Back, Neck 2+ reaction to #27: Tixocortol-21-Pivalate  Neck/chest is clear  Left clavicle 32mm pink pearly papule at left clavicle     Assessment & Plan  Dermatitis Back, Neck  Bx-proven, resolved with treatment, Patch test positive to Tixocortal. Probable contact dermatitis from OTC cortisone 10 cream.  Information handout given to patient today with list of topical steroids to avoid.  Mometasone cream is not on list of allergens. Okay to continue qd/bid prn itchy rash.   Neoplasm of skin Left clavicle  Skin / nail biopsy Type of biopsy: tangential   Informed consent: discussed and consent obtained   Anesthesia: the lesion was anesthetized in a standard fashion   Anesthesia comment:  Area prepped with alcohol Anesthetic:  1% lidocaine w/ epinephrine 1-100,000 buffered w/ 8.4% NaHCO3 Instrument used: flexible razor blade   Hemostasis achieved with: pressure, aluminum chloride and electrodesiccation   Outcome: patient tolerated procedure well   Post-procedure details: wound care instructions given   Post-procedure details comment:  Ointment and small bandage applied  Specimen 1 - Surgical  pathology Differential Diagnosis: hypertrophic scar, R/O BCC  Check Margins: No  Hypertrophic scar r/o BCC  Return pending bx.  I, Emelia Salisbury, CMA, am acting as scribe for Brendolyn Patty, MD.  Documentation: I have reviewed the above documentation for accuracy and completeness, and I agree with the above.  Brendolyn Patty MD

## 2020-11-09 NOTE — Patient Instructions (Signed)
Wound Care Instructions  Cleanse wound gently with soap and water once a day then pat dry with clean gauze. Apply a thing coat of Petrolatum (petroleum jelly, "Vaseline") over the wound (unless you have an allergy to this). We recommend that you use a new, sterile tube of Vaseline. Do not pick or remove scabs. Do not remove the yellow or white "healing tissue" from the base of the wound.  Cover the wound with fresh, clean, nonstick gauze and secure with paper tape. You may use Band-Aids in place of gauze and tape if the would is small enough, but would recommend trimming much of the tape off as there is often too much. Sometimes Band-Aids can irritate the skin.  You should call the office for your biopsy report after 1 week if you have not already been contacted.  If you experience any problems, such as abnormal amounts of bleeding, swelling, significant bruising, significant pain, or evidence of infection, please call the office immediately.  FOR ADULT SURGERY PATIENTS: If you need something for pain relief you may take 1 extra strength Tylenol (acetaminophen) AND 2 Ibuprofen (200mg  each) together every 4 hours as needed for pain. (do not take these if you are allergic to them or if you have a reason you should not take them.) Typically, you may only need pain medication for 1 to 3 days.    Gentle Skin Care Guide  1. Bathe no more than once a day.  2. Avoid bathing in hot water  3. Use a mild soap like Dove, Vanicream, Cetaphil, CeraVe. Can use Lever 2000 or Cetaphil antibacterial soap  4. Use soap only where you need it. On most days, use it under your arms, between your legs, and on your feet. Let the water rinse other areas unless visibly dirty.  5. When you get out of the bath/shower, use a towel to gently blot your skin dry, don't rub it.  6. While your skin is still a little damp, apply a moisturizing cream such as Vanicream, CeraVe, Cetaphil, Eucerin, Sarna lotion or plain Vaseline  Jelly. For hands apply Neutrogena [place] Hand Cream or Excipial Hand Cream.  7. Reapply moisturizer any time you start to itch or feel dry.  8. Sometimes using free and clear laundry detergents can be helpful. Fabric softener sheets should be avoided. Downy Free & Gentle liquid, or any liquid fabric softener that is free of dyes and perfumes, it acceptable to use  9. If your doctor has given you prescription creams you may apply moisturizers over them   If you have any questions or concerns for your doctor, please call our main line at [phone] and press option 4 to reach your doctor's medical assistant. If no one answers, please leave a voicemail as directed and we will return your call as soon as possible. Messages left after 4 pm will be answered the following business day.   You may also send [place] a message via [place]. We typically respond to MyChart messages within 1-2 business days.  For prescription refills, please ask your pharmacy to contact our office. Our fax number is [phone].  If you have an urgent issue when the clinic is closed that cannot wait until the next business day, you can page your doctor at the number below.    Please note that while we do our best to be available for urgent issues outside of office hours, we are not available 24/7.   If you have an urgent issue and are  unable to reach [place], you may choose to seek medical care at your doctor's office, retail clinic, urgent care center, or emergency room.  If you have a medical emergency, please immediately call 911 or go to the emergency department.  Pager Numbers  - Dr. Nehemiah Massed: [phone]  - Dr. Laurence Ferrari: [phone]  - Dr. Nicole Kindred: [phone]  In the event of inclement weather, please call our main line at [phone] for an update on the status of any delays or closures.  Dermatology Medication Tips: Please keep the boxes that topical medications come in in order to help keep track of the  instructions about where and how to use these. Pharmacies typically print the medication instructions only on the boxes and not directly on the medication tubes.   If your medication is too expensive, please contact our office at [phone] option 4 or send [place] a message through [place].   We are unable to tell what your co-pay for medications will be in advance as this is different depending on your insurance coverage. However, we may be able to find a substitute medication at lower cost or fill out paperwork to get insurance to cover a needed medication.   If a prior authorization is required to get your medication covered by your insurance company, please allow [place] 1-2 business days to complete this process.  Drug prices often vary depending on where the prescription is filled and some pharmacies may offer cheaper prices.  The website www.goodrx.com contains coupons for medications through different pharmacies. The prices here do not account for what the cost may be with help from insurance (it may be cheaper with your insurance), but the website can give you the price if you did not use any insurance.  - You can print the associated coupon and take it with your prescription to the pharmacy.  - You may also stop by our office during regular business hours and pick up a GoodRx coupon card.  - If you need your prescription sent electronically to a different pharmacy, notify our office through [facility] or by phone at [phone] option 4.

## 2020-11-14 ENCOUNTER — Telehealth: Payer: Self-pay

## 2020-11-14 NOTE — Telephone Encounter (Signed)
-----   Message from Brendolyn Patty, MD sent at 11/14/2020  3:08 PM EST ----- Skin , left clavicle BASAL CELL CARCINOMA, NODULAR PATTERN, BASE INVOLVED  BCC skin cancer, needs EDC - please call patient

## 2020-11-14 NOTE — Telephone Encounter (Signed)
Advised pt of bx results and scheduled pt for EDC./sh 

## 2020-12-07 ENCOUNTER — Ambulatory Visit (INDEPENDENT_AMBULATORY_CARE_PROVIDER_SITE_OTHER): Payer: Medicare Other | Admitting: Dermatology

## 2020-12-07 ENCOUNTER — Encounter: Payer: Self-pay | Admitting: Dermatology

## 2020-12-07 ENCOUNTER — Other Ambulatory Visit: Payer: Self-pay

## 2020-12-07 DIAGNOSIS — L578 Other skin changes due to chronic exposure to nonionizing radiation: Secondary | ICD-10-CM

## 2020-12-07 DIAGNOSIS — C44519 Basal cell carcinoma of skin of other part of trunk: Secondary | ICD-10-CM

## 2020-12-07 NOTE — Progress Notes (Signed)
   Follow-Up Visit   Subjective  Karen Ramirez is a 70 y.o. female who presents for the following: Skin Cancer (Here for [facility] of BCC at left clavicular chest. Bx: 11/09/2020.).  h/o dermatitis (possible contact to OTC HC cream- pt has pos. patch test to Tixocortal) of chest which has resolved with treatment.    The following portions of the chart were reviewed this encounter and updated as appropriate:      Review of Systems: No other skin or systemic complaints except as noted in HPI or Assessment and Plan.   Objective  Well appearing patient in no apparent distress; mood and affect are within normal limits.  A focused examination was performed including face, chest. Relevant physical exam findings are noted in the Assessment and Plan.  left clavicle Pink slightly indurated scar 0.4 cm  Assessment & Plan  Basal cell carcinoma (BCC) of skin of other part of torso left clavicle  Destruction of lesion  Destruction method: electrodesiccation and curettage   Timeout:  patient name, date of birth, surgical site, and procedure verified Anesthesia: the lesion was anesthetized in a standard fashion   Anesthetic:  1% lidocaine w/ epinephrine 1-100,000 buffered w/ 8.4% NaHCO3 Curettage performed in three different directions: Yes   Electrodesiccation performed over the curetted area: Yes   Lesion length (cm):  0.4 Lesion width (cm):  0.4 Margin per side (cm):  0.2 Final wound size (cm):  0.8 Hemostasis achieved with:  pressure, aluminum chloride and electrodesiccation Outcome: patient tolerated procedure well with no complications   Post-procedure details: wound care instructions given   Additional details:  Mupirocin ointment and Bandaid applied    Actinic Damage - chronic, secondary to cumulative UV radiation exposure/sun exposure over time - diffuse scaly erythematous macules with underlying dyspigmentation - Recommend daily broad spectrum sunscreen SPF 30+ to sun-exposed  areas, reapply every 2 hours as needed.  - Recommend staying in the shade or wearing long sleeves, sun glasses (UVA+UVB protection) and wide brim hats (4-inch brim around the entire circumference of the hat). - Call for new or changing lesions.    Return in about 6 months (around 06/07/2021) for [facility] recheck, UBSE.  I, Emelia Salisbury, CMA, am acting as scribe for Brendolyn Patty, MD.  Documentation: I have reviewed the above documentation for accuracy and completeness, and I agree with the above.  Brendolyn Patty MD

## 2020-12-07 NOTE — Patient Instructions (Signed)

## 2021-01-11 ENCOUNTER — Other Ambulatory Visit: Payer: Self-pay | Admitting: Internal Medicine

## 2021-01-11 DIAGNOSIS — Z1231 Encounter for screening mammogram for malignant neoplasm of breast: Secondary | ICD-10-CM

## 2021-02-22 ENCOUNTER — Other Ambulatory Visit: Payer: Self-pay

## 2021-02-22 ENCOUNTER — Ambulatory Visit
Admission: RE | Admit: 2021-02-22 | Discharge: 2021-02-22 | Disposition: A | Discharge status: Home / Self Care | Payer: Medicare Other | Source: Ambulatory Visit | Attending: Internal Medicine | Admitting: Internal Medicine

## 2021-02-22 DIAGNOSIS — Z1231 Encounter for screening mammogram for malignant neoplasm of breast: Secondary | ICD-10-CM | POA: Diagnosis not present

## 2021-02-27 ENCOUNTER — Other Ambulatory Visit: Payer: Self-pay | Admitting: Internal Medicine

## 2021-02-27 DIAGNOSIS — N63 Unspecified lump in unspecified breast: Secondary | ICD-10-CM

## 2021-02-27 DIAGNOSIS — R928 Other abnormal and inconclusive findings on diagnostic imaging of breast: Secondary | ICD-10-CM

## 2021-03-14 ENCOUNTER — Ambulatory Visit
Admission: RE | Admit: 2021-03-14 | Discharge: 2021-03-14 | Disposition: A | Discharge status: Home / Self Care | Payer: Medicare Other | Source: Ambulatory Visit | Attending: Internal Medicine | Admitting: Internal Medicine

## 2021-03-14 ENCOUNTER — Other Ambulatory Visit: Payer: Self-pay

## 2021-03-14 DIAGNOSIS — R928 Other abnormal and inconclusive findings on diagnostic imaging of breast: Secondary | ICD-10-CM | POA: Insufficient documentation

## 2021-03-14 DIAGNOSIS — N63 Unspecified lump in unspecified breast: Secondary | ICD-10-CM

## 2021-06-12 ENCOUNTER — Encounter: Payer: Self-pay | Admitting: Ophthalmology

## 2021-06-13 ENCOUNTER — Ambulatory Visit: Payer: Medicare Other | Admitting: Dermatology

## 2021-06-16 NOTE — Discharge Instructions (Signed)

## 2021-06-20 ENCOUNTER — Ambulatory Visit (INDEPENDENT_AMBULATORY_CARE_PROVIDER_SITE_OTHER): Payer: Medicare Other | Admitting: Dermatology

## 2021-06-20 DIAGNOSIS — D225 Melanocytic nevi of trunk: Secondary | ICD-10-CM

## 2021-06-20 DIAGNOSIS — L814 Other melanin hyperpigmentation: Secondary | ICD-10-CM

## 2021-06-20 DIAGNOSIS — Z85828 Personal history of other malignant neoplasm of skin: Secondary | ICD-10-CM

## 2021-06-20 DIAGNOSIS — L821 Other seborrheic keratosis: Secondary | ICD-10-CM

## 2021-06-20 DIAGNOSIS — Q828 Other specified congenital malformations of skin: Secondary | ICD-10-CM

## 2021-06-20 DIAGNOSIS — Z1283 Encounter for screening for malignant neoplasm of skin: Secondary | ICD-10-CM | POA: Diagnosis not present

## 2021-06-20 DIAGNOSIS — D229 Melanocytic nevi, unspecified: Secondary | ICD-10-CM

## 2021-06-20 DIAGNOSIS — L820 Inflamed seborrheic keratosis: Secondary | ICD-10-CM

## 2021-06-20 DIAGNOSIS — L578 Other skin changes due to chronic exposure to nonionizing radiation: Secondary | ICD-10-CM

## 2021-06-20 DIAGNOSIS — D1800 Hemangioma unspecified site: Secondary | ICD-10-CM

## 2021-06-20 NOTE — Patient Instructions (Signed)
Due to recent changes in healthcare laws, you may see results of your pathology and/or laboratory studies on MyChart before the doctors have had a chance to review them. We understand that in some cases there may be results that are confusing or concerning to you. Please understand that not all results are received at the same time and often the doctors may need to interpret multiple results in order to provide you with the best plan of care or course of treatment. Therefore, we ask that you please give us 2 business days to thoroughly review all your results before contacting the office for clarification. Should we see a critical lab result, you will be contacted sooner.   If You Need Anything After Your Visit  If you have any questions or concerns for your doctor, please call our main line at [phone] and press option 4 to reach your doctor's medical assistant. If no one answers, please leave a voicemail as directed and we will return your call as soon as possible. Messages left after 4 pm will be answered the following business day.   You may also send us a message via MyChart. We typically respond to MyChart messages within 1-2 business days.  For prescription refills, please ask your pharmacy to contact our office. Our fax number is [phone].  If you have an urgent issue when the clinic is closed that cannot wait until the next business day, you can page your doctor at the number below.    Please note that while we do our best to be available for urgent issues outside of office hours, we are not available 24/7.   If you have an urgent issue and are unable to reach us, you may choose to seek medical care at your doctor's office, retail clinic, urgent care center, or emergency room.  If you have a medical emergency, please immediately call 911 or go to the emergency department.  Pager Numbers  - Dr. Kowalski: [phone]  - Dr. Moye: [phone]  - Dr. Stewart:  [phone]  In the event of inclement weather, please call our main line at [phone] for an update on the status of any delays or closures.  Dermatology Medication Tips: Please keep the boxes that topical medications come in in order to help keep track of the instructions about where and how to use these. Pharmacies typically print the medication instructions only on the boxes and not directly on the medication tubes.   If your medication is too expensive, please contact our office at [phone] option 4 or send us a message through MyChart.   We are unable to tell what your co-pay for medications will be in advance as this is different depending on your insurance coverage. However, we may be able to find a substitute medication at lower cost or fill out paperwork to get insurance to cover a needed medication.   If a prior authorization is required to get your medication covered by your insurance company, please allow us 1-2 business days to complete this process.  Drug prices often vary depending on where the prescription is filled and some pharmacies may offer cheaper prices.  The website www.goodrx.com contains coupons for medications through different pharmacies. The prices here do not account for what the cost may be with help from insurance (it may be cheaper with your insurance), but the website can give you the price if you did not use any insurance.  - You can print the associated coupon and take it with   your prescription to the pharmacy.  - You may also stop by our office during regular business hours and pick up a GoodRx coupon card.  - If you need your prescription sent electronically to a different pharmacy, notify our office through [facility] MyChart or by phone at [phone] option 4.     Si Usted Necesita Algo Despus de Su Visita  Tambin puede enviarnos un mensaje a travs de MyChart. Por lo general respondemos a los mensajes de MyChart en el transcurso de 1 a 2  das hbiles.  Para renovar recetas, por favor pida a su farmacia que se ponga en contacto con nuestra oficina. Nuestro nmero de fax es el [phone].  Si tiene un asunto urgente cuando la clnica est cerrada y que no puede esperar hasta el siguiente da hbil, puede llamar/localizar a su doctor(a) al nmero que aparece a continuacin.   Por favor, tenga en cuenta que aunque hacemos todo lo posible para estar disponibles para asuntos urgentes fuera del horario de oficina, no estamos disponibles las 24 horas del da, los 7 das de la semana.   Si tiene un problema urgente y no puede comunicarse con nosotros, puede optar por buscar atencin mdica  en el consultorio de su doctor(a), en una clnica privada, en un centro de atencin urgente o en una sala de emergencias.  Si tiene una emergencia mdica, por favor llame inmediatamente al 911 o vaya a la sala de emergencias.  Nmeros de bper  - Dr. Kowalski: [phone]  - Dra. Moye: [phone]  - Dra. Stewart: [phone]  En caso de inclemencias del tiempo, por favor llame a nuestra lnea principal al [phone] para una actualizacin sobre el estado de cualquier retraso o cierre.  Consejos para la medicacin en dermatologa: Por favor, guarde las cajas en las que vienen los medicamentos de uso tpico para ayudarle a seguir las instrucciones sobre dnde y cmo usarlos. Las farmacias generalmente imprimen las instrucciones del medicamento slo en las cajas y no directamente en los tubos del medicamento.   Si su medicamento es muy caro, por favor, pngase en contacto con nuestra oficina llamando al [phone] y presione la opcin 4 o envenos un mensaje a travs de MyChart.   No podemos decirle cul ser su copago por los medicamentos por adelantado ya que esto es diferente dependiendo de la cobertura de su seguro. Sin embargo, es posible que podamos encontrar un medicamento sustituto a menor costo o llenar un formulario para que el  seguro cubra el medicamento que se considera necesario.   Si se requiere una autorizacin previa para que su compaa de seguros cubra su medicamento, por favor permtanos de 1 a 2 das hbiles para completar este proceso.  Los precios de los medicamentos varan con frecuencia dependiendo del lugar de dnde se surte la receta y alguna farmacias pueden ofrecer precios ms baratos.  El sitio web www.goodrx.com tiene cupones para medicamentos de diferentes farmacias. Los precios aqu no tienen en cuenta lo que podra costar con la ayuda del seguro (puede ser ms barato con su seguro), pero el sitio web puede darle el precio si no utiliz ningn seguro.  - Puede imprimir el cupn correspondiente y llevarlo con su receta a la farmacia.  - Tambin puede pasar por nuestra oficina durante el horario de atencin regular y recoger una tarjeta de cupones de GoodRx.  - Si necesita que su receta se enve electrnicamente a una farmacia diferente, informe a nuestra oficina a travs de MyChart de [place]   o por telfono llamando al [phone] y presione la opcin 4.  

## 2021-06-20 NOTE — Progress Notes (Signed)
Follow-Up Visit   Subjective  Karen Ramirez is a 71 y.o. female who presents for the following: UBSE (The patient presents for Upper Body Skin Exam (UBSE) for skin cancer screening and mole check.  The patient has spots, moles and lesions to be evaluated, some may be new or changing and the patient has concerns that these could be cancer. Patient also here to have Friedens at left clavicle treated with EDC rechecked. ).  Patient does have a new spot at right lower leg to be checked. She was hit with a softball at left lower leg about 6 weeks ago and area does not seem to be healing as quickly as she thought it should.    The following portions of the chart were reviewed this encounter and updated as appropriate:       Review of Systems:  No other skin or systemic complaints except as noted in HPI or Assessment and Plan.  Objective  Well appearing patient in no apparent distress; mood and affect are within normal limits.  A focused examination was performed including face, arms, back, chest, lower legs. Relevant physical exam findings are noted in the Assessment and Plan.  left pretibia Stuck-on, waxy, tan-brown macules and papules-- Discussed benign etiology and prognosis.   left lower pretibia Pink waxy patch with keratotic rim 1.5 cm  Right upper back x 2, right mid eyebrow, left upper forehead, right upper sternum Erythematous stuck-on, waxy papule or plaque  right lower back medial, right lower back lateral 0.6 x 0.2 cm brown macule, darker edge at right lower back medial  0.6 x 0.3 cm medium brown macule at right lower back lateral    Assessment & Plan  Seborrheic keratosis left pretibia  Reassured benign age-related growth.  Recommend observation.  Discussed cryotherapy if spot(s) become irritated or inflamed.  Porokeratosis left lower pretibia  Benign-appearing.  Observation.  Call clinic for new or changing lesions.  Recommend daily use of broad spectrum spf 30+  sunscreen to sun-exposed areas.    Inflamed seborrheic keratosis Right upper back x 2, right mid eyebrow, left upper forehead, right upper sternum  Symptomatic, irritating, patient would like treated.  Vs AK at right mid eyebrow, left upper forehead, right upper sternum  Patient will RTC to treat, having cataract surgery tomorrow.    Nevus right lower back medial, right lower back lateral  Benign-appearing.  Observation.  Call clinic for new or changing moles.  Recommend daily use of broad spectrum spf 30+ sunscreen to sun-exposed areas.    Recheck on f/up  History of Basal Cell Carcinoma of the Skin - No evidence of recurrence today at left clavicle - Recommend regular full body skin exams - Recommend daily broad spectrum sunscreen SPF 30+ to sun-exposed areas, reapply every 2 hours as needed.  - Call if any new or changing lesions are noted between office visits  Lentigines - Scattered tan macules - Due to sun exposure - Benign-appering, observe - Recommend daily broad spectrum sunscreen SPF 30+ to sun-exposed areas, reapply every 2 hours as needed. - Call for any changes  Hemangiomas - Red papules - Discussed benign nature - Observe - Call for any changes  Seborrheic Keratoses - Stuck-on, waxy, tan-brown papules and/or plaques  - Benign-appearing - Discussed benign etiology and prognosis. - Observe - Call for any changes  Actinic Damage - chronic, secondary to cumulative UV radiation exposure/sun exposure over time - diffuse scaly erythematous macules with underlying dyspigmentation - Recommend daily broad spectrum  sunscreen SPF 30+ to sun-exposed areas, reapply every 2 hours as needed.  - Recommend staying in the shade or wearing long sleeves, sun glasses (UVA+UVB protection) and wide brim hats (4-inch brim around the entire circumference of the hat). - Call for new or changing lesions.  Return in about 3 months (around 09/20/2021) for treat ISK's.  Graciella Belton, RMA, am acting as scribe for Brendolyn Patty, MD .  Documentation: I have reviewed the above documentation for accuracy and completeness, and I agree with the above.  Brendolyn Patty MD

## 2021-06-21 ENCOUNTER — Ambulatory Visit
Admission: RE | Admit: 2021-06-21 | Discharge: 2021-06-21 | Disposition: A | Discharge status: Home / Self Care | Payer: Medicare Other | Source: Ambulatory Visit | Attending: Ophthalmology | Admitting: Ophthalmology

## 2021-06-21 ENCOUNTER — Encounter: Payer: Self-pay | Admitting: Ophthalmology

## 2021-06-21 ENCOUNTER — Encounter
Admission: RE | Disposition: A | Discharge status: Home / Self Care | Payer: Self-pay | Source: Ambulatory Visit | Attending: Ophthalmology

## 2021-06-21 ENCOUNTER — Ambulatory Visit: Payer: Medicare Other | Admitting: Anesthesiology

## 2021-06-21 ENCOUNTER — Other Ambulatory Visit: Payer: Self-pay

## 2021-06-21 DIAGNOSIS — I10 Essential (primary) hypertension: Secondary | ICD-10-CM | POA: Insufficient documentation

## 2021-06-21 DIAGNOSIS — H2511 Age-related nuclear cataract, right eye: Secondary | ICD-10-CM | POA: Diagnosis present

## 2021-06-21 DIAGNOSIS — K219 Gastro-esophageal reflux disease without esophagitis: Secondary | ICD-10-CM | POA: Diagnosis not present

## 2021-06-21 DIAGNOSIS — E039 Hypothyroidism, unspecified: Secondary | ICD-10-CM | POA: Insufficient documentation

## 2021-06-21 HISTORY — DX: Presence of external hearing-aid: Z97.4

## 2021-06-21 SURGERY — PHACOEMULSIFICATION, CATARACT, WITH IOL INSERTION
Anesthesia: Monitor Anesthesia Care | Site: Eye | Laterality: Right

## 2021-06-21 MED ORDER — ACETAMINOPHEN 325 MG PO TABS: 325.0000 mg | ORAL_TABLET | ORAL | Status: DC | PRN

## 2021-06-21 MED ORDER — BRIMONIDINE TARTRATE-TIMOLOL 0.2-0.5 % OP SOLN
OPHTHALMIC | Status: DC | PRN
  Administered 2021-06-21: 1 [drp] via OPHTHALMIC

## 2021-06-21 MED ORDER — ONDANSETRON HCL 4 MG/2ML IJ SOLN: 4.0000 mg | Freq: Once | INTRAMUSCULAR | Status: DC | PRN

## 2021-06-21 MED ORDER — SIGHTPATH DOSE#1 BSS IO SOLN
INTRAOCULAR | Status: DC | PRN
  Administered 2021-06-21: 55 mL via OPHTHALMIC

## 2021-06-21 MED ORDER — SIGHTPATH DOSE#1 BSS IO SOLN
INTRAOCULAR | Status: DC | PRN
  Administered 2021-06-21: 1 mL via INTRAMUSCULAR

## 2021-06-21 MED ORDER — ARMC OPHTHALMIC DILATING DROPS
1.0000 "application " | OPHTHALMIC | Status: DC | PRN
  Administered 2021-06-21 (×3): 1 via OPHTHALMIC

## 2021-06-21 MED ORDER — MIDAZOLAM HCL 2 MG/2ML IJ SOLN
INTRAMUSCULAR | Status: DC | PRN
  Administered 2021-06-21: 1 mg via INTRAVENOUS

## 2021-06-21 MED ORDER — SIGHTPATH DOSE#1 NA HYALUR & NA CHOND-NA HYALUR IO KIT
PACK | INTRAOCULAR | Status: DC | PRN
  Administered 2021-06-21: 1 via OPHTHALMIC

## 2021-06-21 MED ORDER — TETRACAINE HCL 0.5 % OP SOLN
1.0000 [drp] | OPHTHALMIC | Status: DC | PRN
  Administered 2021-06-21 (×3): 1 [drp] via OPHTHALMIC

## 2021-06-21 MED ORDER — CEFUROXIME OPHTHALMIC INJECTION 1 MG/0.1 ML
INJECTION | OPHTHALMIC | Status: DC | PRN
  Administered 2021-06-21: 0.1 mL via INTRACAMERAL

## 2021-06-21 MED ORDER — SIGHTPATH DOSE#1 BSS IO SOLN
INTRAOCULAR | Status: DC | PRN
  Administered 2021-06-21: 15 mL

## 2021-06-21 MED ORDER — ACETAMINOPHEN 160 MG/5ML PO SOLN: 325.0000 mg | ORAL | Status: DC | PRN

## 2021-06-21 MED ORDER — FENTANYL CITRATE (PF) 100 MCG/2ML IJ SOLN
INTRAMUSCULAR | Status: DC | PRN
  Administered 2021-06-21: 50 ug via INTRAVENOUS

## 2021-06-21 SURGICAL SUPPLY — 13 items
CATARACT SUITE SIGHTPATH (MISCELLANEOUS) ×2 IMPLANT
FEE CATARACT SUITE SIGHTPATH (MISCELLANEOUS) ×1 IMPLANT
GLOVE SRG 8 PF TXTR STRL LF DI (GLOVE) ×1 IMPLANT
GLOVE SURG ENC TEXT LTX SZ7.5 (GLOVE) ×2 IMPLANT
GLOVE SURG UNDER POLY LF SZ8 (GLOVE) ×2
LENS IOL EYHANCE TORIC II 16.0 ×2 IMPLANT
LENS IOL EYHANCE TRC 225 16.0 IMPLANT
LENS IOL EYHNC TORIC 225 16.0 ×1 IMPLANT
NDL FILTER BLUNT 18X1 1/2 (NEEDLE) ×1 IMPLANT
NEEDLE FILTER BLUNT 18X 1/2SAF (NEEDLE) ×1
NEEDLE FILTER BLUNT 18X1 1/2 (NEEDLE) ×1 IMPLANT
SYR 3ML LL SCALE MARK (SYRINGE) ×2 IMPLANT
WATER STERILE IRR 250ML POUR (IV SOLUTION) ×2 IMPLANT

## 2021-06-21 NOTE — H&P (Signed)
[facility]   Primary Care Physician:  Adin Hector, MD Ophthalmologist: Dr. Leandrew Koyanagi  Pre-Procedure History & Physical: HPI:  Karen Ramirez is a 71 y.o. female here for ophthalmic surgery.   Past Medical History:  Diagnosis Date   Arthritis    Basal cell carcinoma 11/09/2020   L clavicle, EDC 40/98/11   Complication of anesthesia    DDD (degenerative disc disease), lumbar    Dermatitis    Diverticulosis    DVT, bilateral lower limbs ([place]) 05/2020   after back surgery   Dysrhythmia    pt states "occassionally"    GERD (gastroesophageal reflux disease)    Hemorrhoids    History of kidney stones    Hypercholesteremia    Hyperglycemia    Hypothyroidism    Leg edema    Osteoporosis    PE (pulmonary thromboembolism) ([place]) 05/2020   after back surgery   PMR (polymyalgia rheumatica) (HCC)    PONV (postoperative nausea and vomiting)    Renal stone    Vitamin D deficiency    Wears glasses    Wears hearing aid in both ears     Past Surgical History:  Procedure Laterality Date   ABDOMINAL HYSTERECTOMY  1986   APPENDECTOMY     BACK SURGERY     BILATERAL CARPAL TUNNEL RELEASE  2014   BLEPHAROPLASTY  2016   both eyes   COLONOSCOPY     ESOPHAGOGASTRODUODENOSCOPY     IR ANGIOGRAM PULMONARY BILATERAL SELECTIVE  05/21/2020   IR ANGIOGRAM SELECTIVE EACH ADDITIONAL VESSEL  05/21/2020   IR ANGIOGRAM SELECTIVE EACH ADDITIONAL VESSEL  05/21/2020   IR THROMBECT PRIM MECH INIT (INCLU) MOD SED  05/21/2020   IR THROMBECT PRIM MECH INIT (INCLU) MOD SED  05/21/2020   IR US GUIDE VASC ACCESS RIGHT  05/21/2020   IR VENOCAVAGRAM SVC  05/21/2020   LUMBAR LAMINECTOMY  1989   LUMBAR LAMINECTOMY WITH COFLEX 2 LEVEL N/A 04/12/2015   Procedure: Lumbar two- three,  Lumbar three- four Laminectomy with Coflex;  Surgeon: Kristeen Miss, MD;  Location: [facility];  Service: Neurosurgery;  Laterality: N/A;  L2-3 L3-4 Laminectomy with Coflex   POSTERIOR LUMBAR FUSION 4 LEVEL N/A  05/14/2020   Procedure: Revision of fixation from Lumbar Two to lium with decompression of Lumbar Four vertebral fracture;  Surgeon: Kristeen Miss, MD;  Location: [place];  Service: Neurosurgery;  Laterality: N/A;   RADIOLOGY WITH ANESTHESIA N/A 05/21/2020   Procedure: IR WITH ANESTHESIA;  Surgeon: Criselda Peaches, MD;  Location: [place];  Service: Radiology;  Laterality: N/A;   SHOULDER ARTHROSCOPY WITH BICEPSTENOTOMY Right 05/10/2014   Procedure: SHOULDER ARTHROSCOPY WITH BICEPSTENOTOMY;  Surgeon: Tania Ade, MD;  Location: [place];  Service: Orthopedics;  Laterality: Right;   SHOULDER ARTHROSCOPY WITH DISTAL CLAVICLE RESECTION Right 05/10/2014   Procedure: SHOULDER ARTHROSCOPY WITH DISTAL CLAVICLE RESECTION DEBRIDEMENT OF ROTATOR CUFF TEAR;  Surgeon: Tania Ade, MD;  Location: [place];  Service: Orthopedics;  Laterality: Right;   SHOULDER ARTHROSCOPY WITH SUBACROMIAL DECOMPRESSION Right 05/10/2014   Procedure: SHOULDER ARTHROSCOPY WITH SUBACROMIAL DECOMPRESSION;  Surgeon: Tania Ade, MD;  Location: [place];  Service: Orthopedics;  Laterality: Right;   THUMB ARTHROSCOPY  3/16   left   TONSILLECTOMY     VEIN LIGATION     left leg    Prior to Admission medications   Medication Sig Start Date End Date Taking? Authorizing Provider  aspirin EC (ASPIRIN 81) 81 MG  tablet Take 81 mg by mouth daily. Swallow whole.   Yes [provider]  carboxymethylcellulose (REFRESH PLUS) 0.5 % SOLN Place 1 drop into both eyes at bedtime.   Yes [provider]  Cholecalciferol (VITAMIN D3) 125 MCG (5000 UT) TABS Take 5,000 Units by mouth every other day.   Yes [provider]  famotidine (PEPCID) 20 MG tablet Take 20 mg by mouth 2 (two) times daily before a meal.   Yes [provider]  folic acid (FOLVITE) 1 MG tablet Take 2 mg by mouth daily. 03/24/20  Yes [provider]  hydrochlorothiazide (HYDRODIURIL)  12.5 MG tablet Take 12.5 mg by mouth daily. 01/12/20  Yes [provider]  levothyroxine (SYNTHROID, LEVOTHROID) 75 MCG tablet Take 75 mcg by mouth daily before breakfast.   Yes [provider]  methocarbamol (ROBAXIN) 500 MG tablet Take 1 tablet (500 mg total) by mouth every 6 (six) hours as needed for muscle spasms. 05/19/20  Yes Kristeen Miss, MD  mometasone (ELOCON) 0.1 % cream Apply to affected areas rash once to twice daily until improved. Avoid face, groin, axilla. 10/25/20  Yes Brendolyn Patty, MD  pregabalin (LYRICA) 75 MG capsule Take 1 capsule (75 mg total) by mouth 3 (three) times daily. 05/19/20  Yes Kristeen Miss, MD  simvastatin (ZOCOR) 20 MG tablet Take 20 mg by mouth at bedtime.    Yes [provider]  triamcinolone (NASACORT) 55 MCG/ACT AERO nasal inhaler Place 1 spray into the nose at bedtime.   Yes [provider]    Allergies as of 04/17/2021 - Review Complete 12/07/2020  Allergen Reaction Noted   Azithromycin Diarrhea 08/02/2017   Hydrocortisone Itching 11/09/2020   Levaquin [levofloxacin] Nausea And Vomiting 05/05/2014   Tixocortol Itching 11/09/2020   Ultram [tramadol] Nausea And Vomiting 05/19/2014    Family History  Problem Relation Age of Onset   Breast cancer Maternal Aunt 70   Breast cancer Maternal Aunt 70    Social History   Socioeconomic History   Marital status: Married    Spouse name: Not on file   Number of children: Not on file   Years of education: Not on file   Highest education level: Not on file  Occupational History   Not on file  Tobacco Use   Smoking status: Never   Smokeless tobacco: Never  Vaping Use   Vaping Use: Never used  Substance and Sexual Activity   Alcohol use: Yes    Comment: rare   Drug use: No   Sexual activity: Not on file  Other Topics Concern   Not on file  Social History Narrative   Not on file   Social Determinants of Health   Financial Resource Strain: Not on file  Food  Insecurity: Not on file  Transportation Needs: Not on file  Physical Activity: Not on file  Stress: Not on file  Social Connections: Not on file  Intimate Partner Violence: Not on file    Review of Systems: See HPI, otherwise negative ROS  Physical Exam: BP 132/82   Pulse 73   Temp (!) 97.2 F (36.2 C) (Temporal)   Resp 20   Ht 5' 2.5" (1.588 m)   Wt 78 kg   SpO2 95%   BMI 30.96 kg/m  General:   Alert,  pleasant and cooperative in NAD Head:  Normocephalic and atraumatic. Lungs:  Clear to auscultation.    Heart:  Regular rate and rhythm.   Impression/Plan: Karen Ramirez is here for  ophthalmic surgery.  Risks, benefits, limitations, and alternatives regarding ophthalmic surgery have been reviewed with the patient.  Questions have been answered.  All parties agreeable.   Leandrew Koyanagi, MD  06/21/2021, 10:14 AM

## 2021-06-21 NOTE — Transfer of Care (Signed)
Immediate Anesthesia Transfer of Care Note  Patient: Karen Ramirez  Procedure(s) Performed: CATARACT EXTRACTION PHACO AND INTRAOCULAR LENS PLACEMENT (IOC) RIGHT TORIC LENS 6.50 01:00.6 (Right: Eye)  Patient Location: PACU  Anesthesia Type: MAC  Level of Consciousness: awake, alert  and patient cooperative  Airway and Oxygen Therapy: Patient Spontanous Breathing and Patient connected to supplemental oxygen  Post-op Assessment: Post-op Vital signs reviewed, Patient's Cardiovascular Status Stable, Respiratory Function Stable, Patent Airway and No signs of Nausea or vomiting  Post-op Vital Signs: Reviewed and stable  Complications: No notable events documented.

## 2021-06-21 NOTE — Anesthesia Postprocedure Evaluation (Signed)
Anesthesia Post Note  Patient: Karen Ramirez  Procedure(s) Performed: CATARACT EXTRACTION PHACO AND INTRAOCULAR LENS PLACEMENT (IOC) RIGHT TORIC LENS 6.50 01:00.6 (Right: Eye)     Patient location during evaluation: PACU Anesthesia Type: MAC Level of consciousness: awake Pain management: pain level controlled Vital Signs Assessment: post-procedure vital signs reviewed and stable Respiratory status: respiratory function stable Cardiovascular status: stable Postop Assessment: no apparent nausea or vomiting Anesthetic complications: no   No notable events documented.  Veda Canning

## 2021-06-21 NOTE — Op Note (Signed)
LOCATION:  [place]   PREOPERATIVE DIAGNOSIS:  Nuclear sclerotic cataract of the right eye.  H25.11   POSTOPERATIVE DIAGNOSIS:  Nuclear sclerotic cataract of the right eye.   PROCEDURE:  Phacoemulsification with Toric posterior chamber intraocular lens placement of the right eye.  Ultrasound time: Procedure(s): CATARACT EXTRACTION PHACO AND INTRAOCULAR LENS PLACEMENT (IOC) RIGHT TORIC LENS 6.50 01:00.6 (Right)  LENS:   Implant Name Type Inv. Item Serial No. Manufacturer Lot No. LRB No. Used Action  LENS IOL EYHANCE TORIC II 16.0 - [id number]  LENS IOL EYHANCE TORIC II 16.0 [id number] [employer]  Right 1 Implanted     [id number]  Toric intraocular lens with 2.25 diopters of cylindrical power with axis orientation at 150 degrees.   SURGEON:  Wyonia Hough, MD   ANESTHESIA: Topical with tetracaine drops and 2% Xylocaine jelly, augmented with 1% preservative-free intracameral lidocaine. .   COMPLICATIONS:  None.   DESCRIPTION OF PROCEDURE:  The patient was identified in the holding room and transported to the operating suite and placed in the supine position under the operating microscope.  The right eye was identified as the operative eye, and it was prepped and draped in the usual sterile ophthalmic fashion.    A clear-corneal paracentesis incision was made at the 12:00 position.  0.5 ml of preservative-free 1% lidocaine was injected into the anterior chamber. The anterior chamber was filled with Viscoat.  A 2.4 millimeter near clear corneal incision was then made at the 9:00 position.  A cystotome and capsulorrhexis forceps were then used to make a curvilinear capsulorrhexis.  Hydrodissection and hydrodelineation were then performed using balanced salt solution.   Phacoemulsification was then used in stop and chop fashion to remove the lens, nucleus and epinucleus.  The remaining cortex was aspirated using the irrigation and aspiration handpiece.  Provisc viscoelastic was  then placed into the capsular bag to distend it for lens placement.  The Verion digital marker was used to align the implant at the intended axis.   A Toric lens was then injected into the capsular bag.  It was rotated clockwise until the axis marks on the lens were approximately 15 degrees in the counterclockwise direction to the intended alignment.  The viscoelastic was aspirated from the eye using the irrigation aspiration handpiece.  Then, a Koch spatula through the sideport incision was used to rotate the lens in a clockwise direction until the axis markings of the intraocular lens were lined up with the Verion alignment.  Balanced salt solution was then used to hydrate the wounds. Cefuroxime 0.1 ml of a '10mg'$ /ml solution was injected into the anterior chamber for a dose of 1 mg of intracameral antibiotic at the completion of the case.    The eye was noted to have a physiologic pressure and there was no wound leak noted.   Timolol and Brimonidine drops were applied to the eye.  The patient was taken to the recovery room in stable condition having had no complications of anesthesia or surgery.  Raye Wiens 06/21/2021, 11:03 AM

## 2021-06-21 NOTE — Anesthesia Preprocedure Evaluation (Addendum)
Anesthesia Evaluation  Patient identified by MRN, date of birth, ID band Patient awake    Reviewed: Allergy & Precautions, NPO status   History of Anesthesia Complications (+) PONV and history of anesthetic complications  Airway Mallampati: II  TM Distance: >3 FB Neck ROM: Full    Dental   Pulmonary PE   breath sounds clear to auscultation + decreased breath sounds      Cardiovascular hypertension,  Rhythm:Regular Rate:Normal     Neuro/Psych    GI/Hepatic GERD  ,diverticulosis   Endo/Other  Hypothyroidism BMI > 30   Renal/GU      Musculoskeletal  (+) Arthritis , Osteoarthritis,    Abdominal   Peds  Hematology   Anesthesia Other Findings   Reproductive/Obstetrics                            Anesthesia Physical  Anesthesia Plan  ASA: 3  Anesthesia Plan: MAC   Post-op Pain Management:    Induction: Intravenous  PONV Risk Score and Plan: 3 and Treatment may vary due to age or medical condition, Midazolam and TIVA  Airway Management Planned: Nasal Cannula  Additional Equipment: Arterial line  Intra-op Plan:   Post-operative Plan:   Informed Consent: I have reviewed the patients History and Physical, chart, labs and discussed the procedure including the risks, benefits and alternatives for the proposed anesthesia with the patient or authorized representative who has indicated his/her understanding and acceptance.       Plan Discussed with: CRNA  Anesthesia Plan Comments:         Anesthesia Quick Evaluation

## 2021-06-22 ENCOUNTER — Encounter: Payer: Self-pay | Admitting: Ophthalmology

## 2021-06-27 ENCOUNTER — Encounter: Payer: Self-pay | Admitting: Ophthalmology

## 2021-07-10 NOTE — Discharge Instructions (Signed)

## 2021-07-12 ENCOUNTER — Other Ambulatory Visit: Payer: Self-pay

## 2021-07-12 ENCOUNTER — Encounter: Payer: Self-pay | Admitting: Ophthalmology

## 2021-07-12 ENCOUNTER — Ambulatory Visit: Payer: Medicare Other | Admitting: Anesthesiology

## 2021-07-12 ENCOUNTER — Encounter
Admission: RE | Disposition: A | Discharge status: Home / Self Care | Payer: Self-pay | Source: Home / Self Care | Attending: Ophthalmology

## 2021-07-12 ENCOUNTER — Ambulatory Visit
Admission: RE | Admit: 2021-07-12 | Discharge: 2021-07-12 | Disposition: A | Discharge status: Home / Self Care | Payer: Medicare Other | Attending: Ophthalmology | Admitting: Ophthalmology

## 2021-07-12 DIAGNOSIS — H2512 Age-related nuclear cataract, left eye: Secondary | ICD-10-CM | POA: Insufficient documentation

## 2021-07-12 HISTORY — PX: CATARACT EXTRACTION W/PHACO: SHX586

## 2021-07-12 SURGERY — PHACOEMULSIFICATION, CATARACT, WITH IOL INSERTION
Anesthesia: Monitor Anesthesia Care | Site: Eye | Laterality: Left

## 2021-07-12 MED ORDER — SIGHTPATH DOSE#1 BSS IO SOLN
INTRAOCULAR | Status: DC | PRN
  Administered 2021-07-12: 2 mL

## 2021-07-12 MED ORDER — MIDAZOLAM HCL 2 MG/2ML IJ SOLN
INTRAMUSCULAR | Status: DC | PRN
  Administered 2021-07-12: 1 mg via INTRAVENOUS

## 2021-07-12 MED ORDER — SIGHTPATH DOSE#1 BSS IO SOLN
INTRAOCULAR | Status: DC | PRN
  Administered 2021-07-12: 79 mL via OPHTHALMIC

## 2021-07-12 MED ORDER — ACETAMINOPHEN 160 MG/5ML PO SOLN: 975.0000 mg | Freq: Once | ORAL | Status: DC | PRN

## 2021-07-12 MED ORDER — SIGHTPATH DOSE#1 NA HYALUR & NA CHOND-NA HYALUR IO KIT
PACK | INTRAOCULAR | Status: DC | PRN
  Administered 2021-07-12: 1 via OPHTHALMIC

## 2021-07-12 MED ORDER — ACETAMINOPHEN 500 MG PO TABS: 1000.0000 mg | ORAL_TABLET | Freq: Once | ORAL | Status: DC | PRN

## 2021-07-12 MED ORDER — ARMC OPHTHALMIC DILATING DROPS
1.0000 "application " | OPHTHALMIC | Status: DC | PRN
  Administered 2021-07-12 (×3): 1 via OPHTHALMIC

## 2021-07-12 MED ORDER — BRIMONIDINE TARTRATE-TIMOLOL 0.2-0.5 % OP SOLN
OPHTHALMIC | Status: DC | PRN
  Administered 2021-07-12: 1 [drp] via OPHTHALMIC

## 2021-07-12 MED ORDER — SIGHTPATH DOSE#1 BSS IO SOLN
INTRAOCULAR | Status: DC | PRN
  Administered 2021-07-12: 15 mL via INTRAOCULAR

## 2021-07-12 MED ORDER — CEFUROXIME OPHTHALMIC INJECTION 1 MG/0.1 ML
INJECTION | OPHTHALMIC | Status: DC | PRN
  Administered 2021-07-12: 0.1 mL via INTRACAMERAL

## 2021-07-12 MED ORDER — FENTANYL CITRATE (PF) 100 MCG/2ML IJ SOLN
INTRAMUSCULAR | Status: DC | PRN
Start: 2021-07-12 — End: 2021-07-12
  Administered 2021-07-12: 50 ug via INTRAVENOUS

## 2021-07-12 MED ORDER — TETRACAINE HCL 0.5 % OP SOLN
1.0000 [drp] | OPHTHALMIC | Status: DC | PRN
  Administered 2021-07-12 (×3): 1 [drp] via OPHTHALMIC

## 2021-07-12 MED ORDER — ONDANSETRON HCL 4 MG/2ML IJ SOLN: 4.0000 mg | Freq: Once | INTRAMUSCULAR | Status: DC | PRN

## 2021-07-12 MED ORDER — LACTATED RINGERS IV SOLN: INTRAVENOUS | Status: DC

## 2021-07-12 SURGICAL SUPPLY — 15 items
CANNULA ANT/CHMB 27G (MISCELLANEOUS) IMPLANT
CANNULA ANT/CHMB 27GA (MISCELLANEOUS) IMPLANT
CATARACT SUITE SIGHTPATH (MISCELLANEOUS) ×2 IMPLANT
FEE CATARACT SUITE SIGHTPATH (MISCELLANEOUS) ×1 IMPLANT
GLOVE SRG 8 PF TXTR STRL LF DI (GLOVE) ×1 IMPLANT
GLOVE SURG ENC TEXT LTX SZ7.5 (GLOVE) ×2 IMPLANT
GLOVE SURG UNDER POLY LF SZ8 (GLOVE) ×2
LENS IOL EYHANCE TORIC II 17.5 ×2 IMPLANT
LENS IOL EYHANCE TRC 300 17.5 IMPLANT
LENS IOL EYHNC TORIC 300 17.5 ×1 IMPLANT
NDL FILTER BLUNT 18X1 1/2 (NEEDLE) ×1 IMPLANT
NEEDLE FILTER BLUNT 18X 1/2SAF (NEEDLE) ×1
NEEDLE FILTER BLUNT 18X1 1/2 (NEEDLE) ×1 IMPLANT
SYR 3ML LL SCALE MARK (SYRINGE) ×2 IMPLANT
WATER STERILE IRR 250ML POUR (IV SOLUTION) ×2 IMPLANT

## 2021-07-12 NOTE — Anesthesia Procedure Notes (Signed)
Procedure Name: MAC Date/Time: 07/12/2021 1:46 PM  Performed by: Mayme Genta, CRNAPre-anesthesia Checklist: Patient identified, Emergency Drugs available, Suction available, Timeout performed and Patient being monitored Patient Re-evaluated:Patient Re-evaluated prior to induction Oxygen Delivery Method: Nasal cannula Placement Confirmation: positive ETCO2

## 2021-07-12 NOTE — Transfer of Care (Signed)
Immediate Anesthesia Transfer of Care Note  Patient: Karen Ramirez  Procedure(s) Performed: CATARACT EXTRACTION PHACO AND INTRAOCULAR LENS PLACEMENT (IOC) LEFT TORIC LENS 5.27 01:03.2 (Left: Eye)  Patient Location: PACU  Anesthesia Type: MAC  Level of Consciousness: awake, alert  and patient cooperative  Airway and Oxygen Therapy: Patient Spontanous Breathing and Patient connected to supplemental oxygen  Post-op Assessment: Post-op Vital signs reviewed, Patient's Cardiovascular Status Stable, Respiratory Function Stable, Patent Airway and No signs of Nausea or vomiting  Post-op Vital Signs: Reviewed and stable  Complications: No notable events documented.

## 2021-07-12 NOTE — Op Note (Signed)
LOCATION:  [place]   PREOPERATIVE DIAGNOSIS:  Nuclear sclerotic cataract of the left eye.  H25.12  POSTOPERATIVE DIAGNOSIS:  Nuclear sclerotic cataract of the left eye.   PROCEDURE:  Phacoemulsification with Toric posterior chamber intraocular lens placement of the left eye.  Ultrasound time: Procedure(s): CATARACT EXTRACTION PHACO AND INTRAOCULAR LENS PLACEMENT (IOC) LEFT TORIC LENS 5.27 01:03.2 (Left)  LENS:   Implant Name Type Inv. Item Serial No. Manufacturer Lot No. LRB No. Used Action  LENS IOL EYHANCE TORIC II 17.5 - [id number]  LENS IOL EYHANCE TORIC II 17.5 [id number] [employer]  Left 1 Implanted     [id number] Toric intraocular lens with 3.0 diopters of cylindrical power with axis orientation at 8 degrees.     SURGEON:  Wyonia Hough, MD   ANESTHESIA:  Topical with tetracaine drops and 2% Xylocaine jelly, augmented with 1% preservative-free intracameral lidocaine.  COMPLICATIONS:  None.   DESCRIPTION OF PROCEDURE:  The patient was identified in the holding room and transported to the operating suite and placed in the supine position under the operating microscope.  The left eye was identified as the operative eye, and it was prepped and draped in the usual sterile ophthalmic fashion.    A clear-corneal paracentesis incision was made at the 1:30 position.  0.5 ml of preservative-free 1% lidocaine was injected into the anterior chamber. The anterior chamber was filled with Viscoat.  A 2.4 millimeter near clear corneal incision was then made at the 10:30 position.  A cystotome and capsulorrhexis forceps were then used to make a curvilinear capsulorrhexis.  Hydrodissection and hydrodelineation were then performed using balanced salt solution.   Phacoemulsification was then used in stop and chop fashion to remove the lens, nucleus and epinucleus.  The remaining cortex was aspirated using the irrigation and aspiration handpiece.  Provisc viscoelastic was then placed  into the capsular bag to distend it for lens placement.  The Verion digital marker was used to align the implant at the intended axis.   A Toric lens was then injected into the capsular bag.  It was rotated clockwise until the axis marks on the lens were approximately 15 degrees in the counterclockwise direction to the intended alignment.  The viscoelastic was aspirated from the eye using the irrigation aspiration handpiece.  Then, a Koch spatula through the sideport incision was used to rotate the lens in a clockwise direction until the axis markings of the intraocular lens were lined up with the Verion alignment.  Balanced salt solution was then used to hydrate the wounds. Cefuroxime 0.1 ml of a '10mg'$ /ml solution was injected into the anterior chamber for a dose of 1 mg of intracameral antibiotic at the completion of the case.    The eye was noted to have a physiologic pressure and there was no wound leak noted.   Timolol and Brimonidine drops were applied to the eye.  The patient was taken to the recovery room in stable condition having had no complications of anesthesia or surgery.  Karen Ramirez 07/12/2021, 1:59 PM

## 2021-07-12 NOTE — H&P (Signed)
[facility]   Primary Care Physician:  Adin Hector, MD Ophthalmologist: Dr. Leandrew Koyanagi  Pre-Procedure History & Physical: HPI:  Karen Ramirez is a 71 y.o. female here for ophthalmic surgery.   Past Medical History:  Diagnosis Date   Arthritis    Basal cell carcinoma 11/09/2020   L clavicle, EDC 42/68/34   Complication of anesthesia    DDD (degenerative disc disease), lumbar    Dermatitis    Diverticulosis    DVT, bilateral lower limbs ([place]) 05/2020   after back surgery   Dysrhythmia    pt states "occassionally"    GERD (gastroesophageal reflux disease)    Hemorrhoids    History of kidney stones    Hypercholesteremia    Hyperglycemia    Hypothyroidism    Leg edema    Osteoporosis    PE (pulmonary thromboembolism) ([place]) 05/2020   after back surgery   PMR (polymyalgia rheumatica) (HCC)    PONV (postoperative nausea and vomiting)    Renal stone    Vitamin D deficiency    Wears glasses    Wears hearing aid in both ears     Past Surgical History:  Procedure Laterality Date   [place]  2014   BLEPHAROPLASTY  2016   both eyes   CATARACT EXTRACTION W/PHACO Right 06/21/2021   Procedure: CATARACT EXTRACTION PHACO AND INTRAOCULAR LENS PLACEMENT ([place]) RIGHT TORIC LENS 6.50 01:00.6;  Surgeon: Leandrew Koyanagi, MD;  Location: [place];  Service: Ophthalmology;  Laterality: Right;   COLONOSCOPY     ESOPHAGOGASTRODUODENOSCOPY     IR ANGIOGRAM PULMONARY BILATERAL SELECTIVE  05/21/2020   IR ANGIOGRAM SELECTIVE EACH ADDITIONAL VESSEL  05/21/2020   IR ANGIOGRAM SELECTIVE EACH ADDITIONAL VESSEL  05/21/2020   IR THROMBECT PRIM MECH INIT ([place]) MOD SED  05/21/2020   IR THROMBECT PRIM MECH INIT (INCLU) MOD SED  05/21/2020   IR US GUIDE VASC ACCESS RIGHT  05/21/2020   IR VENOCAVAGRAM SVC  05/21/2020   LUMBAR LAMINECTOMY  1989   LUMBAR LAMINECTOMY WITH COFLEX 2  LEVEL N/A 04/12/2015   Procedure: Lumbar two- three,  Lumbar three- four Laminectomy with Coflex;  Surgeon: Kristeen Miss, MD;  Location: [place] [facility];  Service: Neurosurgery;  Laterality: N/A;  L2-3 L3-4 Laminectomy with Coflex   POSTERIOR LUMBAR FUSION 4 LEVEL N/A 05/14/2020   Procedure: Revision of fixation from Lumbar Two to lium with decompression of Lumbar Four vertebral fracture;  Surgeon: Kristeen Miss, MD;  Location: [place];  Service: Neurosurgery;  Laterality: N/A;   RADIOLOGY WITH ANESTHESIA N/A 05/21/2020   Procedure: IR WITH ANESTHESIA;  Surgeon: Criselda Peaches, MD;  Location: [place];  Service: Radiology;  Laterality: N/A;   SHOULDER ARTHROSCOPY WITH BICEPSTENOTOMY Right 05/10/2014   Procedure: SHOULDER ARTHROSCOPY WITH BICEPSTENOTOMY;  Surgeon: Tania Ade, MD;  Location: [place];  Service: Orthopedics;  Laterality: Right;   SHOULDER ARTHROSCOPY WITH DISTAL CLAVICLE RESECTION Right 05/10/2014   Procedure: SHOULDER ARTHROSCOPY WITH DISTAL CLAVICLE RESECTION DEBRIDEMENT OF ROTATOR CUFF TEAR;  Surgeon: Tania Ade, MD;  Location: [place];  Service: Orthopedics;  Laterality: Right;   SHOULDER ARTHROSCOPY WITH SUBACROMIAL DECOMPRESSION Right 05/10/2014   Procedure: SHOULDER ARTHROSCOPY WITH SUBACROMIAL DECOMPRESSION;  Surgeon: Tania Ade, MD;  Location: [place];  Service: Orthopedics;  Laterality: Right;   THUMB ARTHROSCOPY  3/16   left  TONSILLECTOMY     VEIN LIGATION     left leg    Prior to Admission medications   Medication Sig Start Date End Date Taking? Authorizing Provider  aspirin EC (ASPIRIN 81) 81 MG tablet Take 81 mg by mouth daily. Swallow whole.   Yes [provider]  carboxymethylcellulose (REFRESH PLUS) 0.5 % SOLN Place 1 drop into both eyes at bedtime.   Yes [provider]  Cholecalciferol (VITAMIN D3) 125 MCG (5000 UT) TABS Take 5,000 Units by mouth every other day.   Yes [provider]  famotidine (PEPCID) 20 MG tablet Take 20 mg by mouth 2 (two) times daily before a meal.   Yes [provider]  folic acid (FOLVITE) 1 MG tablet Take 2 mg by mouth daily. 03/24/20  Yes [provider]  hydrochlorothiazide (HYDRODIURIL) 12.5 MG tablet Take 12.5 mg by mouth daily. 01/12/20  Yes [provider]  levothyroxine (SYNTHROID, LEVOTHROID) 75 MCG tablet Take 75 mcg by mouth daily before breakfast.   Yes [provider]  methocarbamol (ROBAXIN) 500 MG tablet Take 1 tablet (500 mg total) by mouth every 6 (six) hours as needed for muscle spasms. 05/19/20  Yes Kristeen Miss, MD  mometasone (ELOCON) 0.1 % cream Apply to affected areas rash once to twice daily until improved. Avoid face, groin, axilla. 10/25/20  Yes Brendolyn Patty, MD  pregabalin (LYRICA) 75 MG capsule Take 1 capsule (75 mg total) by mouth 3 (three) times daily. 05/19/20  Yes Kristeen Miss, MD  simvastatin (ZOCOR) 20 MG tablet Take 20 mg by mouth at bedtime.    Yes [provider]  triamcinolone (NASACORT) 55 MCG/ACT AERO nasal inhaler Place 1 spray into the nose at bedtime.   Yes [provider]    Allergies as of 04/17/2021 - Review Complete 12/07/2020  Allergen Reaction Noted   Azithromycin Diarrhea 08/02/2017   Hydrocortisone Itching 11/09/2020   Levaquin [levofloxacin] Nausea And Vomiting 05/05/2014   Tixocortol Itching 11/09/2020   Ultram [tramadol] Nausea And Vomiting 05/19/2014    Family History  Problem Relation Age of Onset   Breast cancer Maternal Aunt 70   Breast cancer Maternal Aunt 70    Social History   Socioeconomic History   Marital status: Married    Spouse name: Not on file   Number of children: Not on file   Years of education: Not on file   Highest education level: Not on file  Occupational History   Not on file  Tobacco Use   Smoking status: Never   Smokeless tobacco: Never  Vaping Use   Vaping Use: Never used  Substance  and Sexual Activity   Alcohol use: Yes    Comment: rare   Drug use: No   Sexual activity: Not on file  Other Topics Concern   Not on file  Social History Narrative   Not on file   Social Determinants of Health   Financial Resource Strain: Not on file  Food Insecurity: Not on file  Transportation Needs: Not on file  Physical Activity: Not on file  Stress: Not on file  Social Connections: Not on file  Intimate Partner Violence: Not on file    Review of Systems: See HPI, otherwise negative ROS  Physical Exam: BP 130/80   Pulse 65   Temp 97.9 F (36.6 C) (Temporal)   Resp 18   Ht 5' 2.5" (1.588 m)   Wt 77.1 kg   SpO2 95%   BMI 30.60 kg/m  General:  Alert,  pleasant and cooperative in NAD Head:  Normocephalic and atraumatic. Lungs:  Clear to auscultation.    Heart:  Regular rate and rhythm.   Impression/Plan: Margrett Rud is here for ophthalmic surgery.  Risks, benefits, limitations, and alternatives regarding ophthalmic surgery have been reviewed with the patient.  Questions have been answered.  All parties agreeable.   Leandrew Koyanagi, MD  07/12/2021, 12:41 PM

## 2021-07-12 NOTE — Anesthesia Postprocedure Evaluation (Signed)
Anesthesia Post Note  Patient: Karen Ramirez  Procedure(s) Performed: CATARACT EXTRACTION PHACO AND INTRAOCULAR LENS PLACEMENT (IOC) LEFT TORIC LENS 5.27 01:03.2 (Left: Eye)     Patient location during evaluation: PACU Anesthesia Type: MAC Level of consciousness: awake and alert Pain management: pain level controlled Vital Signs Assessment: post-procedure vital signs reviewed and stable Respiratory status: spontaneous breathing, nonlabored ventilation, respiratory function stable and patient connected to nasal cannula oxygen Cardiovascular status: stable and blood pressure returned to baseline Postop Assessment: no apparent nausea or vomiting Anesthetic complications: no   No notable events documented.  April Manson

## 2021-07-12 NOTE — Anesthesia Preprocedure Evaluation (Signed)
Anesthesia Evaluation  Patient identified by MRN, date of birth, ID band Patient awake    Reviewed: Allergy & Precautions, NPO status   History of Anesthesia Complications (+) PONV and history of anesthetic complications  Airway Mallampati: II  TM Distance: >3 FB Neck ROM: Full    Dental   Pulmonary PE   breath sounds clear to auscultation + decreased breath sounds      Cardiovascular hypertension,  Rhythm:Regular Rate:Normal     Neuro/Psych    GI/Hepatic GERD  ,diverticulosis   Endo/Other  Hypothyroidism BMI > 30   Renal/GU      Musculoskeletal  (+) Arthritis , Osteoarthritis,    Abdominal   Peds  Hematology   Anesthesia Other Findings   Reproductive/Obstetrics                            Anesthesia Physical  Anesthesia Plan  ASA: 3  Anesthesia Plan: MAC   Post-op Pain Management:    Induction: Intravenous  PONV Risk Score and Plan: 3 and Treatment may vary due to age or medical condition, Midazolam and TIVA  Airway Management Planned: Nasal Cannula  Additional Equipment: Arterial line  Intra-op Plan:   Post-operative Plan:   Informed Consent: I have reviewed the patients History and Physical, chart, labs and discussed the procedure including the risks, benefits and alternatives for the proposed anesthesia with the patient or authorized representative who has indicated his/her understanding and acceptance.       Plan Discussed with: CRNA  Anesthesia Plan Comments:         Anesthesia Quick Evaluation  

## 2021-07-13 ENCOUNTER — Encounter: Payer: Self-pay | Admitting: Ophthalmology

## 2021-07-31 ENCOUNTER — Other Ambulatory Visit: Payer: Self-pay | Admitting: Internal Medicine

## 2021-07-31 DIAGNOSIS — R928 Other abnormal and inconclusive findings on diagnostic imaging of breast: Secondary | ICD-10-CM

## 2021-07-31 DIAGNOSIS — N63 Unspecified lump in unspecified breast: Secondary | ICD-10-CM

## 2021-09-13 ENCOUNTER — Ambulatory Visit
Admission: RE | Admit: 2021-09-13 | Discharge: 2021-09-13 | Disposition: A | Discharge status: Home / Self Care | Payer: Medicare Other | Source: Ambulatory Visit | Attending: Internal Medicine | Admitting: Internal Medicine

## 2021-09-13 DIAGNOSIS — R928 Other abnormal and inconclusive findings on diagnostic imaging of breast: Secondary | ICD-10-CM | POA: Insufficient documentation

## 2021-09-13 DIAGNOSIS — N63 Unspecified lump in unspecified breast: Secondary | ICD-10-CM | POA: Insufficient documentation

## 2021-10-03 ENCOUNTER — Ambulatory Visit (INDEPENDENT_AMBULATORY_CARE_PROVIDER_SITE_OTHER): Payer: Medicare Other | Admitting: Dermatology

## 2021-10-03 DIAGNOSIS — L309 Dermatitis, unspecified: Secondary | ICD-10-CM

## 2021-10-03 DIAGNOSIS — R21 Rash and other nonspecific skin eruption: Secondary | ICD-10-CM

## 2021-10-03 DIAGNOSIS — L820 Inflamed seborrheic keratosis: Secondary | ICD-10-CM

## 2021-10-03 DIAGNOSIS — Q825 Congenital non-neoplastic nevus: Secondary | ICD-10-CM

## 2021-10-03 MED ORDER — PIMECROLIMUS 1 % EX CREA: TOPICAL_CREAM | CUTANEOUS | 2 refills | Status: DC

## 2021-10-03 NOTE — Patient Instructions (Addendum)
Cryotherapy Aftercare  Wash gently with soap and water everyday.   Apply Vaseline and Band-Aid daily until healed.     Due to recent changes in healthcare laws, you may see results of your pathology and/or laboratory studies on MyChart before the doctors have had a chance to review them. We understand that in some cases there may be results that are confusing or concerning to you. Please understand that not all results are received at the same time and often the doctors may need to interpret multiple results in order to provide you with the best plan of care or course of treatment. Therefore, we ask that you please give us 2 business days to thoroughly review all your results before contacting the office for clarification. Should we see a critical lab result, you will be contacted sooner.   If You Need Anything After Your Visit  If you have any questions or concerns for your doctor, please call our main line at [phone] and press option 4 to reach your doctor's medical assistant. If no one answers, please leave a voicemail as directed and we will return your call as soon as possible. Messages left after 4 pm will be answered the following business day.   You may also send us a message via MyChart. We typically respond to MyChart messages within 1-2 business days.  For prescription refills, please ask your pharmacy to contact our office. Our fax number is [phone].  If you have an urgent issue when the clinic is closed that cannot wait until the next business day, you can page your doctor at the number below.    Please note that while we do our best to be available for urgent issues outside of office hours, we are not available 24/7.   If you have an urgent issue and are unable to reach us, you may choose to seek medical care at your doctor's office, retail clinic, urgent care center, or emergency room.  If you have a medical emergency, please immediately call 911 or go to the  emergency department.  Pager Numbers  - Dr. Kowalski: [phone]  - Dr. Moye: [phone]  - Dr. Stewart: [phone]  In the event of inclement weather, please call our main line at [phone] for an update on the status of any delays or closures.  Dermatology Medication Tips: Please keep the boxes that topical medications come in in order to help keep track of the instructions about where and how to use these. Pharmacies typically print the medication instructions only on the boxes and not directly on the medication tubes.   If your medication is too expensive, please contact our office at [phone] option 4 or send us a message through MyChart.   We are unable to tell what your co-pay for medications will be in advance as this is different depending on your insurance coverage. However, we may be able to find a substitute medication at lower cost or fill out paperwork to get insurance to cover a needed medication.   If a prior authorization is required to get your medication covered by your insurance company, please allow us 1-2 business days to complete this process.  Drug prices often vary depending on where the prescription is filled and some pharmacies may offer cheaper prices.  The website www.goodrx.com contains coupons for medications through different pharmacies. The prices here do not account for what the cost may be with help from insurance (it may be cheaper with your insurance), but the website can   give you the price if you did not use any insurance.  - You can print the associated coupon and take it with your prescription to the pharmacy.  - You may also stop by our office during regular business hours and pick up a GoodRx coupon card.  - If you need your prescription sent electronically to a different pharmacy, notify our office through [facility] MyChart or by phone at [phone] option 4.     Si Usted Necesita Algo Despus de Su Visita  Tambin puede  enviarnos un mensaje a travs de MyChart. Por lo general respondemos a los mensajes de MyChart en el transcurso de 1 a 2 das hbiles.  Para renovar recetas, por favor pida a su farmacia que se ponga en contacto con nuestra oficina. Nuestro nmero de fax es el [phone].  Si tiene un asunto urgente cuando la clnica est cerrada y que no puede esperar hasta el siguiente da hbil, puede llamar/localizar a su doctor(a) al nmero que aparece a continuacin.   Por favor, tenga en cuenta que aunque hacemos todo lo posible para estar disponibles para asuntos urgentes fuera del horario de oficina, no estamos disponibles las 24 horas del da, los 7 das de la semana.   Si tiene un problema urgente y no puede comunicarse con nosotros, puede optar por buscar atencin mdica  en el consultorio de su doctor(a), en una clnica privada, en un centro de atencin urgente o en una sala de emergencias.  Si tiene una emergencia mdica, por favor llame inmediatamente al 911 o vaya a la sala de emergencias.  Nmeros de bper  - Dr. Kowalski: [phone]  - Dra. Moye: [phone]  - Dra. Stewart: [phone]  En caso de inclemencias del tiempo, por favor llame a nuestra lnea principal al [phone] para una actualizacin sobre el estado de cualquier retraso o cierre.  Consejos para la medicacin en dermatologa: Por favor, guarde las cajas en las que vienen los medicamentos de uso tpico para ayudarle a seguir las instrucciones sobre dnde y cmo usarlos. Las farmacias generalmente imprimen las instrucciones del medicamento slo en las cajas y no directamente en los tubos del medicamento.   Si su medicamento es muy caro, por favor, pngase en contacto con nuestra oficina llamando al [phone] y presione la opcin 4 o envenos un mensaje a travs de MyChart.   No podemos decirle cul ser su copago por los medicamentos por adelantado ya que esto es diferente dependiendo de la cobertura de su seguro.  Sin embargo, es posible que podamos encontrar un medicamento sustituto a menor costo o llenar un formulario para que el seguro cubra el medicamento que se considera necesario.   Si se requiere una autorizacin previa para que su compaa de seguros cubra su medicamento, por favor permtanos de 1 a 2 das hbiles para completar este proceso.  Los precios de los medicamentos varan con frecuencia dependiendo del lugar de dnde se surte la receta y alguna farmacias pueden ofrecer precios ms baratos.  El sitio web www.goodrx.com tiene cupones para medicamentos de diferentes farmacias. Los precios aqu no tienen en cuenta lo que podra costar con la ayuda del seguro (puede ser ms barato con su seguro), pero el sitio web puede darle el precio si no utiliz ningn seguro.  - Puede imprimir el cupn correspondiente y llevarlo con su receta a la farmacia.  - Tambin puede pasar por nuestra oficina durante el horario de atencin regular y recoger una tarjeta de cupones de GoodRx.  -   Si necesita que su receta se enve electrnicamente a una farmacia diferente, informe a nuestra oficina a travs de MyChart de [facility] o por telfono llamando al [phone] y presione la opcin 4.  

## 2021-10-03 NOTE — Progress Notes (Signed)
   Follow-Up Visit   Subjective  Karen Ramirez is a 71 y.o. female who presents for the following: Follow-up (Patient here today to treat ISK's.).  Patient did not treat at last visit because she wanted to wait until after her cataract surgery.   The following portions of the chart were reviewed this encounter and updated as appropriate:       Review of Systems:  No other skin or systemic complaints except as noted in HPI or Assessment and Plan.  Objective  Well appearing patient in no apparent distress; mood and affect are within normal limits.  A focused examination was performed including lower extremities, including the legs, feet, toes, and toenails and face, neck, chest and back. Relevant physical exam findings are noted in the Assessment and Plan.  Right upper back x 3, right upper sternum (4) Erythematous stuck-on, waxy papule  neck, upper chest Few light pink papules at upper chest and neck  occipital scalp Pink patch    Assessment & Plan  Inflamed seborrheic keratosis (4) Right upper back x 3, right upper sternum  Symptomatic, irritating, patient would like treated.   Destruction of lesion - Right upper back x 3, right upper sternum  Destruction method: cryotherapy   Informed consent: discussed and consent obtained   Lesion destroyed using liquid nitrogen: Yes   Region frozen until ice ball extended beyond lesion: Yes   Outcome: patient tolerated procedure well with no complications   Post-procedure details: wound care instructions given   Additional details:  Prior to procedure, discussed risks of blister formation, small wound, skin dyspigmentation, or rare scar following cryotherapy. Recommend Vaseline ointment to treated areas while healing.   Dermatitis neck, upper chest  Start pimecrolimus cream twice daily to aas chest/neck as needed for rash.  Pt has tixocortol allergy class A, safest topical steroid would be in Class C (cloderm, topicort).   Mometasone (class D1) could cross react, so will d/c  Recommend mild soap and moisturizing cream 1-2 times daily.  pimecrolimus (ELIDEL) 1 % cream - neck, upper chest Apply twice daily as needed for rash.  Nevus flammeus occipital scalp  Benign, observe.    Rash  Related Medications mometasone (ELOCON) 0.1 % cream Apply to affected areas rash once to twice daily until improved. Avoid face, groin, axilla.   Return in about 8 months (around 06/04/2022) for TBSE.  Graciella Belton, RMA, am acting as scribe for Brendolyn Patty, MD .  Documentation: I have reviewed the above documentation for accuracy and completeness, and I agree with the above.  Brendolyn Patty MD

## 2021-10-09 ENCOUNTER — Telehealth: Payer: Self-pay

## 2021-10-09 MED ORDER — CLOCORTOLONE PIVALATE 0.1 % EX CREA: 1.0000 | TOPICAL_CREAM | CUTANEOUS | 0 refills | Status: DC

## 2021-10-09 MED ORDER — DESOXIMETASONE 0.05 % EX CREA: TOPICAL_CREAM | CUTANEOUS | 0 refills | Status: DC

## 2021-10-09 NOTE — Telephone Encounter (Signed)
Patient advised of information per Dr. Nicole Kindred. Both prescriptions sent in, patient advised do not pick up both medications, pick up cheaper RX. aw

## 2021-10-09 NOTE — Telephone Encounter (Signed)
Patient left VM that Elidel was too expensive for her through insurance ($175). [facility] does have this right now from about $70-$80 depending on the pharmacy.  Due to her allergies with steroids I wanted to confirm with you if you wanted to change to different medication or give her the [facility] option.

## 2022-01-09 DIAGNOSIS — E034 Atrophy of thyroid (acquired): Secondary | ICD-10-CM | POA: Diagnosis not present

## 2022-01-09 DIAGNOSIS — E7849 Other hyperlipidemia: Secondary | ICD-10-CM | POA: Diagnosis not present

## 2022-01-09 DIAGNOSIS — M353 Polymyalgia rheumatica: Secondary | ICD-10-CM | POA: Diagnosis not present

## 2022-01-09 DIAGNOSIS — M5136 Other intervertebral disc degeneration, lumbar region: Secondary | ICD-10-CM | POA: Diagnosis not present

## 2022-01-09 DIAGNOSIS — M8588 Other specified disorders of bone density and structure, other site: Secondary | ICD-10-CM | POA: Diagnosis not present

## 2022-01-11 DIAGNOSIS — M65332 Trigger finger, left middle finger: Secondary | ICD-10-CM | POA: Diagnosis not present

## 2022-01-16 DIAGNOSIS — Z86711 Personal history of pulmonary embolism: Secondary | ICD-10-CM | POA: Diagnosis not present

## 2022-01-16 DIAGNOSIS — I82409 Acute embolism and thrombosis of unspecified deep veins of unspecified lower extremity: Secondary | ICD-10-CM | POA: Diagnosis not present

## 2022-01-16 DIAGNOSIS — I2510 Atherosclerotic heart disease of native coronary artery without angina pectoris: Secondary | ICD-10-CM | POA: Diagnosis not present

## 2022-01-16 DIAGNOSIS — R7303 Prediabetes: Secondary | ICD-10-CM | POA: Diagnosis not present

## 2022-01-16 DIAGNOSIS — Z79899 Other long term (current) drug therapy: Secondary | ICD-10-CM | POA: Diagnosis not present

## 2022-01-16 DIAGNOSIS — Z0000 Encounter for general adult medical examination without abnormal findings: Secondary | ICD-10-CM | POA: Diagnosis not present

## 2022-01-16 DIAGNOSIS — Z1159 Encounter for screening for other viral diseases: Secondary | ICD-10-CM | POA: Diagnosis not present

## 2022-01-16 DIAGNOSIS — Z1331 Encounter for screening for depression: Secondary | ICD-10-CM | POA: Diagnosis not present

## 2022-01-16 DIAGNOSIS — E559 Vitamin D deficiency, unspecified: Secondary | ICD-10-CM | POA: Diagnosis not present

## 2022-01-16 DIAGNOSIS — E785 Hyperlipidemia, unspecified: Secondary | ICD-10-CM | POA: Diagnosis not present

## 2022-01-16 DIAGNOSIS — M0609 Rheumatoid arthritis without rheumatoid factor, multiple sites: Secondary | ICD-10-CM | POA: Diagnosis not present

## 2022-01-16 DIAGNOSIS — E039 Hypothyroidism, unspecified: Secondary | ICD-10-CM | POA: Diagnosis not present

## 2022-01-22 ENCOUNTER — Other Ambulatory Visit: Payer: Self-pay | Admitting: Internal Medicine

## 2022-01-22 DIAGNOSIS — Z1231 Encounter for screening mammogram for malignant neoplasm of breast: Secondary | ICD-10-CM

## 2022-01-29 DIAGNOSIS — M79642 Pain in left hand: Secondary | ICD-10-CM | POA: Diagnosis not present

## 2022-02-08 DIAGNOSIS — H04123 Dry eye syndrome of bilateral lacrimal glands: Secondary | ICD-10-CM | POA: Diagnosis not present

## 2022-02-08 DIAGNOSIS — Z961 Presence of intraocular lens: Secondary | ICD-10-CM | POA: Diagnosis not present

## 2022-02-08 DIAGNOSIS — H43813 Vitreous degeneration, bilateral: Secondary | ICD-10-CM | POA: Diagnosis not present

## 2022-02-28 DIAGNOSIS — M8589 Other specified disorders of bone density and structure, multiple sites: Secondary | ICD-10-CM | POA: Diagnosis not present

## 2022-03-08 ENCOUNTER — Ambulatory Visit
Admission: RE | Admit: 2022-03-08 | Discharge: 2022-03-08 | Disposition: A | Discharge status: Home / Self Care | Payer: Medicare HMO | Source: Ambulatory Visit | Attending: Internal Medicine | Admitting: Internal Medicine

## 2022-03-08 DIAGNOSIS — Z1231 Encounter for screening mammogram for malignant neoplasm of breast: Secondary | ICD-10-CM | POA: Insufficient documentation

## 2022-04-17 ENCOUNTER — Telehealth: Payer: Self-pay

## 2022-04-17 NOTE — Telephone Encounter (Signed)
Pt called she has a new spot on her leg that need to be check, ok scheduled pt for 05/02/2022

## 2022-04-19 DIAGNOSIS — M0609 Rheumatoid arthritis without rheumatoid factor, multiple sites: Secondary | ICD-10-CM | POA: Diagnosis not present

## 2022-04-19 DIAGNOSIS — Z79899 Other long term (current) drug therapy: Secondary | ICD-10-CM | POA: Diagnosis not present

## 2022-05-02 ENCOUNTER — Encounter: Payer: Self-pay | Admitting: Dermatology

## 2022-05-02 ENCOUNTER — Ambulatory Visit: Payer: Medicare HMO | Admitting: Dermatology

## 2022-05-02 VITALS — BP 118/75 | HR 68

## 2022-05-02 DIAGNOSIS — L578 Other skin changes due to chronic exposure to nonionizing radiation: Secondary | ICD-10-CM | POA: Diagnosis not present

## 2022-05-02 DIAGNOSIS — L820 Inflamed seborrheic keratosis: Secondary | ICD-10-CM | POA: Diagnosis not present

## 2022-05-02 DIAGNOSIS — Q828 Other specified congenital malformations of skin: Secondary | ICD-10-CM

## 2022-05-02 NOTE — Patient Instructions (Addendum)
Cryotherapy Aftercare  Wash gently with soap and water everyday.   Apply Vaseline and Band-Aid daily until healed.    Recommend daily broad spectrum sunscreen SPF 30+ to sun-exposed areas, reapply every 2 hours as needed. Call for new or changing lesions.  Staying in the shade or wearing long sleeves, sun glasses (UVA+UVB protection) and wide brim hats (4-inch brim around the entire circumference of the hat) are also recommended for sun protection.     Due to recent changes in healthcare laws, you may see results of your pathology and/or laboratory studies on MyChart before the doctors have had a chance to review them. We understand that in some cases there may be results that are confusing or concerning to you. Please understand that not all results are received at the same time and often the doctors may need to interpret multiple results in order to provide you with the best plan of care or course of treatment. Therefore, we ask that you please give us 2 business days to thoroughly review all your results before contacting the office for clarification. Should we see a critical lab result, you will be contacted sooner.   If You Need Anything After Your Visit  If you have any questions or concerns for your doctor, please call our main line at [phone] and press option 4 to reach your doctor's medical assistant. If no one answers, please leave a voicemail as directed and we will return your call as soon as possible. Messages left after 4 pm will be answered the following business day.   You may also send us a message via MyChart. We typically respond to MyChart messages within 1-2 business days.  For prescription refills, please ask your pharmacy to contact our office. Our fax number is [phone].  If you have an urgent issue when the clinic is closed that cannot wait until the next business day, you can page your doctor at the number below.    Please note that while we do our best to  be available for urgent issues outside of office hours, we are not available 24/7.   If you have an urgent issue and are unable to reach us, you may choose to seek medical care at your doctor's office, retail clinic, urgent care center, or emergency room.  If you have a medical emergency, please immediately call 911 or go to the emergency department.  Pager Numbers  - Dr. Kowalski: [phone]  - Dr. Moye: [phone]  - Dr. Stewart: [phone]  In the event of inclement weather, please call our main line at [phone] for an update on the status of any delays or closures.  Dermatology Medication Tips: Please keep the boxes that topical medications come in in order to help keep track of the instructions about where and how to use these. Pharmacies typically print the medication instructions only on the boxes and not directly on the medication tubes.   If your medication is too expensive, please contact our office at [phone] option 4 or send us a message through MyChart.   We are unable to tell what your co-pay for medications will be in advance as this is different depending on your insurance coverage. However, we may be able to find a substitute medication at lower cost or fill out paperwork to get insurance to cover a needed medication.   If a prior authorization is required to get your medication covered by your insurance company, please allow us 1-2 business days to complete this process.    Drug prices often vary depending on where the prescription is filled and some pharmacies may offer cheaper prices.  The website www.goodrx.com contains coupons for medications through different pharmacies. The prices here do not account for what the cost may be with help from insurance (it may be cheaper with your insurance), but the website can give you the price if you did not use any insurance.  - You can print the associated coupon and take it with your prescription to the pharmacy.   - You may also stop by our office during regular business hours and pick up a GoodRx coupon card.  - If you need your prescription sent electronically to a different pharmacy, notify our office through [facility] MyChart or by phone at [phone] option 4.     Si Usted Necesita Algo Despus de Su Visita  Tambin puede enviarnos un mensaje a travs de MyChart. Por lo general respondemos a los mensajes de MyChart en el transcurso de 1 a 2 das hbiles.  Para renovar recetas, por favor pida a su farmacia que se ponga en contacto con nuestra oficina. Nuestro nmero de fax es el [phone].  Si tiene un asunto urgente cuando la clnica est cerrada y que no puede esperar hasta el siguiente da hbil, puede llamar/localizar a su doctor(a) al nmero que aparece a continuacin.   Por favor, tenga en cuenta que aunque hacemos todo lo posible para estar disponibles para asuntos urgentes fuera del horario de oficina, no estamos disponibles las 24 horas del da, los 7 das de la semana.   Si tiene un problema urgente y no puede comunicarse con nosotros, puede optar por buscar atencin mdica  en el consultorio de su doctor(a), en una clnica privada, en un centro de atencin urgente o en una sala de emergencias.  Si tiene una emergencia mdica, por favor llame inmediatamente al 911 o vaya a la sala de emergencias.  Nmeros de bper  - Dr. Kowalski: [phone]  - Dra. Moye: [phone]  - Dra. Stewart: [phone]  En caso de inclemencias del tiempo, por favor llame a nuestra lnea principal al [phone] para una actualizacin sobre el estado de cualquier retraso o cierre.  Consejos para la medicacin en dermatologa: Por favor, guarde las cajas en las que vienen los medicamentos de uso tpico para ayudarle a seguir las instrucciones sobre dnde y cmo usarlos. Las farmacias generalmente imprimen las instrucciones del medicamento slo en las cajas y no directamente en los tubos del  medicamento.   Si su medicamento es muy caro, por favor, pngase en contacto con nuestra oficina llamando al [phone] y presione la opcin 4 o envenos un mensaje a travs de MyChart.   No podemos decirle cul ser su copago por los medicamentos por adelantado ya que esto es diferente dependiendo de la cobertura de su seguro. Sin embargo, es posible que podamos encontrar un medicamento sustituto a menor costo o llenar un formulario para que el seguro cubra el medicamento que se considera necesario.   Si se requiere una autorizacin previa para que su compaa de seguros cubra su medicamento, por favor permtanos de 1 a 2 das hbiles para completar este proceso.  Los precios de los medicamentos varan con frecuencia dependiendo del lugar de dnde se surte la receta y alguna farmacias pueden ofrecer precios ms baratos.  El sitio web www.goodrx.com tiene cupones para medicamentos de diferentes farmacias. Los precios aqu no tienen en cuenta lo que podra costar con la ayuda del seguro (puede ser ms   barato con su seguro), pero el sitio web puede darle el precio si no utiliz ningn seguro.  - Puede imprimir el cupn correspondiente y llevarlo con su receta a la farmacia.  - Tambin puede pasar por nuestra oficina durante el horario de atencin regular y recoger una tarjeta de cupones de GoodRx.  - Si necesita que su receta se enve electrnicamente a una farmacia diferente, informe a nuestra oficina a travs de MyChart de [facility] o por telfono llamando al [phone] y presione la opcin 4.  

## 2022-05-02 NOTE — Progress Notes (Signed)
   Follow-Up Visit   Subjective  Karen Ramirez is a 72 y.o. female who presents for the following: Spot at right calf. Dur: 1 month. Rough texture. Had different colors and is scaly.  Also new spot on chest to check.  The patient has spots, moles and lesions to be evaluated, some may be new or changing and the patient has concerns that these could be cancer.    The following portions of the chart were reviewed this encounter and updated as appropriate: medications, allergies, medical history  Review of Systems:  No other skin or systemic complaints except as noted in HPI or Assessment and Plan.  Objective  Well appearing patient in no apparent distress; mood and affect are within normal limits.  A focused examination was performed of the following areas: Right leg  Relevant physical exam findings are noted in the Assessment and Plan.  Left chest, left medial lower leg Pink flat waxy patches with keratotic rim    Assessment & Plan   Porokeratosis Left chest, left medial lower leg  Benign-appearing.  Observation.  Call clinic for new or changing lesions.  Recommend daily use of broad spectrum spf 30+ sunscreen to sun-exposed areas.    ACTINIC DAMAGE - chronic, secondary to cumulative UV radiation exposure/sun exposure over time - diffuse scaly erythematous macules with underlying dyspigmentation - Recommend daily broad spectrum sunscreen SPF 30+ to sun-exposed areas, reapply every 2 hours as needed.  - Recommend staying in the shade or wearing long sleeves, sun glasses (UVA+UVB protection) and wide brim hats (4-inch brim around the entire circumference of the hat). - Call for new or changing lesions.  INFLAMED SEBORRHEIC KERATOSIS Exam: Erythematous keratotic or waxy stuck-on papule R lateral calf  Symptomatic, irritating, patient would like treated.  Benign-appearing.  Call clinic for new or changing lesions.   Prior to procedure, discussed risks of blister  formation, small wound, skin dyspigmentation, or rare scar following treatment. Recommend Vaseline ointment to treated areas while healing.  Destruction Procedure Note Destruction method: cryotherapy   Informed consent: discussed and consent obtained   Lesion destroyed using liquid nitrogen: Yes   Outcome: patient tolerated procedure well with no complications   Post-procedure details: wound care instructions given   Locations: right lateral calf x1 # of Lesions Treated: 1    Return for TBSE As Scheduled.  I, Lawson Radar, CMA, am acting as scribe for Willeen Niece, MD.   Documentation: I have reviewed the above documentation for accuracy and completeness, and I agree with the above.  Willeen Niece, MD

## 2022-06-05 ENCOUNTER — Ambulatory Visit: Payer: Medicare HMO | Admitting: Dermatology

## 2022-06-05 ENCOUNTER — Encounter: Payer: Self-pay | Admitting: Dermatology

## 2022-06-05 VITALS — BP 118/77 | HR 74

## 2022-06-05 DIAGNOSIS — L814 Other melanin hyperpigmentation: Secondary | ICD-10-CM | POA: Diagnosis not present

## 2022-06-05 DIAGNOSIS — D229 Melanocytic nevi, unspecified: Secondary | ICD-10-CM

## 2022-06-05 DIAGNOSIS — Z85828 Personal history of other malignant neoplasm of skin: Secondary | ICD-10-CM

## 2022-06-05 DIAGNOSIS — L821 Other seborrheic keratosis: Secondary | ICD-10-CM

## 2022-06-05 DIAGNOSIS — D225 Melanocytic nevi of trunk: Secondary | ICD-10-CM

## 2022-06-05 DIAGNOSIS — Z1283 Encounter for screening for malignant neoplasm of skin: Secondary | ICD-10-CM

## 2022-06-05 DIAGNOSIS — L570 Actinic keratosis: Secondary | ICD-10-CM | POA: Diagnosis not present

## 2022-06-05 DIAGNOSIS — R238 Other skin changes: Secondary | ICD-10-CM

## 2022-06-05 DIAGNOSIS — L578 Other skin changes due to chronic exposure to nonionizing radiation: Secondary | ICD-10-CM | POA: Diagnosis not present

## 2022-06-05 DIAGNOSIS — Z872 Personal history of diseases of the skin and subcutaneous tissue: Secondary | ICD-10-CM

## 2022-06-05 DIAGNOSIS — Q828 Other specified congenital malformations of skin: Secondary | ICD-10-CM

## 2022-06-05 DIAGNOSIS — X32XXXA Exposure to sunlight, initial encounter: Secondary | ICD-10-CM | POA: Diagnosis not present

## 2022-06-05 DIAGNOSIS — W908XXA Exposure to other nonionizing radiation, initial encounter: Secondary | ICD-10-CM | POA: Diagnosis not present

## 2022-06-05 DIAGNOSIS — L309 Dermatitis, unspecified: Secondary | ICD-10-CM | POA: Diagnosis not present

## 2022-06-05 MED ORDER — PIMECROLIMUS 1 % EX CREA
TOPICAL_CREAM | CUTANEOUS | 2 refills | Status: DC
Start: 2022-06-05 — End: 2023-07-29

## 2022-06-05 NOTE — Progress Notes (Signed)
Follow-Up Visit   Subjective  Karen Ramirez is a 72 y.o. female who presents for the following: Skin Cancer Screening and Full Body Skin Exam Hx of isks, hx of bcc   Recently started humira for rheumatoid arthritis   The patient presents for Total-Body Skin Exam (TBSE) for skin cancer screening and mole check. The patient has spots, moles and lesions to be evaluated, some may be new or changing and the patient has concerns that these could be cancer.    The following portions of the chart were reviewed this encounter and updated as appropriate: medications, allergies, medical history  Review of Systems:  No other skin or systemic complaints except as noted in HPI or Assessment and Plan.  Objective  Well appearing patient in no apparent distress; mood and affect are within normal limits.  A full examination was performed including scalp, head, eyes, ears, nose, lips, neck, chest, axillae, abdomen, back, buttocks, bilateral upper extremities, bilateral lower extremities, hands, feet, fingers, toes, fingernails, and toenails. All findings within normal limits unless otherwise noted below.   Relevant physical exam findings are noted in the Assessment and Plan.  left temple x 1, right eyebrow x 1 (2) Erythematous thin papules/macules with gritty scale.        Assessment & Plan   LENTIGINES, SEBORRHEIC KERATOSES, HEMANGIOMAS - Benign normal skin lesions - Benign-appearing - Call for any changes Sk vs purpura  3 x 2 cm waxy pink patch left calf  Benign-appearing.  Observation.  Call clinic for new or changing lesions.  Recommend daily use of broad spectrum spf 30+ sunscreen to sun-exposed areas.    Porokeratosis left medial lower pretibia    Pink waxy patch with keratotic rim 1.5 cm    Benign-appearing.  Stable. Observation.  Call clinic for new or changing lesions.  Recommend daily use of broad spectrum spf 30+ sunscreen to sun-exposed areas.   Nevus  flammeus occipital scalp Pink patch    Benign, observe.    Dermatitis vs folliculitis  neck, upper chest  Exam : Scattered pink papules  See above photos  Treatment Plan:  restart pimecrolimus cream twice daily to aas chest/neck as needed for rash.    Pt has tixocortol allergy class A, safest topical steroid would be in Class C (cloderm, topicort).  Mometasone (class D1) could cross react   Recommend mild soap and moisturizing cream 1-2 times daily.   MELANOCYTIC NEVI - Tan-brown and/or pink-flesh-colored symmetric macules and papules - Benign appearing on exam today - Observation - Call clinic for new or changing moles - Recommend daily use of broad spectrum spf 30+ sunscreen to sun-exposed areas.  Nevus right lower back medial, right lower back lateral   0.6 x 0.2 cm brown macule, darker edge at right lower back medial  0.7 x 0.3 cm medium brown macule at right lower back lateral  Benign-appearing.  Observation.  Call clinic for new or changing moles.  Recommend daily use of broad spectrum spf 30+ sunscreen to sun-exposed areas.     ACTINIC DAMAGE - Chronic condition, secondary to cumulative UV/sun exposure - diffuse scaly erythematous macules with underlying dyspigmentation - Recommend daily broad spectrum sunscreen SPF 30+ to sun-exposed areas, reapply every 2 hours as needed.  - Staying in the shade or wearing long sleeves, sun glasses (UVA+UVB protection) and wide brim hats (4-inch brim around the entire circumference of the hat) are also recommended for sun protection.  - Call for new or changing lesions.  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  - Call if any new or changing lesions are noted between office visits - Recheck on follow  up  History of ISK at right lateral calf, treated with cryotherapy 4 wks ago Exam: Residual violaceous scaly macule vs PIPA from cryotherapy  Benign, observe. Recheck on f/up.  RTC if changes.  SKIN CANCER SCREENING PERFORMED TODAY.   Actinic keratosis (2) left temple x 1, right eyebrow x 1  Vs isk  Actinic keratoses are precancerous spots that appear secondary to cumulative UV radiation exposure/sun exposure over time. They are chronic with expected duration over 1 year. A portion of actinic keratoses will progress to squamous cell carcinoma of the skin. It is not possible to reliably predict which spots will progress to skin cancer and so treatment is recommended to prevent development of skin cancer.  Recommend daily broad spectrum sunscreen SPF 30+ to sun-exposed areas, reapply every 2 hours as needed.  Recommend staying in the shade or wearing long sleeves, sun glasses (UVA+UVB protection) and wide brim hats (4-inch brim around the entire circumference of the hat). Call for new or changing lesions.  Destruction of lesion - left temple x 1, right eyebrow x 1  Destruction method: cryotherapy   Informed consent: discussed and consent obtained   Lesion destroyed using liquid nitrogen: Yes   Region frozen until ice ball extended beyond lesion: Yes   Cryotherapy cycles:  2 Outcome: patient tolerated procedure well with no complications   Post-procedure details: wound care instructions given   Additional details:  Prior to procedure, discussed risks of blister formation, small wound, skin dyspigmentation, or rare scar following cryotherapy. Recommend Vaseline ointment to treated areas while healing.   Dermatitis  Related Medications pimecrolimus (ELIDEL) 1 % cream Apply twice daily as needed for rash.   Return in about 1 year (around 06/05/2023) for TBSE.  I, Asher Muir, CMA, am acting as scribe for Willeen Niece, MD.   Documentation: I have reviewed the above documentation  for accuracy and completeness, and I agree with the above.  Willeen Niece, MD

## 2022-06-05 NOTE — Patient Instructions (Addendum)
For rash at neck and chest  Can use Elidel 1 % cream to affected areas twice daily as needed until clear Safe to use on other areas of body   Actinic keratoses are precancerous spots that appear secondary to cumulative UV radiation exposure/sun exposure over time. They are chronic with expected duration over 1 year. A portion of actinic keratoses will progress to squamous cell carcinoma of the skin. It is not possible to reliably predict which spots will progress to skin cancer and so treatment is recommended to prevent development of skin cancer.  Recommend daily broad spectrum sunscreen SPF 30+ to sun-exposed areas, reapply every 2 hours as needed.  Recommend staying in the shade or wearing long sleeves, sun glasses (UVA+UVB protection) and wide brim hats (4-inch brim around the entire circumference of the hat). Call for new or changing lesions.   Cryotherapy Aftercare  Wash gently with soap and water everyday.   Apply Vaseline and Band-Aid daily until healed.    Melanoma ABCDEs  Melanoma is the most dangerous type of skin cancer, and is the leading cause of death from skin disease.  You are more likely to develop melanoma if you: Have light-colored skin, light-colored eyes, or red or blond hair Spend a lot of time in the sun Tan regularly, either outdoors or in a tanning bed Have had blistering sunburns, especially during childhood Have a close family member who has had a melanoma Have atypical moles or large birthmarks  Early detection of melanoma is key since treatment is typically straightforward and cure rates are extremely high if we catch it early.   The first sign of melanoma is often a change in a mole or a new dark spot.  The ABCDE system is a way of remembering the signs of melanoma.  A for asymmetry:  The two halves do not match. B for border:  The edges of the growth are irregular. C for color:  A mixture of colors are present instead of an even brown color. D for  diameter:  Melanomas are usually (but not always) greater than 6mm - the size of a pencil eraser. E for evolution:  The spot keeps changing in size, shape, and color.  Please check your skin once per month between visits. You can use a small mirror in front and a large mirror behind you to keep an eye on the back side or your body.   If you see any new or changing lesions before your next follow-up, please call to schedule a visit.  Please continue daily skin protection including broad spectrum sunscreen SPF 30+ to sun-exposed areas, reapplying every 2 hours as needed when you're outdoors.   Staying in the shade or wearing long sleeves, sun glasses (UVA+UVB protection) and wide brim hats (4-inch brim around the entire circumference of the hat) are also recommended for sun protection.    Due to recent changes in healthcare laws, you may see results of your pathology and/or laboratory studies on MyChart before the doctors have had a chance to review them. We understand that in some cases there may be results that are confusing or concerning to you. Please understand that not all results are received at the same time and often the doctors may need to interpret multiple results in order to provide you with the best plan of care or course of treatment. Therefore, we ask that you please give [place] 2 business days to thoroughly review all your results before contacting the office for clarification.  Should we see a critical lab result, you will be contacted sooner.   If You Need Anything After Your Visit  If you have any questions or concerns for your doctor, please call our main line at [phone] and press option 4 to reach your doctor's medical assistant. If no one answers, please leave a voicemail as directed and we will return your call as soon as possible. Messages left after 4 pm will be answered the following business day.   You may also send [place] a message via MyChart. We typically respond to MyChart  messages within 1-2 business days.  For prescription refills, please ask your pharmacy to contact our office. Our fax number is [phone].  If you have an urgent issue when the clinic is closed that cannot wait until the next business day, you can page your doctor at the number below.    Please note that while we do our best to be available for urgent issues outside of office hours, we are not available 24/7.   If you have an urgent issue and are unable to reach [place], you may choose to seek medical care at your doctor's office, retail clinic, urgent care center, or emergency room.  If you have a medical emergency, please immediately call 911 or go to the emergency department.  Pager Numbers  - Dr. Gwen Pounds: [phone]  - Dr. Neale Burly: [phone]  - Dr. Roseanne Reno: [phone]  In the event of inclement weather, please call our main line at [phone] for an update on the status of any delays or closures.  Dermatology Medication Tips: Please keep the boxes that topical medications come in in order to help keep track of the instructions about where and how to use these. Pharmacies typically print the medication instructions only on the boxes and not directly on the medication tubes.   If your medication is too expensive, please contact our office at [phone] option 4 or send [place] a message through MyChart.   We are unable to tell what your co-pay for medications will be in advance as this is different depending on your insurance coverage. However, we may be able to find a substitute medication at lower cost or fill out paperwork to get insurance to cover a needed medication.   If a prior authorization is required to get your medication covered by your insurance company, please allow [place] 1-2 business days to complete this process.  Drug prices often vary depending on where the prescription is filled and some pharmacies may offer cheaper prices.  The website www.goodrx.com contains  coupons for medications through different pharmacies. The prices here do not account for what the cost may be with help from insurance (it may be cheaper with your insurance), but the website can give you the price if you did not use any insurance.  - You can print the associated coupon and take it with your prescription to the pharmacy.  - You may also stop by our office during regular business hours and pick up a GoodRx coupon card.  - If you need your prescription sent electronically to a different pharmacy, notify our office through [facility] or by phone at [phone] option 4.     Si Usted Necesita Algo Despus de Su Visita  Tambin puede enviarnos un mensaje a travs de Clinical cytogeneticist. Por lo general respondemos a los mensajes de MyChart en el transcurso de 1 a 2 das hbiles.  Para renovar recetas, por favor pida a su farmacia que se ponga  en contacto con nuestra oficina. Annie Sable de fax es [place] [phone].  Si tiene un asunto urgente cuando la clnica est cerrada y que no puede esperar hasta el siguiente da hbil, puede llamar/localizar a su doctor(a) al nmero que aparece a continuacin.   Por favor, tenga en cuenta que aunque hacemos todo lo posible para estar disponibles para asuntos urgentes fuera del horario de [place], no estamos disponibles las 24 horas del da, los 7 [address]  [address] de la [place].   Si tiene un problema urgente y no puede comunicarse con nosotros, puede optar por buscar atencin mdica  en el consultorio de su doctor(a), en una clnica privada, en un centro de atencin urgente o en una sala de emergencias.  Si tiene Engineer, drilling, por favor llame inmediatamente al 911 o vaya a la sala de emergencias.  Nmeros de bper  - Dr. Gwen Pounds: [phone]  - Dra. Moye: [phone]  - Dra. Roseanne Reno: [phone]  En caso de inclemencias del [place], por favor llame a Lacy Duverney principal al [phone] para una actualizacin sobre el [place] de  cualquier retraso o cierre.  Consejos para la medicacin en dermatologa: Por favor, guarde las cajas en las que vienen los medicamentos de uso tpico para ayudarle a seguir las instrucciones sobre dnde y cmo usarlos. Las farmacias generalmente imprimen las instrucciones del medicamento slo en las cajas y no directamente en los tubos del [place].   Si su medicamento es muy caro, por favor, pngase en contacto con Rolm Gala llamando al [phone] y presione la opcin 4 o envenos un mensaje a travs de Clinical cytogeneticist.   No podemos decirle cul ser su copago por los medicamentos por adelantado ya que esto es diferente dependiendo de la cobertura de su seguro. Sin embargo, es posible que podamos encontrar un medicamento sustituto a Audiological scientist un formulario para que el seguro cubra el medicamento que se considera necesario.   Si se requiere una autorizacin previa para que su compaa de seguros [place] su medicamento, por favor permtanos de 1 a 2 das hbiles para completar [address]  Los precios de los medicamentos varan con frecuencia dependiendo del Environmental consultant de dnde se surte la receta y alguna farmacias pueden ofrecer precios ms baratos.  El sitio web www.goodrx.com tiene cupones para medicamentos de Health and safety inspector. Los precios aqu no tienen en cuenta lo que podra costar con la ayuda del seguro (puede ser ms barato con su seguro), pero el sitio web puede darle el precio si no utiliz Tourist information centre manager.  - Puede imprimir el cupn correspondiente y llevarlo con su receta a la farmacia.  - Tambin puede pasar por nuestra oficina durante el horario de atencin regular y Education officer, museum una tarjeta de cupones de GoodRx.  - Si necesita que su receta se enve electrnicamente a una farmacia diferente, informe a nuestra oficina a travs de MyChart de [place] o por telfono llamando al [phone] y presione la opcin 4.

## 2022-07-11 DIAGNOSIS — M353 Polymyalgia rheumatica: Secondary | ICD-10-CM | POA: Diagnosis not present

## 2022-07-11 DIAGNOSIS — E034 Atrophy of thyroid (acquired): Secondary | ICD-10-CM | POA: Diagnosis not present

## 2022-07-11 DIAGNOSIS — I2510 Atherosclerotic heart disease of native coronary artery without angina pectoris: Secondary | ICD-10-CM | POA: Diagnosis not present

## 2022-07-11 DIAGNOSIS — R7303 Prediabetes: Secondary | ICD-10-CM | POA: Diagnosis not present

## 2022-07-11 DIAGNOSIS — E7849 Other hyperlipidemia: Secondary | ICD-10-CM | POA: Diagnosis not present

## 2022-07-18 DIAGNOSIS — R0601 Orthopnea: Secondary | ICD-10-CM | POA: Diagnosis not present

## 2022-07-18 DIAGNOSIS — I2510 Atherosclerotic heart disease of native coronary artery without angina pectoris: Secondary | ICD-10-CM | POA: Diagnosis not present

## 2022-07-18 DIAGNOSIS — Z86711 Personal history of pulmonary embolism: Secondary | ICD-10-CM | POA: Diagnosis not present

## 2022-07-18 DIAGNOSIS — Z79899 Other long term (current) drug therapy: Secondary | ICD-10-CM | POA: Diagnosis not present

## 2022-07-18 DIAGNOSIS — D849 Immunodeficiency, unspecified: Secondary | ICD-10-CM | POA: Diagnosis not present

## 2022-07-18 DIAGNOSIS — R7303 Prediabetes: Secondary | ICD-10-CM | POA: Diagnosis not present

## 2022-07-18 DIAGNOSIS — M0609 Rheumatoid arthritis without rheumatoid factor, multiple sites: Secondary | ICD-10-CM | POA: Diagnosis not present

## 2022-07-18 DIAGNOSIS — M0600 Rheumatoid arthritis without rheumatoid factor, unspecified site: Secondary | ICD-10-CM | POA: Diagnosis not present

## 2022-07-18 DIAGNOSIS — R600 Localized edema: Secondary | ICD-10-CM | POA: Diagnosis not present

## 2022-07-18 DIAGNOSIS — E785 Hyperlipidemia, unspecified: Secondary | ICD-10-CM | POA: Diagnosis not present

## 2022-07-18 DIAGNOSIS — E039 Hypothyroidism, unspecified: Secondary | ICD-10-CM | POA: Diagnosis not present

## 2022-07-18 DIAGNOSIS — E559 Vitamin D deficiency, unspecified: Secondary | ICD-10-CM | POA: Diagnosis not present

## 2022-08-03 DIAGNOSIS — R748 Abnormal levels of other serum enzymes: Secondary | ICD-10-CM | POA: Diagnosis not present

## 2022-08-14 DIAGNOSIS — R0601 Orthopnea: Secondary | ICD-10-CM | POA: Diagnosis not present

## 2022-08-14 DIAGNOSIS — R600 Localized edema: Secondary | ICD-10-CM | POA: Diagnosis not present

## 2022-08-22 DIAGNOSIS — R899 Unspecified abnormal finding in specimens from other organs, systems and tissues: Secondary | ICD-10-CM | POA: Diagnosis not present

## 2022-08-28 DIAGNOSIS — J301 Allergic rhinitis due to pollen: Secondary | ICD-10-CM | POA: Diagnosis not present

## 2022-08-28 DIAGNOSIS — H903 Sensorineural hearing loss, bilateral: Secondary | ICD-10-CM | POA: Diagnosis not present

## 2022-08-28 DIAGNOSIS — H6121 Impacted cerumen, right ear: Secondary | ICD-10-CM | POA: Diagnosis not present

## 2022-09-05 DIAGNOSIS — M0609 Rheumatoid arthritis without rheumatoid factor, multiple sites: Secondary | ICD-10-CM | POA: Diagnosis not present

## 2022-09-05 DIAGNOSIS — Z79899 Other long term (current) drug therapy: Secondary | ICD-10-CM | POA: Diagnosis not present

## 2022-12-12 DIAGNOSIS — E559 Vitamin D deficiency, unspecified: Secondary | ICD-10-CM | POA: Diagnosis not present

## 2022-12-12 DIAGNOSIS — M8589 Other specified disorders of bone density and structure, multiple sites: Secondary | ICD-10-CM | POA: Diagnosis not present

## 2023-01-23 ENCOUNTER — Other Ambulatory Visit: Payer: Self-pay | Admitting: Internal Medicine

## 2023-01-23 DIAGNOSIS — Z1231 Encounter for screening mammogram for malignant neoplasm of breast: Secondary | ICD-10-CM

## 2023-01-29 IMAGING — RF DG MYELOGRAM LUMBAR
6 series · 6 of 6 positions shown · non-contrast
Comparison: Lumbar MRI 02/09/2020, 01/18/2019.

CLINICAL DATA: 69-year-old female with prior lumbar surgery, most
recently at L2-L3. Recurrent severe back pain radiating to the right
hip and knee.

EXAM:
LUMBAR MYELOGRAM
FLUOROSCOPY TIME:  0 minutes 18 seconds
PROCEDURE:
Lumbar puncture and intrathecal contrast administration were
performed by Dr. FRANCIS KAZEMI who will separately report for the
portion of the procedure. I personally supervised acquisition of the
myelogram images.
TECHNIQUE: Contiguous axial images were obtained through the Lumbar spine after
the intrathecal infusion of infusion. Coronal and sagittal
reconstructions were obtained of the axial image sets.

[Series 1: cp_standard · 0.25mm/px · 1 of 1 slices shown]
[im 1/1]
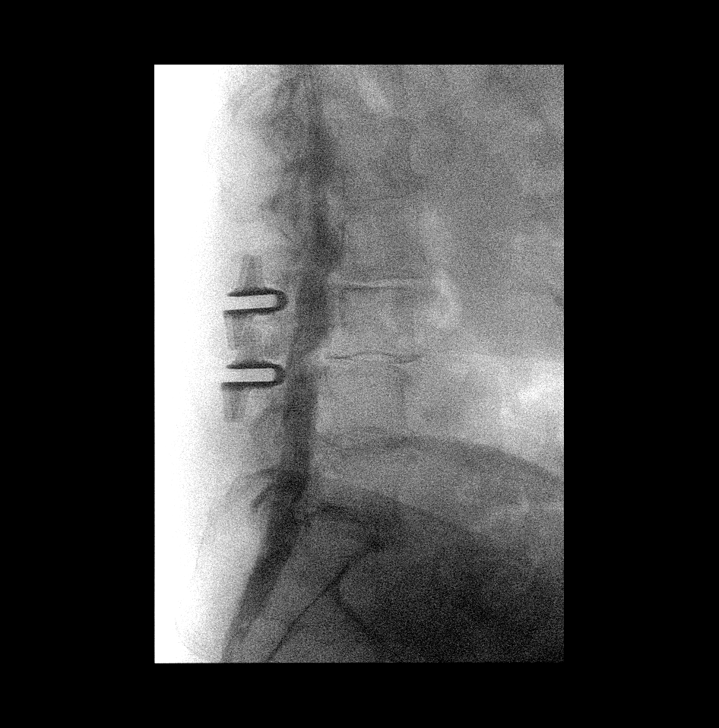

[Series 2: fluoro_myelogram_singleshot_bw · 0.20mm/px · 1 of 1 slices shown (1 of 5)]
[im 1/1]
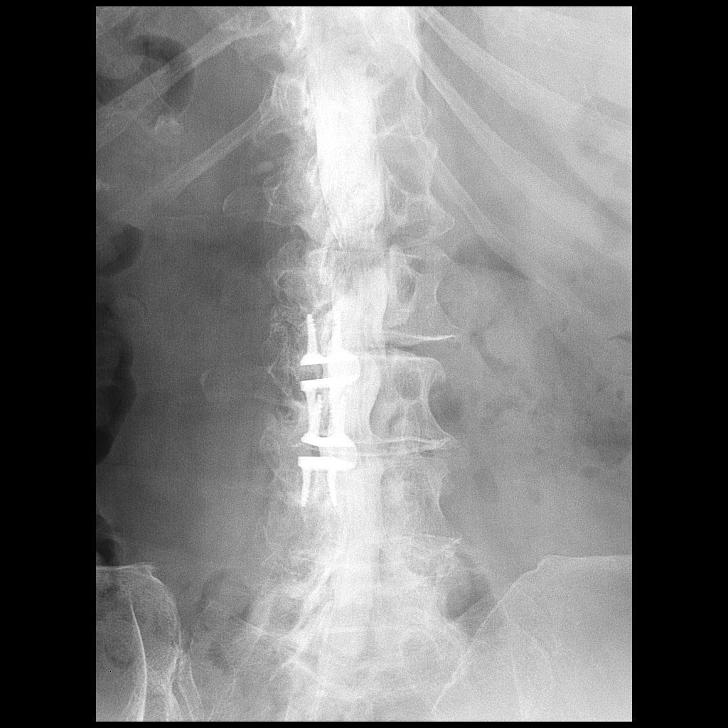

[Series 3: fluoro_myelogram_singleshot_bw · 0.20mm/px · 1 of 1 slices shown (2 of 5)]
[im 1/1]
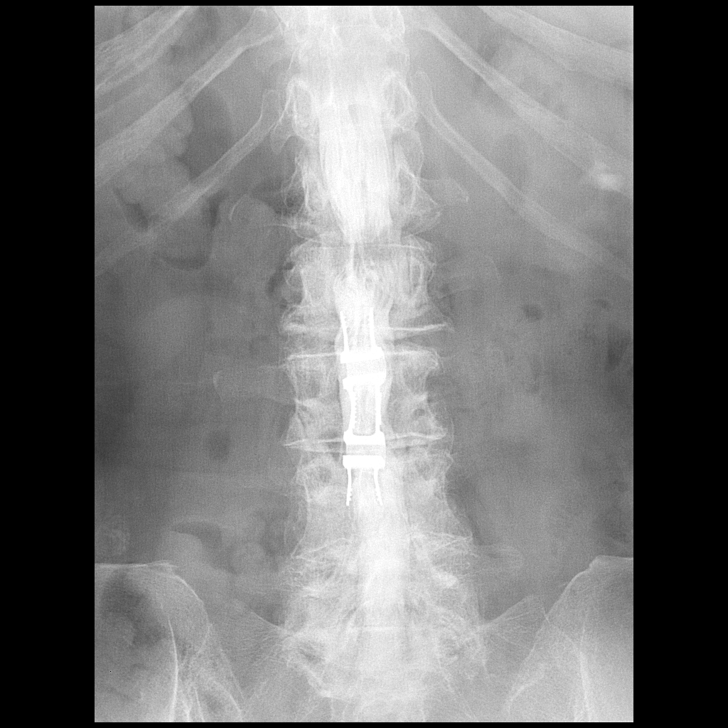

[Series 4: fluoro_myelogram_singleshot_bw · 0.20mm/px · 1 of 1 slices shown (3 of 5)]
[im 1/1]
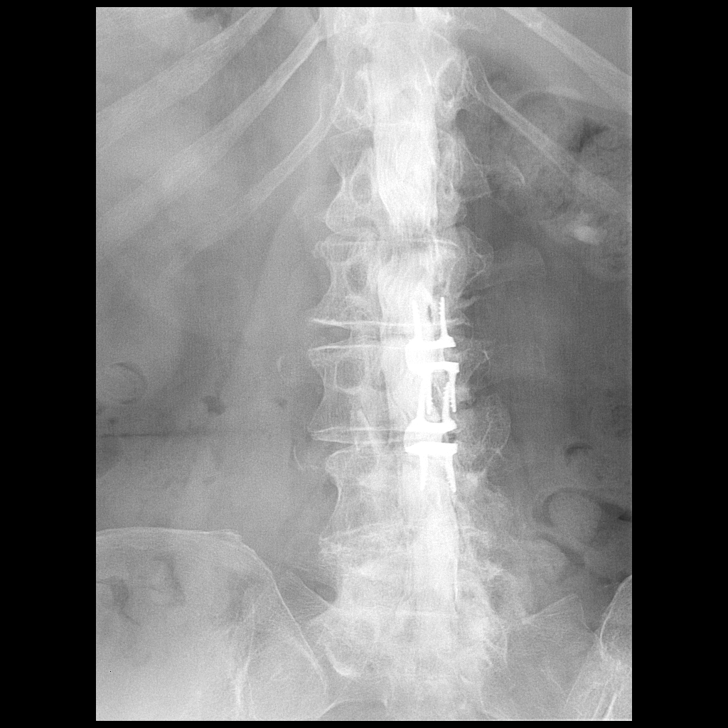

[Series 5: fluoro_myelogram_singleshot_bw · 0.18mm/px · 1 of 1 slices shown (4 of 5)]
[im 1/1]
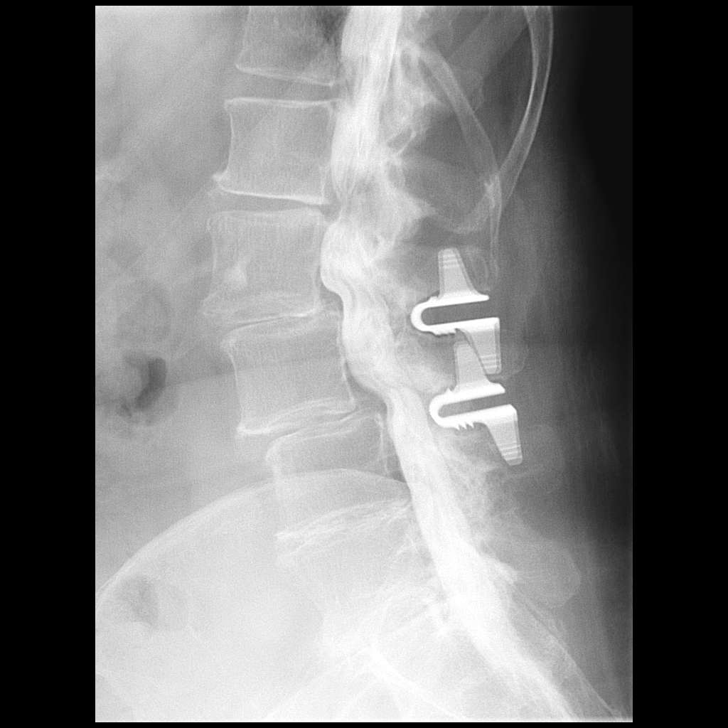

[Series 6: fluoro_myelogram_singleshot_bw · 0.18mm/px · 1 of 1 slices shown (5 of 5)]
[im 1/1]
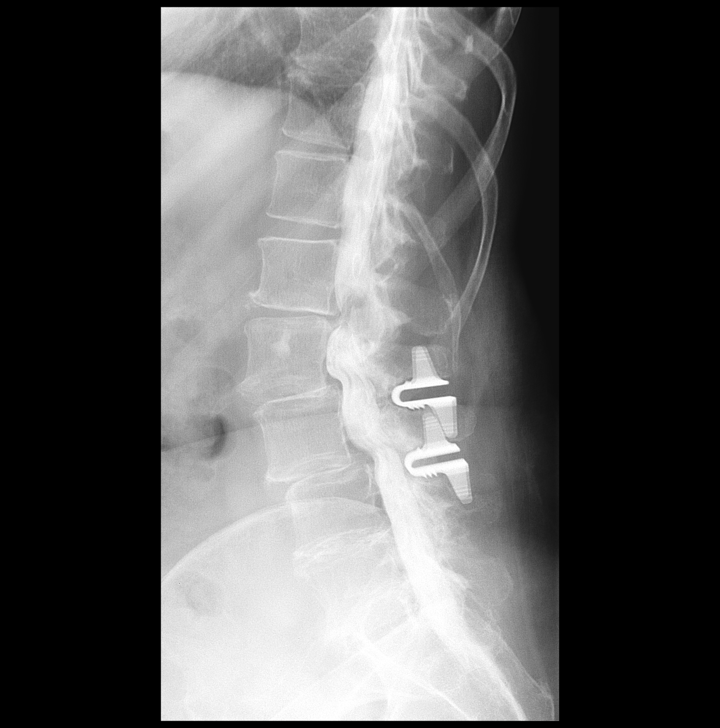

[6 of 6 positions shown; findings below may reference images not displayed]

FINDINGS: LUMBAR MYELOGRAM FINDINGS:

Normal lumbar segmentation, the same numbering system on the MRI
last year. Chronic spondylolisthesis at L2-L3 and L3-L4 does not
appear significantly changed. Chronic posterior interbody spacer
devices at both of those levels. Chronic disc space loss at L4-L5
and L5-S1.

Disc space loss at L1-L2 appears progressed since last year.

Good intrathecal contrast opacification. Lower thoracic thecal sac
patency appears normal through T12-L1. But at L1-L2 there is
significant effacement of contrast from the thecal sac on all AP,
oblique and lateral images suggesting significant spinal stenosis
and bilateral lateral recess stenosis at that level. This seems
progressed from the MRI last year.

Ventral defect on the thecal sac in part due to spondylolisthesis at
L2-L3 and L3-L4 is probably stable. Normal thecal sac patency from
L4 to the sacrum.

CT LUMBAR MYELOGRAM FINDINGS:

Normal lumbar segmentation. Anterolisthesis measuring 3-4 mm at
L2-L3 and L3-L4 appears stable since last year. Posterior
interspinous implants again noted at those levels. Stable lumbar
lordosis elsewhere. Mild straightening of the visible thoracic
spine.

Diffuse osteopenia. No acute osseous abnormality identified. Intact
visible sacrum and SI joints.

Right nephrolithiasis. Mild atelectasis at both costophrenic angles.
Subtle, small gastric hiatal hernia. Otherwise negative visible
noncontrast abdominal viscera. Mild diverticulosis of the sigmoid
colon in the pelvis.

Postoperative changes to the lumbar paraspinal soft tissues with no
adverse features.

Good intrathecal contrast opacification. Normal myelographic
appearance of the lower thoracic spinal cord with conus at T12-L1.
Normal spinal canal patency at those levels.

L1-L2: Effaced contrast from the thecal sac on bone window images
(series 4, image 72 with significant redundancy of cauda equina
nerve roots above that level suggesting significant multifactorial
spinal stenosis, moderate. The degree of stenosis is stable from the
MRI earlier this month and substantially progressed from 3438, along
with new nerve root redundancy above the stenosis. There is
underlying left eccentric bulky circumferential disc bulge and
moderate facet and ligament flavum hypertrophy at this level. Severe
left and mild to moderate right lateral recess stenosis at the L2
nerve levels, progressed on the left and new on the right from 3438.
Severe left L1 neural foraminal stenosis is stable from earlier this
month, progressed from last year. Mild right L1 foraminal stenosis
appears stable.

L2-L3: Interspinous spacer with no evidence of hardware loosening.
Chronic spondylolisthesis and vacuum disc. Mild circumferential disc
bulge and moderate residual posterior element hypertrophy. No
significant spinal stenosis. Mild to moderate bilateral foraminal
stenosis appears chronic and stable.

L3-L4: Chronic interspinous spacer without hardware loosening.
Chronic spondylolisthesis and vacuum disc. Progressed broad-based
left paracentral disc protrusion as seen on the MRI earlier this
month, most affecting the left lateral recess although stenosis
there at the left L4 nerve level - although mild. Severe facet
arthropathy at this level.

Furthermore, subtle effacement of the right lateral recess just
above the disc space level on series 5, image 97 is correlated with
a subtle 5 mm synovial cyst on the recent MRI (series 7, image 20)
which is new since 3438.

Moderate bilateral L3 foraminal stenosis appears progressed since
3438, stable from the recent MRI.

L4-L5: Prior posterior decompression. Solid posterior element
arthrodesis suspected. This level is stable since 3438 without
stenosis.

L5-S1: Possible prior posterior decompression at this level. Chronic
severe disc space loss and vacuum disc. No arthrodesis. Stable
circumferential disc osteophyte complex eccentric to the left.
Moderate facet and calcified ligament flavum hypertrophy. No spinal
stenosis. Mild left lateral recess stenosis is stable. Mild to
moderate L5 foraminal stenosis appears stable since 3438.
IMPRESSION: 1. Substantial progression of L1-L2 degeneration and subsequent
multifactorial spinal stenosis since 3438, stable from MRI earlier
this month.

2. But with regard to new right side radiating pain, the symptomatic
level is favored to be L3-L4, where chronic anterolisthesis and
severe facet arthropathy is associated with new right side synovial
cyst, best seen on series 7, image 20 on the MRI 02/09/2020. Query
right L4 radiculitis.

3. Other lumbar levels appear stable since 3438, including L2-L3
spondylolisthesis, interbody spacer and prior decompression at L4-L5
where there is solid posterior element arthrodesis.

4. Chronic right nephrolithiasis.  Subtle hiatal hernia.

Salient findings discussed by telephone with Dr. FRANCIS KAZEMI on

## 2023-03-09 IMAGING — RF DG LUMBAR SPINE 2-3V
1 series · 3 of 3 positions shown · non-contrast
Comparison: CT 02/26/2020.

CLINICAL DATA: Lumbar fusion.

EXAM:
LUMBAR SPINE - 2-3 VIEW; DG C-ARM 1-60 MIN

[Series 1: run · 3 of 3 slices shown]
[im 1/3]
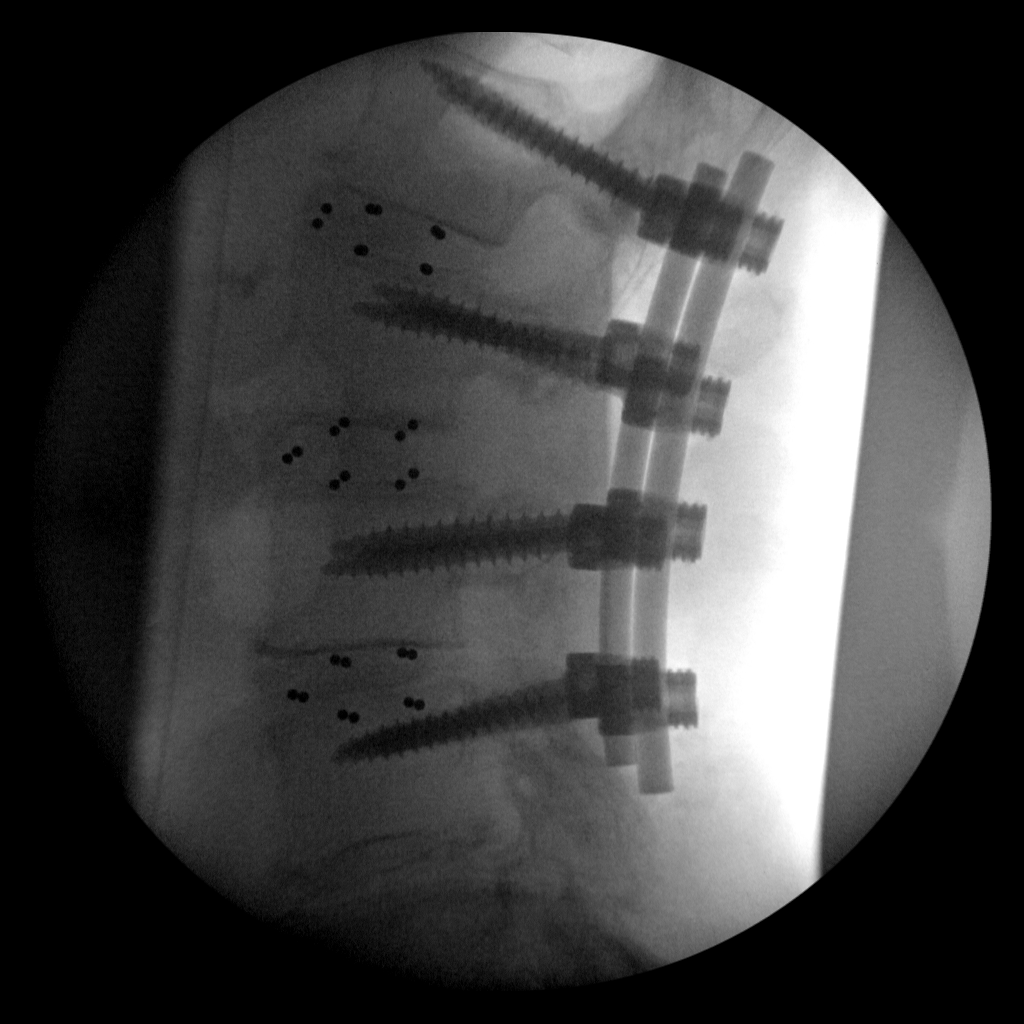
[im 2/3]
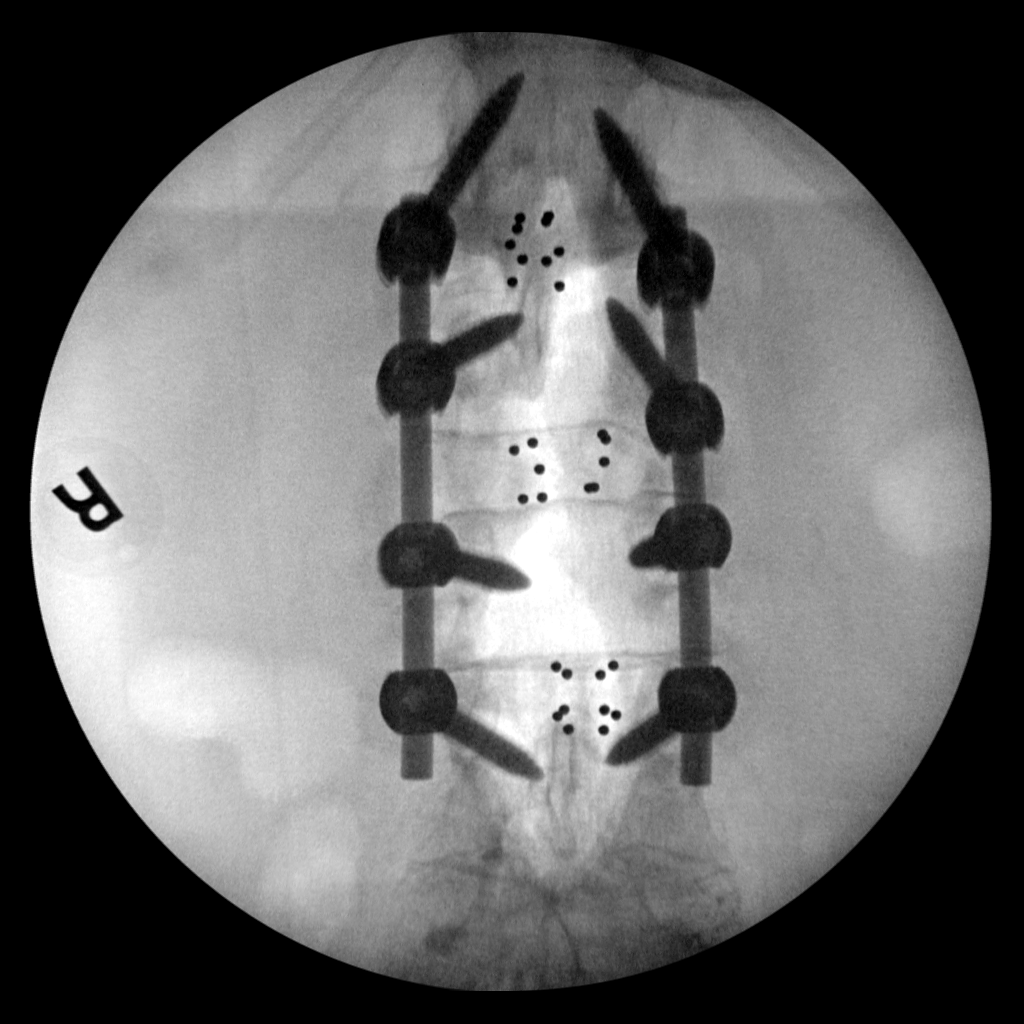
[im 3/3]
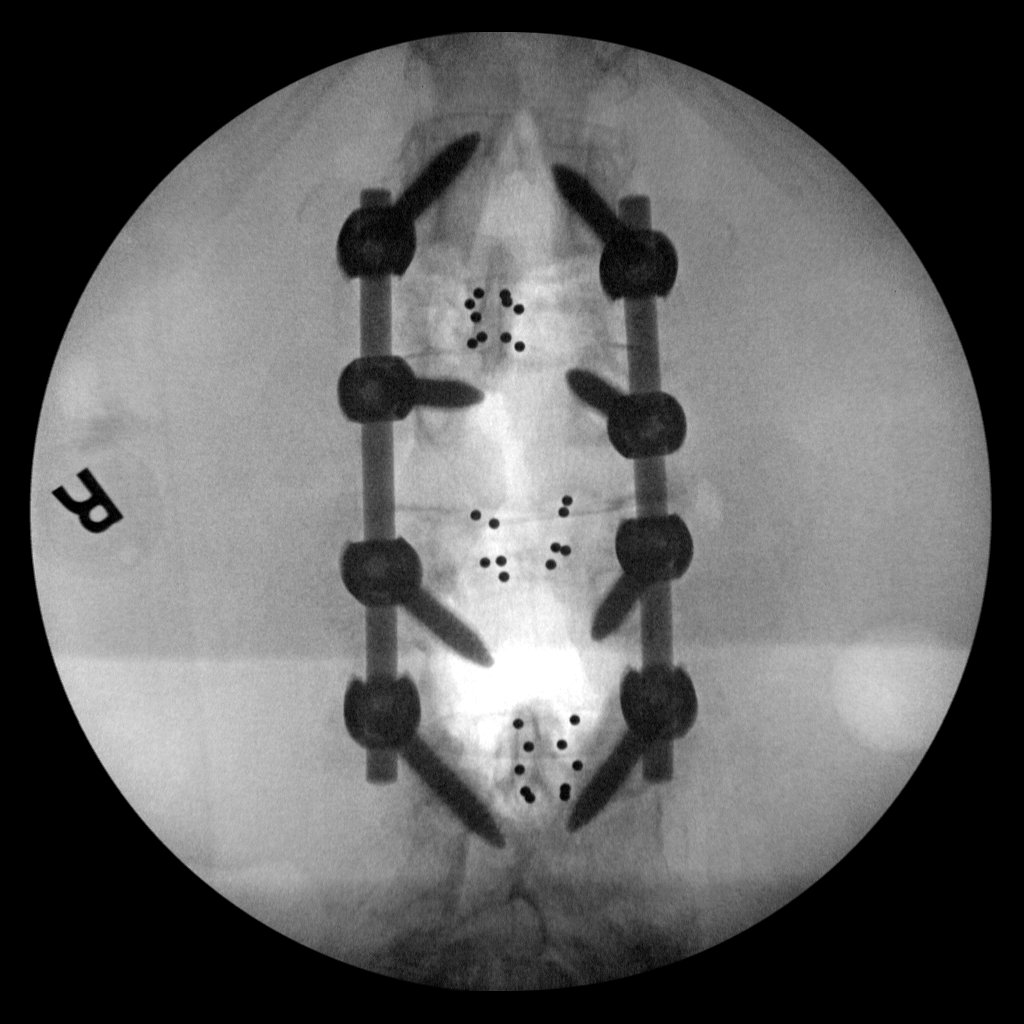

[3 of 3 positions shown; findings below may reference images not displayed]

FINDINGS: Lumbar spine numbered as per prior CT. C1 through C4 posterior
interbody fusion. Hardware intact. Anatomic alignment. 0 minutes 45
seconds fluoroscopy time. 35.7 mGy radiation dose.
IMPRESSION: C1 through C4 posterior interbody fusion. Hardware intact. Anatomic
alignment.

## 2023-03-11 ENCOUNTER — Ambulatory Visit
Admission: RE | Admit: 2023-03-11 | Discharge: 2023-03-11 | Disposition: A | Discharge status: Home / Self Care | Payer: Medicare HMO | Source: Ambulatory Visit | Attending: Internal Medicine | Admitting: Internal Medicine

## 2023-03-11 DIAGNOSIS — Z1231 Encounter for screening mammogram for malignant neoplasm of breast: Secondary | ICD-10-CM | POA: Insufficient documentation

## 2023-06-25 ENCOUNTER — Encounter: Payer: Self-pay | Admitting: Dermatology

## 2023-06-25 ENCOUNTER — Ambulatory Visit: Payer: Medicare HMO | Admitting: Dermatology

## 2023-06-25 DIAGNOSIS — D2239 Melanocytic nevi of other parts of face: Secondary | ICD-10-CM

## 2023-06-25 DIAGNOSIS — L570 Actinic keratosis: Secondary | ICD-10-CM

## 2023-06-25 DIAGNOSIS — Q825 Congenital non-neoplastic nevus: Secondary | ICD-10-CM

## 2023-06-25 DIAGNOSIS — Q828 Other specified congenital malformations of skin: Secondary | ICD-10-CM

## 2023-06-25 DIAGNOSIS — L814 Other melanin hyperpigmentation: Secondary | ICD-10-CM

## 2023-06-25 DIAGNOSIS — D225 Melanocytic nevi of trunk: Secondary | ICD-10-CM

## 2023-06-25 DIAGNOSIS — W908XXA Exposure to other nonionizing radiation, initial encounter: Secondary | ICD-10-CM | POA: Diagnosis not present

## 2023-06-25 DIAGNOSIS — D229 Melanocytic nevi, unspecified: Secondary | ICD-10-CM

## 2023-06-25 DIAGNOSIS — L578 Other skin changes due to chronic exposure to nonionizing radiation: Secondary | ICD-10-CM

## 2023-06-25 DIAGNOSIS — Z1283 Encounter for screening for malignant neoplasm of skin: Secondary | ICD-10-CM

## 2023-06-25 DIAGNOSIS — D492 Neoplasm of unspecified behavior of bone, soft tissue, and skin: Secondary | ICD-10-CM

## 2023-06-25 DIAGNOSIS — Z85828 Personal history of other malignant neoplasm of skin: Secondary | ICD-10-CM

## 2023-06-25 DIAGNOSIS — D1801 Hemangioma of skin and subcutaneous tissue: Secondary | ICD-10-CM

## 2023-06-25 DIAGNOSIS — L821 Other seborrheic keratosis: Secondary | ICD-10-CM

## 2023-06-25 NOTE — Progress Notes (Signed)
 Follow-Up Visit   Subjective  Karen Ramirez is a 73 y.o. female who presents for the following: Skin Cancer Screening and Full Body Skin Exam  Patient with hx of BCC. Patient with a scaly place at left temple, present for about 3-4 months. Thinks spot on leg is growing and stays scaly.  The patient presents for Total-Body Skin Exam (TBSE) for skin cancer screening and mole check. The patient has spots, moles and lesions to be evaluated, some may be new or changing and the patient may have concern these could be cancer.   The following portions of the chart were reviewed this encounter and updated as appropriate: medications, allergies, medical history  Review of Systems:  No other skin or systemic complaints except as noted in HPI or Assessment and Plan.  Objective  Well appearing patient in no apparent distress; mood and affect are within normal limits.  A full examination was performed including scalp, head, eyes, ears, nose, lips, neck, chest, axillae, abdomen, back, buttocks, bilateral upper extremities, bilateral lower extremities, hands, feet, fingers, toes, fingernails, and toenails. All findings within normal limits unless otherwise noted below.   Relevant physical exam findings are noted in the Assessment and Plan.  Left Temple x1, R lower cheek x1 (2) Pink scaly macules Left lower medial leg above ankle 1.5 x 1 cm pink, waxy thin papule with keratotic rim   Assessment & Plan   SKIN CANCER SCREENING PERFORMED TODAY.  ACTINIC DAMAGE - Chronic condition, secondary to cumulative UV/sun exposure - diffuse scaly erythematous macules with underlying dyspigmentation - Recommend daily broad spectrum sunscreen SPF 30+ to sun-exposed areas, reapply every 2 hours as needed.  - Staying in the shade or wearing long sleeves, sun glasses (UVA+UVB protection) and wide brim hats (4-inch brim around the entire circumference of the hat) are also recommended for sun protection.  -  Call for new or changing lesions.  LENTIGINES, SEBORRHEIC KERATOSES, HEMANGIOMAS - Benign normal skin lesions - Benign-appearing - Call for any changes  MELANOCYTIC NEVI - Tan-brown and/or pink-flesh-colored symmetric macules and papules - 0.6 x 0.2 cm brown macule, darker edge at right lower back medial  - 0.7 x 0.3 cm medium brown macule at right lower back lateral - 4 mm pink, flesh papule at R mandible - Benign appearing on exam today - Observation - Call clinic for new or changing moles - Recommend daily use of broad spectrum spf 30+ sunscreen to sun-exposed areas.   Nevus Flammeus Exam: Pink patch at occipital scalp   Treatment plan: Benign, observe.    HISTORY OF BASAL CELL CARCINOMA OF THE SKIN  Left clavicle- 12/07/20, [facility] - No evidence of recurrence today - Recommend regular full body skin exams - Recommend daily broad spectrum sunscreen SPF 30+ to sun-exposed areas, reapply every 2 hours as needed.  - Call if any new or changing lesions are noted between office visits  SK vs Telangiectasias Exam: 3 x 2 cm waxy pink patch at left upper calf   Treatment plan: Benign-appearing.  Stable. Observation.  Call clinic for new or changing lesions.  Recommend daily use of broad spectrum spf 30+ sunscreen to sun-exposed areas.     AK (ACTINIC KERATOSIS) (2) Left Temple x1, R lower cheek x1 (2) Actinic keratoses are precancerous spots that appear secondary to cumulative UV radiation exposure/sun exposure over time. They are chronic with expected duration over 1 year. A portion of actinic keratoses will progress to squamous cell carcinoma of the skin. It is  not possible to reliably predict which spots will progress to skin cancer and so treatment is recommended to prevent development of skin cancer.  Recommend daily broad spectrum sunscreen SPF 30+ to sun-exposed areas, reapply every 2 hours as needed.  Recommend staying in the shade or wearing long sleeves, sun glasses (UVA+UVB  protection) and wide brim hats (4-inch brim around the entire circumference of the hat). Call for new or changing lesions. Destruction of lesion - Left Temple x1, R lower cheek x1 (2)  Destruction method: cryotherapy   Informed consent: discussed and consent obtained   Lesion destroyed using liquid nitrogen: Yes   Region frozen until ice ball extended beyond lesion: Yes   Outcome: patient tolerated procedure well with no complications   Post-procedure details: wound care instructions given   Additional details:  Prior to procedure, discussed risks of blister formation, small wound, skin dyspigmentation, or rare scar following cryotherapy. Recommend Vaseline ointment to treated areas while healing.  NEOPLASM OF SKIN Left lower medial leg above ankle Skin / nail biopsy Type of biopsy: tangential   Informed consent: discussed and consent obtained   Anesthesia: the lesion was anesthetized in a standard fashion   Anesthesia comment:  Area prepped with alcohol  Anesthetic:  1% lidocaine  w/ epinephrine  1-100,000 buffered w/ 8.4% NaHCO3 Instrument used: flexible razor blade   Hemostasis achieved with: pressure, aluminum chloride and electrodesiccation   Outcome: patient tolerated procedure well   Post-procedure details: wound care instructions given   Post-procedure details comment:  Ointment and small bandage applied Specimen 1 - Surgical pathology Differential Diagnosis: Porokeratosis R/O SCCIS  Check Margins: No 1.5 x 1 cm pink, waxy thin papule with keratotic rim Discussed that healing will take some time to heal due to swelling at lower legs, may take a couple months to heal. Recommend wearing compression stockings to help speed healing process.  SKIN CANCER SCREENING   ACTINIC SKIN DAMAGE   LENTIGO   SEBORRHEIC KERATOSIS   HEMANGIOMA OF SKIN   NEVUS   NEVUS FLAMMEUS   HISTORY OF BASAL CELL CARCINOMA (BCC) OF SKIN     Return in about 1 year (around 06/24/2024) for  w/ Dr. Jackquline, TBSE, H[facility].  I, Jacquelynn V. Wilfred, CMA, am acting as scribe for Rexene Jackquline, MD .   Documentation: I have reviewed the above documentation for accuracy and completeness, and I agree with the above.  Rexene Jackquline, MD

## 2023-06-25 NOTE — Patient Instructions (Addendum)
Cryotherapy Aftercare  Wash gently with soap and water everyday.   Apply Vaseline and Band-Aid daily until healed.   Wound Care Instructions  Cleanse wound gently with soap and water once a day then pat dry with clean gauze. Apply a thin coat of Petrolatum (petroleum jelly, "Vaseline") over the wound (unless you have an allergy to this). We recommend that you use a new, sterile tube of Vaseline. Do not pick or remove scabs. Do not remove the yellow or white "healing tissue" from the base of the wound.  Cover the wound with fresh, clean, nonstick gauze and secure with paper tape. You may use Band-Aids in place of gauze and tape if the wound is small enough, but would recommend trimming much of the tape off as there is often too much. Sometimes Band-Aids can irritate the skin.  You should call the office for your biopsy report after 1 week if you have not already been contacted.  If you experience any problems, such as abnormal amounts of bleeding, swelling, significant bruising, significant pain, or evidence of infection, please call the office immediately.  FOR ADULT SURGERY PATIENTS: If you need something for pain relief you may take 1 extra strength Tylenol (acetaminophen) AND 2 Ibuprofen (200mg  each) together every 4 hours as needed for pain. (do not take these if you are allergic to them or if you have a reason you should not take them.) Typically, you may only need pain medication for 1 to 3 days.      Recommend daily broad spectrum sunscreen SPF 30+ to sun-exposed areas, reapply every 2 hours as needed. Call for new or changing lesions.  Staying in the shade or wearing long sleeves, sun glasses (UVA+UVB protection) and wide brim hats (4-inch brim around the entire circumference of the hat) are also recommended for sun protection.    Melanoma ABCDEs  Melanoma is the most dangerous type of skin cancer, and is the leading cause of death from skin disease.  You are more likely to develop  melanoma if you: Have light-colored skin, light-colored eyes, or red or blond hair Spend a lot of time in the sun Tan regularly, either outdoors or in a tanning bed Have had blistering sunburns, especially during childhood Have a close family member who has had a melanoma Have atypical moles or large birthmarks  Early detection of melanoma is key since treatment is typically straightforward and cure rates are extremely high if we catch it early.   The first sign of melanoma is often a change in a mole or a new dark spot.  The ABCDE system is a way of remembering the signs of melanoma.  A for asymmetry:  The two halves do not match. B for border:  The edges of the growth are irregular. C for color:  A mixture of colors are present instead of an even brown color. D for diameter:  Melanomas are usually (but not always) greater than 6mm - the size of a pencil eraser. E for evolution:  The spot keeps changing in size, shape, and color.  Please check your skin once per month between visits. You can use a small mirror in front and a large mirror behind you to keep an eye on the back side or your body.   If you see any new or changing lesions before your next follow-up, please call to schedule a visit.  Please continue daily skin protection including broad spectrum sunscreen SPF 30+ to sun-exposed areas, reapplying every 2 hours  as needed when you're outdoors.   Staying in the shade or wearing long sleeves, sun glasses (UVA+UVB protection) and wide brim hats (4-inch brim around the entire circumference of the hat) are also recommended for sun protection.    Due to recent changes in healthcare laws, you may see results of your pathology and/or laboratory studies on MyChart before the doctors have had a chance to review them. We understand that in some cases there may be results that are confusing or concerning to you. Please understand that not all results are received at the same time and often the  doctors may need to interpret multiple results in order to provide you with the best plan of care or course of treatment. Therefore, we ask that you please give [place] 2 business days to thoroughly review all your results before contacting the office for clarification. Should we see a critical lab result, you will be contacted sooner.   If You Need Anything After Your Visit  If you have any questions or concerns for your doctor, please call our main line at [phone] and press option 4 to reach your doctor's medical assistant. If no one answers, please leave a voicemail as directed and we will return your call as soon as possible. Messages left after 4 pm will be answered the following business day.   You may also send [place] a message via MyChart. We typically respond to MyChart messages within 1-2 business days.  For prescription refills, please ask your pharmacy to contact our office. Our fax number is [phone].  If you have an urgent issue when the clinic is closed that cannot wait until the next business day, you can page your doctor at the number below.    Please note that while we do our best to be available for urgent issues outside of office hours, we are not available 24/7.   If you have an urgent issue and are unable to reach [place], you may choose to seek medical care at your doctor's office, retail clinic, urgent care center, or emergency room.  If you have a medical emergency, please immediately call 911 or go to the emergency department.  Pager Numbers  - Dr. Gwen Pounds: [phone]  - Dr. Roseanne Reno: [phone]  - Dr. Katrinka Blazing: [phone]   In the event of inclement weather, please call our main line at [phone] for an update on the status of any delays or closures.  Dermatology Medication Tips: Please keep the boxes that topical medications come in in order to help keep track of the instructions about where and how to use these. Pharmacies typically print the medication  instructions only on the boxes and not directly on the medication tubes.   If your medication is too expensive, please contact our office at [phone] option 4 or send [place] a message through MyChart.   We are unable to tell what your co-pay for medications will be in advance as this is different depending on your insurance coverage. However, we may be able to find a substitute medication at lower cost or fill out paperwork to get insurance to cover a needed medication.   If a prior authorization is required to get your medication covered by your insurance company, please allow [place] 1-2 business days to complete this process.  Drug prices often vary depending on where the prescription is filled and some pharmacies may offer cheaper prices.  The website www.goodrx.com contains coupons for medications through different pharmacies. The prices here do not account for  what the cost may be with help from insurance (it may be cheaper with your insurance), but the website can give you the price if you did not use any insurance.  - You can print the associated coupon and take it with your prescription to the pharmacy.  - You may also stop by our office during regular business hours and pick up a GoodRx coupon card.  - If you need your prescription sent electronically to a different pharmacy, notify our office through [facility] or by phone at [phone] option 4.     Si Usted Necesita Algo Despus de Su Visita  Tambin puede enviarnos un mensaje a travs de Clinical cytogeneticist. Por lo general respondemos a los mensajes de MyChart en el transcurso de 1 a 2 das hbiles.  Para renovar recetas, por favor pida a su farmacia que se ponga en contacto con nuestra oficina. Annie Sable de fax es [place] [phone].  Si tiene un asunto urgente cuando la clnica est cerrada y que no puede esperar hasta el siguiente da hbil, puede llamar/localizar a su doctor(a) al nmero que aparece a continuacin.   Por  favor, tenga en cuenta que aunque hacemos todo lo posible para estar disponibles para asuntos urgentes fuera del horario de [place], no estamos disponibles las 24 horas del da, los 7 [address]  [address] de la [place].   Si tiene un problema urgente y no puede comunicarse con nosotros, puede optar por buscar atencin mdica  en el consultorio de su doctor(a), en una clnica privada, en un centro de atencin urgente o en una sala de emergencias.  Si tiene Engineer, drilling, por favor llame inmediatamente al 911 o vaya a la sala de emergencias.  Nmeros de bper  - Dr. Gwen Pounds: [phone]  - Dra. Roseanne Reno: [phone]  - Dr. Katrinka Blazing: [phone]   En caso de inclemencias del tiempo, por favor llame a Lacy Duverney principal al [phone] para una actualizacin sobre el [place] de cualquier retraso o cierre.  Consejos para la medicacin en dermatologa: Por favor, guarde las cajas en las que vienen los medicamentos de uso tpico para ayudarle a seguir las instrucciones sobre dnde y cmo usarlos. Las farmacias generalmente imprimen las instrucciones del medicamento slo en las cajas y no directamente en los tubos del [place].   Si su medicamento es muy caro, por favor, pngase en contacto con Rolm Gala llamando al [phone] y presione la opcin 4 o envenos un mensaje a travs de Clinical cytogeneticist.   No podemos decirle cul ser su copago por los medicamentos por adelantado ya que esto es diferente dependiendo de la cobertura de su seguro. Sin embargo, es posible que podamos encontrar un medicamento sustituto a Audiological scientist un formulario para que el seguro cubra el medicamento que se considera necesario.   Si se requiere una autorizacin previa para que su compaa de seguros [place] su medicamento, por favor permtanos de 1 a 2 das hbiles para completar [address]  Los precios de los medicamentos varan con frecuencia dependiendo del Environmental consultant de dnde se surte la receta y alguna farmacias  pueden ofrecer precios ms baratos.  El sitio web www.goodrx.com tiene cupones para medicamentos de Health and safety inspector. Los precios aqu no tienen en cuenta lo que podra costar con la ayuda del seguro (puede ser ms barato con su seguro), pero el sitio web puede darle el precio si no utiliz Tourist information centre manager.  - Puede imprimir el cupn correspondiente y llevarlo con su receta a la farmacia.  Laroy Apple  puede pasar por nuestra oficina durante el horario de atencin regular y Education officer, museum una tarjeta de cupones de GoodRx.  - Si necesita que su receta se enve electrnicamente a una farmacia diferente, informe a nuestra oficina a travs de MyChart de [place] o por telfono llamando al [phone] y presione la opcin 4.

## 2023-06-26 LAB — SURGICAL PATHOLOGY

## 2023-07-01 ENCOUNTER — Ambulatory Visit: Payer: Self-pay | Admitting: Dermatology

## 2023-07-01 ENCOUNTER — Ambulatory Visit: Admitting: Dermatology

## 2023-07-01 NOTE — Telephone Encounter (Signed)
-----   Message from Rexene Rattler sent at 07/01/2023  8:37 AM EDT ----- 1. Skin, left lower medial leg above ankle :       SUPERFICIAL ACTINIC POROKERATOSIS   Benign porokeratosis, may continue observation or can treat residual lesion with cryotherapy if pt desires- please call patient ----- Message ----- From: Interface, Lab In Three Zero Seven Sent: 06/26/2023   7:37 PM EDT To: Rexene Rattler, MD

## 2023-07-01 NOTE — Telephone Encounter (Signed)
 Discussed biopsy results with patient

## 2023-07-29 ENCOUNTER — Ambulatory Visit: Admitting: Dermatology

## 2023-07-29 DIAGNOSIS — Q828 Other specified congenital malformations of skin: Secondary | ICD-10-CM

## 2023-07-29 DIAGNOSIS — D492 Neoplasm of unspecified behavior of bone, soft tissue, and skin: Secondary | ICD-10-CM | POA: Diagnosis not present

## 2023-07-29 DIAGNOSIS — D3611 Benign neoplasm of peripheral nerves and autonomic nervous system of face, head, and neck: Secondary | ICD-10-CM

## 2023-07-29 DIAGNOSIS — L814 Other melanin hyperpigmentation: Secondary | ICD-10-CM | POA: Diagnosis not present

## 2023-07-29 DIAGNOSIS — D485 Neoplasm of uncertain behavior of skin: Secondary | ICD-10-CM

## 2023-07-29 NOTE — Progress Notes (Signed)
   Follow-Up Visit   Subjective  Karen Ramirez is a 73 y.o. 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 Bothersome to patient. Also recheck nevus at right mandible, present for around 2 years. Unsure if area has changed.   The patient has spots, moles and lesions to be evaluated, some may be new or changing.   The following portions of the chart were reviewed this encounter and updated as appropriate: medications, allergies, medical history  Review of Systems:  No other skin or systemic complaints except as noted in HPI or Assessment and Plan.  Objective  Well appearing patient in no apparent distress; mood and affect are within normal limits.  A focused examination was performed of the following areas: Face, left leg Relevant physical exam findings are noted in the Assessment and Plan.  Right Mandible 5.0 mm pink flesh papule   Assessment & Plan   LENTIGINES Exam: scattered tan macules face Due to sun exposure Treatment Plan: Benign-appearing, observe. Recommend daily broad spectrum sunscreen SPF 30+ to sun-exposed areas, reapply every 2 hours as needed.  Call for any changes    SUPERFICIAL ACTINIC POROKERATOSIS, biopsy proven   Exam: left lower medial leg above ankle  Symptomatic, irritating, patient would like treated.   Treatment:  Destruction Procedure Note Destruction method: cryotherapy   Informed consent: discussed and consent obtained   Lesion destroyed using liquid nitrogen: Yes   Outcome: patient tolerated procedure well with no complications   Post-procedure details: wound care instructions given   Locations: left lower medial leg above ankle # of Lesions Treated: 1  Prior to procedure, discussed risks of blister formation, small wound, skin dyspigmentation, or rare scar following cryotherapy. Recommend Vaseline ointment to treated areas while healing.   NEOPLASM OF  UNCERTAIN BEHAVIOR OF SKIN Right Mandible Epidermal / dermal shaving  Lesion diameter (cm):  0.5 Informed consent: discussed and consent obtained   Patient was prepped and draped in usual sterile fashion: Area prepped with alcohol . Anesthesia: the lesion was anesthetized in a standard fashion   Anesthetic:  1% lidocaine  w/ epinephrine  1-100,000 buffered w/ 8.4% NaHCO3 Instrument used: flexible razor blade   Hemostasis achieved with: pressure, aluminum chloride and electrodesiccation   Outcome: patient tolerated procedure well   Post-procedure details: wound care instructions given   Post-procedure details comment:  Ointment and small bandage applied  Specimen 1 - Surgical pathology Differential Diagnosis: Irritated Nevus r/o BCC Check Margins: No Slightly bigger from previous exam.    Return as scheduled July 2026, for TBSE.  IAndrea Kerns, CMA, am acting as scribe for Rexene Rattler, MD .   Documentation: I have reviewed the above documentation for accuracy and completeness, and I agree with the above.  Rexene Rattler, MD

## 2023-07-29 NOTE — Patient Instructions (Addendum)

## 2023-08-01 LAB — SURGICAL PATHOLOGY

## 2023-08-07 ENCOUNTER — Ambulatory Visit: Payer: Self-pay | Admitting: Dermatology

## 2023-08-07 NOTE — Telephone Encounter (Signed)
-----   Message from Rexene Rattler sent at 08/07/2023 12:32 PM EDT ----- 1. Skin, right mandible :       NEUROFIBROMA, IRRITATED  benign  - please call patient ----- Message ----- From: Interface, Lab In Three Zero One Sent: 08/01/2023   6:36 PM EDT To: Rexene Rattler, MD

## 2023-08-07 NOTE — Telephone Encounter (Signed)
 Patient advised of BX results. aw

## 2023-08-07 NOTE — Telephone Encounter (Signed)
Left message for patient to call for biopsy results. 

## 2024-01-28 ENCOUNTER — Other Ambulatory Visit: Payer: Self-pay | Admitting: Internal Medicine

## 2024-01-28 DIAGNOSIS — Z1231 Encounter for screening mammogram for malignant neoplasm of breast: Secondary | ICD-10-CM

## 2024-03-11 ENCOUNTER — Encounter

## 2024-07-07 ENCOUNTER — Encounter: Admitting: Dermatology

## 2024-07-14 ENCOUNTER — Encounter: Admitting: Dermatology
